# Patient Record
Sex: Male | Born: 1965 | Hispanic: No | Marital: Single | Smoking: Current every day smoker
Health system: Southern US, Community
[De-identification: ages and names within clinical notes are randomized; demographics above are authoritative.]

## PROBLEM LIST (undated history)

## (undated) DIAGNOSIS — J45909 Unspecified asthma, uncomplicated: Secondary | ICD-10-CM

## (undated) DIAGNOSIS — I10 Essential (primary) hypertension: Secondary | ICD-10-CM

## (undated) DIAGNOSIS — S069XAA Unspecified intracranial injury with loss of consciousness status unknown, initial encounter: Secondary | ICD-10-CM

## (undated) DIAGNOSIS — F1010 Alcohol abuse, uncomplicated: Secondary | ICD-10-CM

## (undated) DIAGNOSIS — S069X9A Unspecified intracranial injury with loss of consciousness of unspecified duration, initial encounter: Secondary | ICD-10-CM

## (undated) DIAGNOSIS — E1165 Type 2 diabetes mellitus with hyperglycemia: Secondary | ICD-10-CM

## (undated) HISTORY — DX: Unspecified intracranial injury with loss of consciousness of unspecified duration, initial encounter: S06.9X9A

## (undated) HISTORY — DX: Essential (primary) hypertension: I10

## (undated) HISTORY — DX: Unspecified intracranial injury with loss of consciousness status unknown, initial encounter: S06.9XAA

## (undated) HISTORY — DX: Type 2 diabetes mellitus with hyperglycemia: E11.65

## (undated) HISTORY — DX: Unspecified asthma, uncomplicated: J45.909

---

## 1997-10-02 HISTORY — PX: BRAIN SURGERY: SHX531

## 1998-06-03 ENCOUNTER — Encounter: Payer: Self-pay | Admitting: Emergency Medicine

## 1998-06-03 ENCOUNTER — Inpatient Hospital Stay (HOSPITAL_COMMUNITY): Admission: EM | Admit: 1998-06-03 | Discharge: 1998-07-01 | Payer: Self-pay | Admitting: Emergency Medicine

## 1998-06-04 ENCOUNTER — Encounter: Payer: Self-pay | Admitting: Plastic Surgery

## 1998-06-05 ENCOUNTER — Encounter: Payer: Self-pay | Admitting: Critical Care Medicine

## 1998-06-06 ENCOUNTER — Encounter: Payer: Self-pay | Admitting: Neurological Surgery

## 1998-06-11 ENCOUNTER — Encounter: Payer: Self-pay | Admitting: Pulmonary Disease

## 1998-06-15 ENCOUNTER — Encounter: Payer: Self-pay | Admitting: Pulmonary Disease

## 1998-06-16 ENCOUNTER — Encounter: Payer: Self-pay | Admitting: Pulmonary Disease

## 1998-06-23 ENCOUNTER — Encounter: Payer: Self-pay | Admitting: Pulmonary Disease

## 1998-06-27 ENCOUNTER — Encounter: Payer: Self-pay | Admitting: Pulmonary Disease

## 1998-07-01 ENCOUNTER — Inpatient Hospital Stay (HOSPITAL_COMMUNITY)
Admission: RE | Admit: 1998-07-01 | Discharge: 1998-08-10 | Payer: Self-pay | Admitting: Physical Medicine and Rehabilitation

## 1998-07-21 ENCOUNTER — Encounter: Payer: Self-pay | Admitting: Physical Medicine and Rehabilitation

## 1998-07-22 ENCOUNTER — Encounter: Payer: Self-pay | Admitting: Physical Medicine and Rehabilitation

## 1998-10-18 ENCOUNTER — Encounter: Admission: RE | Admit: 1998-10-18 | Discharge: 1998-12-10 | Payer: Self-pay

## 1998-12-10 ENCOUNTER — Encounter: Admission: RE | Admit: 1998-12-10 | Discharge: 1999-03-10 | Payer: Self-pay

## 1999-12-22 ENCOUNTER — Encounter: Admission: RE | Admit: 1999-12-22 | Discharge: 2000-03-21 | Payer: Self-pay | Admitting: Neurosurgery

## 2000-01-20 ENCOUNTER — Encounter
Admission: RE | Admit: 2000-01-20 | Discharge: 2000-04-19 | Payer: Self-pay | Admitting: Physical Medicine and Rehabilitation

## 2001-06-16 ENCOUNTER — Emergency Department (HOSPITAL_COMMUNITY): Admission: EM | Admit: 2001-06-16 | Discharge: 2001-06-16 | Payer: Self-pay | Admitting: Emergency Medicine

## 2006-01-31 ENCOUNTER — Encounter: Payer: Self-pay | Admitting: Emergency Medicine

## 2008-10-02 DIAGNOSIS — E1165 Type 2 diabetes mellitus with hyperglycemia: Secondary | ICD-10-CM

## 2008-10-02 DIAGNOSIS — IMO0002 Reserved for concepts with insufficient information to code with codable children: Secondary | ICD-10-CM

## 2008-10-02 HISTORY — DX: Reserved for concepts with insufficient information to code with codable children: IMO0002

## 2008-10-02 HISTORY — DX: Type 2 diabetes mellitus with hyperglycemia: E11.65

## 2012-10-02 HISTORY — PX: BUNIONECTOMY: SHX129

## 2015-11-05 ENCOUNTER — Telehealth: Payer: Self-pay | Admitting: Internal Medicine

## 2015-11-05 NOTE — Telephone Encounter (Signed)
APPT REMINDER CALL, NON WORKING NUMBER

## 2015-11-08 ENCOUNTER — Ambulatory Visit (INDEPENDENT_AMBULATORY_CARE_PROVIDER_SITE_OTHER): Payer: Medicaid Other | Admitting: Internal Medicine

## 2015-11-08 ENCOUNTER — Encounter: Payer: Self-pay | Admitting: Internal Medicine

## 2015-11-08 VITALS — BP 167/98 | HR 106 | Temp 97.9°F | Resp 18 | Ht 73.0 in | Wt 231.9 lb

## 2015-11-08 DIAGNOSIS — S069XAA Unspecified intracranial injury with loss of consciousness status unknown, initial encounter: Secondary | ICD-10-CM | POA: Insufficient documentation

## 2015-11-08 DIAGNOSIS — E119 Type 2 diabetes mellitus without complications: Secondary | ICD-10-CM | POA: Insufficient documentation

## 2015-11-08 DIAGNOSIS — E1165 Type 2 diabetes mellitus with hyperglycemia: Secondary | ICD-10-CM | POA: Diagnosis present

## 2015-11-08 DIAGNOSIS — G4700 Insomnia, unspecified: Secondary | ICD-10-CM | POA: Diagnosis not present

## 2015-11-08 DIAGNOSIS — Z7984 Long term (current) use of oral hypoglycemic drugs: Secondary | ICD-10-CM

## 2015-11-08 DIAGNOSIS — S069X9A Unspecified intracranial injury with loss of consciousness of unspecified duration, initial encounter: Secondary | ICD-10-CM | POA: Insufficient documentation

## 2015-11-08 DIAGNOSIS — Z79899 Other long term (current) drug therapy: Secondary | ICD-10-CM

## 2015-11-08 DIAGNOSIS — Z794 Long term (current) use of insulin: Secondary | ICD-10-CM

## 2015-11-08 DIAGNOSIS — E785 Hyperlipidemia, unspecified: Secondary | ICD-10-CM | POA: Diagnosis not present

## 2015-11-08 DIAGNOSIS — I10 Essential (primary) hypertension: Secondary | ICD-10-CM | POA: Diagnosis not present

## 2015-11-08 DIAGNOSIS — S069X9S Unspecified intracranial injury with loss of consciousness of unspecified duration, sequela: Secondary | ICD-10-CM

## 2015-11-08 DIAGNOSIS — E118 Type 2 diabetes mellitus with unspecified complications: Secondary | ICD-10-CM

## 2015-11-08 DIAGNOSIS — E1159 Type 2 diabetes mellitus with other circulatory complications: Secondary | ICD-10-CM | POA: Insufficient documentation

## 2015-11-08 LAB — GLUCOSE, CAPILLARY: Glucose-Capillary: 268 mg/dL — ABNORMAL HIGH (ref 65–99)

## 2015-11-08 LAB — POCT GLYCOSYLATED HEMOGLOBIN (HGB A1C): Hemoglobin A1C: 9.2

## 2015-11-08 MED ORDER — OMEPRAZOLE 20 MG PO CPDR: 20.0000 mg | DELAYED_RELEASE_CAPSULE | Freq: Every day | ORAL | Status: DC

## 2015-11-08 MED ORDER — AMITRIPTYLINE HCL 100 MG PO TABS: 100.0000 mg | ORAL_TABLET | Freq: Every day | ORAL | Status: DC

## 2015-11-08 MED ORDER — METFORMIN HCL 1000 MG PO TABS: 1000.0000 mg | ORAL_TABLET | Freq: Two times a day (BID) | ORAL | Status: DC

## 2015-11-08 MED ORDER — GEMFIBROZIL 600 MG PO TABS: 600.0000 mg | ORAL_TABLET | Freq: Two times a day (BID) | ORAL | Status: DC

## 2015-11-08 MED ORDER — INSULIN PEN NEEDLE 31G X 5 MM MISC: 1.0000 [IU] | Status: DC

## 2015-11-08 MED ORDER — ENALAPRIL MALEATE 10 MG PO TABS: 10.0000 mg | ORAL_TABLET | Freq: Every day | ORAL | Status: DC

## 2015-11-08 MED ORDER — CANAGLIFLOZIN 100 MG PO TABS: 100.0000 mg | ORAL_TABLET | Freq: Every day | ORAL | Status: DC

## 2015-11-08 MED ORDER — INSULIN ASPART PROT & ASPART (70-30 MIX) 100 UNIT/ML PEN: 30.0000 [IU] | PEN_INJECTOR | Freq: Two times a day (BID) | SUBCUTANEOUS | Status: DC

## 2015-11-08 MED ORDER — GLUCOSE BLOOD VI STRP: ORAL_STRIP | Status: DC

## 2015-11-08 MED ORDER — ACCU-CHEK SOFTCLIX LANCET DEV MISC: Status: DC

## 2015-11-08 NOTE — Patient Instructions (Signed)
General Instructions:   Please bring your medicines with you each time you come to clinic.  Medicines may include prescription medications, over-the-counter medications, herbal remedies, eye drops, vitamins, or other pills.   Progress Toward Treatment Goals:  No flowsheet data found.  Self Care Goals & Plans:  No flowsheet data found.  No flowsheet data found.   Care Management & Community Referrals:  No flowsheet data found.    Canagliflozin oral tablets What is this medicine? CANAGLIFLOZIN (KAN a gli FLOE zin) helps to treat type 2 diabetes. It helps to control blood sugar. Treatment is combined with diet and exercise. This medicine may be used for other purposes; ask your health care provider or pharmacist if you have questions. What should I tell my health care provider before I take this medicine? They need to know if you have any of these conditions: -dehydration -diabetic ketoacidosis -diet low in salt -eating less due to illness, surgery, dieting, or any other reason -having surgery -high cholesterol -high levels of potassium in the blood -history of pancreatitis or pancreas problems -history of yeast infection of the penis or vagina -if you often drink alcohol -infections in the bladder, kidneys, or urinary tract -kidney disease -liver disease -low blood pressure -on hemodialysis -problems urinating -type 1 diabetes -uncircumcised male -an unusual or allergic reaction to canagliflozin, other medicines, foods, dyes, or preservatives -pregnant or trying to get pregnant -breast-feeding How should I use this medicine? Take this medicine by mouth with a glass of water. Follow the directions on the prescription label. Take it before the first meal of the day. Take your dose at the same time each day. Do not take more often than directed. Do not stop taking except on your doctor's advice. A special MedGuide will be given to you by the pharmacist with each  prescription and refill. Be sure to read this information carefully each time. Talk to your pediatrician regarding the use of this medicine in children. Special care may be needed. Overdosage: If you think you have taken too much of this medicine contact a poison control center or emergency room at once. NOTE: This medicine is only for you. Do not share this medicine with others. What if I miss a dose? If you miss a dose, take it as soon as you can. If it is almost time for your next dose, take only that dose. Do not take double or extra doses. What may interact with this medicine? Do not take this medicine with any of the following medications: -gatifloxacinThis medicine may also interact with the following medications: -alcohol -certain medicines for blood pressure, heart disease -digoxin -diuretics -insulin -nateglinide -phenobarbital -phenytoin -repaglinide -rifampin -ritonavir -sulfonylureas like glimepiride, glipizide, glyburide This list may not describe all possible interactions. Give your health care provider a list of all the medicines, herbs, non-prescription drugs, or dietary supplements you use. Also tell them if you smoke, drink alcohol, or use illegal drugs. Some items may interact with your medicine. What should I watch for while using this medicine? Visit your doctor or health care professional for regular checks on your progress. This medicine can cause a serious condition in which there is too much acid in the blood. If you develop nausea, vomiting, stomach pain, unusual tiredness, or breathing problems, stop taking this medicine and call your doctor right away. If possible, use a ketone dipstick to check for ketones in your urine. A test called the HbA1C (A1C) will be monitored. This is a simple blood test. It  measures your blood sugar control over the last 2 to 3 months. You will receive this test every 3 to 6 months. Learn how to check your blood sugar. Learn the  symptoms of low and high blood sugar and how to manage them. Always carry a quick-source of sugar with you in case you have symptoms of low blood sugar. Examples include hard sugar candy or glucose tablets. Make sure others know that you can choke if you eat or drink when you develop serious symptoms of low blood sugar, such as seizures or unconsciousness. They must get medical help at once. Tell your doctor or health care professional if you have high blood sugar. You might need to change the dose of your medicine. If you are sick or exercising more than usual, you might need to change the dose of your medicine. Do not skip meals. Ask your doctor or health care professional if you should avoid alcohol. Many nonprescription cough and cold products contain sugar or alcohol. These can affect blood sugar. Wear a medical ID bracelet or chain, and carry a card that describes your disease and details of your medicine and dosage times. What side effects may I notice from receiving this medicine? Side effects that you should report to your doctor or health care professional as soon as possible: -allergic reactions like skin rash, itching or hives, swelling of the face, lips, or tongue -breathing problems -chest pain -dizziness -fast or irregular heartbeat -feeling faint or lightheaded, falls -muscle weakness -nausea, vomiting, unusual stomach upset or pain -new pain or tenderness, change in skin color, sores or ulcers, or infection in legs or feet -signs and symptoms of low blood sugar such as feeling anxious, confusion, dizziness, increased hunger, unusually weak or tired, sweating, shakiness, cold, irritable, headache, blurred vision, fast heartbeat, loss of consciousness -signs and symptoms of a urinary tract infection, such as fever, chills, a burning feeling when urinating, blood in the urine, back pain -trouble passing urine or change in the amount of urine, including an urgent need to urinate more  often, in larger amounts, or at night -penile discharge, itching, or pain in men -unusual tiredness -vaginal discharge, itching, or odor in women Side effects that usually do not require medical attention (Report these to your doctor or health care professional if they continue or are bothersome.): -constipation -mild increase in urination -thirsty This list may not describe all possible side effects. Call your doctor for medical advice about side effects. You may report side effects to FDA at 1-800-FDA-1088. Where should I keep my medicine? Keep out of the reach of children. Store at room temperature between 20 and 25 degrees C (68 and 77 degrees F). Throw away any unused medicine after the expiration date. NOTE: This sheet is a summary. It may not cover all possible information. If you have questions about this medicine, talk to your doctor, pharmacist, or health care provider.    2016, Elsevier/Gold Standard. (2015-02-17 14:33:36)

## 2015-11-08 NOTE — Progress Notes (Signed)
[facility] Subjective:   Patient ID: Ruben Pierce male   DOB: May 04, 1966 50 y.o.   MRN: [id number]  HPI: Ruben Pierce is a 50 y.o. male with a PMH detailed below who presents to establish care with a new PCP.  He has recently moved to [place] from [place] to live with his sister.  Please see problem based charting below for the status of his chronic medical problems.    Past Medical History  Diagnosis Date  . TBI (traumatic brain injury) (HCC)   . Type 2 diabetes mellitus, uncontrolled (HCC) 2010  . Asthma   . Hypertension    Current Outpatient Prescriptions  Medication Sig Dispense Refill  . amitriptyline (ELAVIL) 100 MG tablet Take 1 tablet (100 mg total) by mouth at bedtime. 90 tablet 0  . enalapril (VASOTEC) 10 MG tablet Take 1 tablet (10 mg total) by mouth daily. 90 tablet 1  . gemfibrozil (LOPID) 600 MG tablet Take 1 tablet (600 mg total) by mouth 2 (two) times daily before a meal. 180 tablet 11  . insulin NPH-regular Human (NOVOLIN 70/30) (70-30) 100 UNIT/ML injection Inject 25-30 Units into the skin.    . meloxicam (MOBIC) 15 MG tablet Take 15 mg by mouth daily.    . metFORMIN (GLUCOPHAGE) 1000 MG tablet Take 1 tablet (1,000 mg total) by mouth 2 (two) times daily with a meal. 180 tablet 1  . omeprazole (PRILOSEC) 20 MG capsule Take 1 capsule (20 mg total) by mouth daily. 90 capsule 1  . canagliflozin (INVOKANA) 100 MG TABS tablet Take 1 tablet (100 mg total) by mouth daily before breakfast. 30 tablet 0  . glucose blood (ACCU-CHEK AVIVA) test strip Use as instructed 100 each 12  . insulin aspart protamine - aspart (NOVOLOG MIX 70/30 FLEXPEN) (70-30) 100 UNIT/ML FlexPen Inject 0.3 mLs (30 Units total) into the skin 2 (two) times daily with a meal. 15 mL 11  . Insulin Pen Needle 31G X 5 MM MISC 1 Units by Does not apply route as directed. 100 each 11  . Lancet Devices (ACCU-CHEK SOFTCLIX) lancets Use as instructed 1 each 0   No current  facility-administered medications for this visit.   Family History  Problem Relation Age of Onset  . Diabetes Mellitus II Father   . Hypertension Father   . Hyperlipidemia Father   . CAD Mother    Social History   Social History  . Marital Status: Single    Spouse Name: N/A  . Number of Children: N/A  . Years of Education: 9th grade   Occupational History  . diabled    Social History Main Topics  . Smoking status: Former Games developer  . Smokeless tobacco: Never Used  . Alcohol Use: No  . Drug Use: None  . Sexual Activity: Not Asked   Other Topics Concern  . None   Social History Narrative   Previously worked as Designer, industrial/product, in Sept 6th 1999 fell 25 feet and landed on his head had resultant TBI.  Now is cared for by his Sister.   Review of Systems: Review of Systems  Constitutional: Negative for fever and malaise/fatigue.  Eyes: Negative for blurred vision.  Respiratory: Negative for shortness of breath.   Cardiovascular: Negative for chest pain.  Gastrointestinal: Negative for abdominal pain.  Genitourinary: Negative for frequency.  Endo/Heme/Allergies: Negative for polydipsia.  All other systems reviewed and are negative.    Objective:  Physical Exam: Filed Vitals:   11/08/15 1521  BP:  167/98  Pulse: 106  Temp: 97.9 F (36.6 C)  TempSrc: Oral  Resp: 18  Height:  (1.854 m)  Weight: 231 lb 14.4 oz (105.189 kg)  SpO2: 100%  Physical Exam  Constitutional: He is well-developed, well-nourished, and in no distress.  HENT:  Mouth/Throat: Oropharynx is clear and moist.  Eyes: Conjunctivae are normal.  Cardiovascular: Normal rate and regular rhythm.   Pulmonary/Chest: Effort normal and breath sounds normal.  Abdominal: Soft. Bowel sounds are normal.  Musculoskeletal: He exhibits no edema.  Skin: Skin is warm and dry.  Psychiatric: Affect normal.  Nursing note and vitals reviewed.   Assessment & Plan:  Case discussed with Dr. Heide Spark  Diabetes  [facility]) HPI:  He was diagnosed with diabeties a number of years ago, he is not sure how long.  He does not have any known complications of diabetes.  He currently takes Novolin 70/30 30 units in AM and 25 units in PM as well as metformin 1 G BID.  He checks his sugars 3 times a day.  His sister is trying to help with managing his diabeites but she is confused as his regimen is different than their fathers who also had diabetes. He brings his meter which shows a great deal of fluctuation from a low of 87 to a high of 479 with an average of 211.  A: Uncontrolled Type 2 DM without complication  P: - Continue Novolog Flexpen 70/30, recommend 30 units BID, I feel a pen will be much easier for them to manage and eventually titrate. - Continue Metformin 1g BID - Add invokana  daily (increase to 300 at next visit) (he is on max dose of metformin and still not at goal) - Referral for diabetic education - Retinal scan - Microalbumin/Cr and BMP -Recommend initial A1c goal of 7 pending further evaluation and diabetic teaching, we may revise this to a 6.5 depending diabetic education  Essential hypertension HPI: He has a history of HTN, he takes enalapril  daily but has been out recently  A: Essential HTn not at goal  P: Refill enalapril  daily - May need second agent if not at goal at follow up (invokana may also provide some benefit as a diuretic)  Insomnia HPI: He has been taking  of amitriptyline QHS for insomnia  A: Insomnia  P: I feel uncomfortable with the  dose, I would prefer the lower dose of  QHS.  Hyperlipidemia HPI: he has been taking gemfibrozil for HLD  A: HLD  P: Refill gemfibrozil, will get a fasting lipid panel in the future to assess his triglycerides.    Medications Ordered Meds ordered this encounter  Medications  . DISCONTD: gemfibrozil (LOPID) 600 MG tablet    Sig: Take 600 mg by mouth 2 (two) times daily before a meal.  . DISCONTD:  omeprazole (PRILOSEC) 20 MG capsule    Sig: Take 20 mg by mouth daily.  Marland Kitchen DISCONTD: metFORMIN (GLUCOPHAGE) 1000 MG tablet    Sig: Take 1,000 mg by mouth 2 (two) times daily with a meal.  . meloxicam (MOBIC) 15 MG tablet    Sig: Take 15 mg by mouth daily.  Marland Kitchen DISCONTD: enalapril (VASOTEC) 10 MG tablet    Sig: Take 10 mg by mouth daily.  Marland Kitchen DISCONTD: amitriptyline (ELAVIL) 100 MG tablet    Sig: Take 200 mg by mouth at bedtime.  . insulin NPH-regular Human (NOVOLIN 70/30) (70-30) 100 UNIT/ML injection    Sig: Inject 25-30 Units into the skin.  Marland Kitchen  Lancet Devices (ACCU-CHEK SOFTCLIX) lancets    Sig: Use as instructed    Dispense:  1 each    Refill:  0  . glucose blood (ACCU-CHEK AVIVA) test strip    Sig: Use as instructed    Dispense:  100 each    Refill:  12    The patient is insulin requiring, ICD 10 code E11.65. The patient tests 3 times per day.  . Insulin Pen Needle 31G X 5 MM MISC    Sig: 1 Units by Does not apply route as directed.    Dispense:  100 each    Refill:  11    The patient is insulin requiring, ICD 10 code E11.65.  Marland Kitchen insulin aspart protamine - aspart (NOVOLOG MIX 70/30 FLEXPEN) (70-30) 100 UNIT/ML FlexPen    Sig: Inject 0.3 mLs (30 Units total) into the skin 2 (two) times daily with a meal.    Dispense:  15 mL    Refill:  11  . enalapril (VASOTEC) 10 MG tablet    Sig: Take 1 tablet (10 mg total) by mouth daily.    Dispense:  90 tablet    Refill:  1  . metFORMIN (GLUCOPHAGE) 1000 MG tablet    Sig: Take 1 tablet (1,000 mg total) by mouth 2 (two) times daily with a meal.    Dispense:  180 tablet    Refill:  1  . gemfibrozil (LOPID) 600 MG tablet    Sig: Take 1 tablet (600 mg total) by mouth 2 (two) times daily before a meal.    Dispense:  180 tablet    Refill:  11  . omeprazole (PRILOSEC) 20 MG capsule    Sig: Take 1 capsule (20 mg total) by mouth daily.    Dispense:  90 capsule    Refill:  1  . canagliflozin (INVOKANA) 100 MG TABS tablet    Sig: Take 1 tablet  (100 mg total) by mouth daily before breakfast.    Dispense:  30 tablet    Refill:  0  . amitriptyline (ELAVIL) 100 MG tablet    Sig: Take 1 tablet (100 mg total) by mouth at bedtime.    Dispense:  90 tablet    Refill:  0   Other Orders Orders Placed This Encounter  Procedures  . Glucose, capillary  . Microalbumin / Creatinine Urine Ratio  . BMP8+Anion Gap  . CBC no Diff  . Ambulatory referral to diabetic education    Referral Priority:  Routine    Referral Type:  Consultation    Referral Reason:  Specialty Services Required    Referred to Provider:  Cecil Cranker Plyler, RD    Number of Visits Requested:  1  . POC Hbg A1C  . Retinal/fundus photography    Standing Status: Future     Number of Occurrences:      Standing Expiration Date: 11/07/2016   Follow Up: Return in about 1 month (around 12/06/2015).

## 2015-11-09 LAB — BMP8+ANION GAP
Anion Gap: 22 mmol/L — ABNORMAL HIGH (ref 10.0–18.0)
BUN / CREAT RATIO: 14 (ref 9–20)
BUN: 13 mg/dL (ref 6–24)
CO2: 19 mmol/L (ref 18–29)
Calcium: 9.7 mg/dL (ref 8.7–10.2)
Chloride: 97 mmol/L (ref 96–106)
Creatinine, Ser: 0.91 mg/dL (ref 0.76–1.27)
GFR calc non Af Amer: 99 mL/min/{1.73_m2} (ref >59)
GFR, EST AFRICAN AMERICAN: 115 mL/min/{1.73_m2} (ref >59)
GLUCOSE: 220 mg/dL — AB (ref 65–99)
Potassium: 4.8 mmol/L (ref 3.5–5.2)
SODIUM: 138 mmol/L (ref 134–144)

## 2015-11-09 LAB — MICROALBUMIN / CREATININE URINE RATIO
CREATININE, UR: 26.4 mg/dL
MICROALB/CREAT RATIO: 19.7 mg/g creat (ref 0.0–30.0)
MICROALBUM., U, RANDOM: 5.2 ug/mL

## 2015-11-09 LAB — CBC
HEMATOCRIT: 36.2 % — AB (ref 37.5–51.0)
Hemoglobin: 11.9 g/dL — ABNORMAL LOW (ref 12.6–17.7)
MCH: 23.6 pg — ABNORMAL LOW (ref 26.6–33.0)
MCHC: 32.9 g/dL (ref 31.5–35.7)
MCV: 72 fL — AB (ref 79–97)
PLATELETS: 311 10*3/uL (ref 150–379)
RBC: 5.04 x10E6/uL (ref 4.14–5.80)
RDW: 17.4 % — AB (ref 12.3–15.4)
WBC: 7.6 10*3/uL (ref 3.4–10.8)

## 2015-11-11 ENCOUNTER — Telehealth: Payer: Self-pay | Admitting: Internal Medicine

## 2015-11-11 DIAGNOSIS — G4700 Insomnia, unspecified: Secondary | ICD-10-CM | POA: Insufficient documentation

## 2015-11-11 DIAGNOSIS — E785 Hyperlipidemia, unspecified: Secondary | ICD-10-CM

## 2015-11-11 DIAGNOSIS — E1169 Type 2 diabetes mellitus with other specified complication: Secondary | ICD-10-CM | POA: Insufficient documentation

## 2015-11-11 NOTE — Telephone Encounter (Signed)
There is no rush for him to start, I would just recommend he wait till they get it restocked or transfer his Rx to a different pharmacy.

## 2015-11-11 NOTE — Assessment & Plan Note (Signed)
HPI: he has been taking gemfibrozil for HLD  A: HLD  P: Refill gemfibrozil, will get a fasting lipid panel in the future to assess his triglycerides.

## 2015-11-11 NOTE — Telephone Encounter (Signed)
CANAGLISLOZIN CALLED TO [facility] [phone] BUT THEY DO NOT HAVE IN STOCK.

## 2015-11-11 NOTE — Assessment & Plan Note (Addendum)
HPI: He has a history of HTN, he takes enalapril  daily but has been out recently  A: Essential HTn not at goal  P: Refill enalapril  daily - May need second agent if not at goal at follow up (invokana may also provide some benefit as a diuretic)

## 2015-11-11 NOTE — Assessment & Plan Note (Signed)
HPI: He has been taking  of amitriptyline QHS for insomnia  A: Insomnia  P: I feel uncomfortable with the  dose, I would prefer the lower dose of  QHS.

## 2015-11-11 NOTE — Assessment & Plan Note (Deleted)
HPI:  He was diagnosed with diabeties a number of years ago, he is not sure how long.  He does not have any known complications of diabetes.  He currently takes Novolog 70/30 30 units in AM and 25 units in PM as well as metformin 1 G BID.  He checks his sugars 3 times a day.  His sister is trying to help with managing his diabeites but she is confused as his regimen is different than their fathers who also had diabetes. He brings his meter which shows a great deal of fluctuation from a low of 87 to a high of 479 with an average of 211.  A: Uncontrolled Type 2 DM without complication  P: - Continue Novlog 70/30, recommend 30 units BID - Continue Metformin 1g BID - Add invokana  daily (increase to 300 at next visit) - Referral for diabetic education - Retinal scan - Microalbumin/Cr and BMP

## 2015-11-11 NOTE — Assessment & Plan Note (Addendum)
HPI:  He was diagnosed with diabeties a number of years ago, he is not sure how long.  He does not have any known complications of diabetes.  He currently takes Novolin 70/30 30 units in AM and 25 units in PM as well as metformin 1 G BID.  He checks his sugars 3 times a day.  His sister is trying to help with managing his diabeites but she is confused as his regimen is different than their fathers who also had diabetes. He brings his meter which shows a great deal of fluctuation from a low of 87 to a high of 479 with an average of 211.  A: Uncontrolled Type 2 DM without complication  P: - Continue Novolog Flexpen 70/30, recommend 30 units BID, I feel a pen will be much easier for them to manage and eventually titrate. - Continue Metformin 1g BID - Add invokana  daily (increase to 300 at next visit) (he is on max dose of metformin and still not at goal) - Referral for diabetic education - Retinal scan - Microalbumin/Cr and BMP -Recommend initial A1c goal of 7 pending further evaluation and diabetic teaching, we may revise this to a 6.5 depending diabetic education

## 2015-11-15 NOTE — Progress Notes (Signed)
Internal Medicine Clinic Attending  Case discussed with Dr. Hoffman soon after the resident saw the patient.  We reviewed the resident's history and exam and pertinent patient test results.  I agree with the assessment, diagnosis, and plan of care documented in the resident's note. 

## 2015-12-09 ENCOUNTER — Encounter: Payer: Medicaid Other | Admitting: Dietician

## 2015-12-14 NOTE — Addendum Note (Signed)
Addended by: Neomia DearPOWERS, Argil Mahl E on: 12/14/2015 07:28 PM   Modules accepted: Orders

## 2016-01-08 ENCOUNTER — Other Ambulatory Visit: Payer: Self-pay | Admitting: Internal Medicine

## 2016-01-21 ENCOUNTER — Telehealth: Payer: Self-pay | Admitting: Internal Medicine

## 2016-01-21 ENCOUNTER — Other Ambulatory Visit: Payer: Self-pay | Admitting: Internal Medicine

## 2016-01-21 NOTE — Telephone Encounter (Signed)
APPT. REMINDER CALL, NO ANSWER, NO VOICE MAIL °

## 2016-01-24 ENCOUNTER — Ambulatory Visit (INDEPENDENT_AMBULATORY_CARE_PROVIDER_SITE_OTHER): Payer: Medicaid Other | Admitting: Internal Medicine

## 2016-01-24 ENCOUNTER — Encounter: Payer: Self-pay | Admitting: Internal Medicine

## 2016-01-24 ENCOUNTER — Other Ambulatory Visit: Payer: Self-pay | Admitting: Internal Medicine

## 2016-01-24 VITALS — BP 130/70 | HR 97 | Temp 98.2°F | Ht 73.0 in | Wt 228.9 lb

## 2016-01-24 DIAGNOSIS — F17210 Nicotine dependence, cigarettes, uncomplicated: Secondary | ICD-10-CM

## 2016-01-24 DIAGNOSIS — R5383 Other fatigue: Secondary | ICD-10-CM | POA: Insufficient documentation

## 2016-01-24 DIAGNOSIS — G4700 Insomnia, unspecified: Secondary | ICD-10-CM

## 2016-01-24 DIAGNOSIS — D509 Iron deficiency anemia, unspecified: Secondary | ICD-10-CM | POA: Insufficient documentation

## 2016-01-24 DIAGNOSIS — Z794 Long term (current) use of insulin: Secondary | ICD-10-CM

## 2016-01-24 DIAGNOSIS — E1165 Type 2 diabetes mellitus with hyperglycemia: Secondary | ICD-10-CM

## 2016-01-24 DIAGNOSIS — I10 Essential (primary) hypertension: Secondary | ICD-10-CM

## 2016-01-24 DIAGNOSIS — R0602 Shortness of breath: Secondary | ICD-10-CM

## 2016-01-24 DIAGNOSIS — Z720 Tobacco use: Secondary | ICD-10-CM | POA: Insufficient documentation

## 2016-01-24 DIAGNOSIS — F17200 Nicotine dependence, unspecified, uncomplicated: Secondary | ICD-10-CM | POA: Insufficient documentation

## 2016-01-24 DIAGNOSIS — I208 Other forms of angina pectoris: Secondary | ICD-10-CM | POA: Insufficient documentation

## 2016-01-24 DIAGNOSIS — I209 Angina pectoris, unspecified: Secondary | ICD-10-CM

## 2016-01-24 DIAGNOSIS — J4540 Moderate persistent asthma, uncomplicated: Secondary | ICD-10-CM | POA: Insufficient documentation

## 2016-01-24 DIAGNOSIS — F1010 Alcohol abuse, uncomplicated: Secondary | ICD-10-CM

## 2016-01-24 DIAGNOSIS — J449 Chronic obstructive pulmonary disease, unspecified: Secondary | ICD-10-CM | POA: Insufficient documentation

## 2016-01-24 DIAGNOSIS — E0800 Diabetes mellitus due to underlying condition with hyperosmolarity without nonketotic hyperglycemic-hyperosmolar coma (NKHHC): Secondary | ICD-10-CM

## 2016-01-24 DIAGNOSIS — Z114 Encounter for screening for human immunodeficiency virus [HIV]: Secondary | ICD-10-CM | POA: Insufficient documentation

## 2016-01-24 LAB — GLUCOSE, CAPILLARY: Glucose-Capillary: 230 mg/dL — ABNORMAL HIGH (ref 65–99)

## 2016-01-24 MED ORDER — NICOTINE POLACRILEX 4 MG MT GUM
4.0000 mg | CHEWING_GUM | OROMUCOSAL | Status: DC | PRN
Start: 2016-01-24 — End: 2017-03-07

## 2016-01-24 MED ORDER — ALBUTEROL SULFATE HFA 108 (90 BASE) MCG/ACT IN AERS: 2.0000 | INHALATION_SPRAY | Freq: Four times a day (QID) | RESPIRATORY_TRACT | Status: DC | PRN

## 2016-01-24 MED ORDER — MELATONIN 1 MG PO TABS: 1.0000 | ORAL_TABLET | Freq: Every evening | ORAL | Status: DC

## 2016-01-24 MED ORDER — INSULIN ASPART PROT & ASPART (70-30 MIX) 100 UNIT/ML PEN: PEN_INJECTOR | SUBCUTANEOUS | Status: DC

## 2016-01-24 MED ORDER — ACCU-CHEK SOFTCLIX LANCETS MISC: Status: DC

## 2016-01-24 MED ORDER — PANTOPRAZOLE SODIUM 40 MG PO TBEC: 40.0000 mg | DELAYED_RELEASE_TABLET | Freq: Every day | ORAL | Status: DC

## 2016-01-24 MED ORDER — CANAGLIFLOZIN 100 MG PO TABS: 300.0000 mg | ORAL_TABLET | Freq: Every day | ORAL | Status: DC

## 2016-01-24 MED ORDER — GLUCOSE BLOOD VI STRP: ORAL_STRIP | Status: DC

## 2016-01-24 MED ORDER — NICOTINE 7 MG/24HR TD PT24: 7.0000 mg | MEDICATED_PATCH | Freq: Every day | TRANSDERMAL | Status: DC

## 2016-01-24 NOTE — Assessment & Plan Note (Signed)
His last a1c was 9.2 in March 2017. His afternoon sugars have averaged 180 and his morning sugars have averaged 160. I've asked him to increase his 70/30 to 33 units in the morning and 26 units in the evening. I've also re-filled his canagliflozin and increased this to 300mg  daily. He hasn't had any dysuria or problems taking this medication. I'll see him back in 2 weeks to re-check his meter and up-titrate his insulin if necessary.

## 2016-01-24 NOTE — Assessment & Plan Note (Signed)
His blood pressure is well-controlled on enalapril 10mg  daily so we'll continue this for now.

## 2016-01-24 NOTE — Assessment & Plan Note (Signed)
He is requesting HIV and hepatitis C screening. I offered this today but he left the clinic without getting his blood drawn. We'll get these labs next visit.

## 2016-01-24 NOTE — Assessment & Plan Note (Signed)
At the next visit, I will send off a ferritin. If he truly has iron-deficiency anemia, I will refer him for a colonoscopy to rule out colorectal carcinoma.

## 2016-01-24 NOTE — Assessment & Plan Note (Signed)
He's currently smoking 4 cigarettes daily and is ready to quit. I've prescribed him a nicotine patch and gum. We'll follow this up at the next visit.  I spent 5 minutes educating him on the importance of smoking cessation and pharmacologic options.

## 2016-01-24 NOTE — Patient Instructions (Signed)
[name],  It was great meeting you today.  We talked about a few things today:  1. Your sugars are still high, so I recommend increasing your insulin to 33 units in the morning and 26 units in the evening. I've sent you more lancets and strips so you can continue checking them. I've also refilled your canagliflozin.  2. I think your chest pain is probably from acid reflux, so I've written you a prescription for "pantoprazole" that will hopefully help decrease the acid production in your stomach. If you continue to have chest pain after 2 weeks, this may be your heart, so we can consider getting a stress test when I see you again.  3. For your difficulty sleeping, I've prescribed you melatonin to help. I've also given you some tips below:  4. Quitting smoking is the most important thing you can do for your health. I've prescribed you a patch to wear daily and gum, to chew when you feel the urge to smoke.  We'll see you back in 3 weeks, Dr. Earnest ConroyFlores  Tips to help you sleep Maintain a regular sleep routine . Go to bed at the same time. Wake up at the same time, even on weekends or holidays.  Have a comfortable pre-bedtime routine . A warm bath, shower . Meditation, or quiet time  Avoid naps  . When we take naps, it decreases the amount of sleep that we need the next night - which may cause difficulty sleeping.  Don't go to bed unless you are sleepy. Don't stay in bed awake for more than 10 - 20 minutes. . If you find your mind racing or worrying about not being able to sleep, get out of bed and sit in a chair in the dark. Do your mind racing in the chair until you are sleepy, then return to bed.  Marland Kitchen. No TV or internet during these periods! That will just keep you awake.  Don't watch TV or read in bed. . When you watch TV or read in bed, you associate the bed with being awake. . Use your bed only for sleep and sex.   Don't eat a large meal before bedtime.  . If you are hungry at  night, eat a light, healthy snack.   Do not drink caffeine inappropriately . The effects of caffeine may last for several hours. Caffeine can cause difficulty sleeping. If you drink caffeine, use it only before noon. . Remember that soda and tea contain caffeine as well.  Avoid inappropriate substances that interfere with sleep . Cigarettes, alcohol, and over-the-counter medications may cause difficulty sleeping.  Exercise regularly . Exercise before 2 pm every day. Exercise promotes good sleep. Marland Kitchen. Avoid rigorous exercise before bedtime as it can cause trouble sleeping.  Have a quiet, comfortable bedroom . Set your bedroom thermostat at a comfortable temperature. Generally, a little cooler is better than a little warmer. . Turn off the TV and other noise that may disrupt sleep. Background 'white noise' like a fan is OK. . If your pets awaken you, keep them outside the bedroom. . Your bedroom should be dark. Turn off bright lights. . If you are a 'clock watcher' at night, hide the clock.

## 2016-01-24 NOTE — Assessment & Plan Note (Signed)
He wakes up easily at night and is hoping for a sleep-aid. He's already getting amytryptiline so I've offered melatonin in addition to giving him numerous sleeping tips.

## 2016-01-24 NOTE — Progress Notes (Signed)
Patient ID: Ruben Pierce, male   DOB: 31-Dec-1965, 50 y.o.   MRN: [id number] [facility] Subjective:   Patient ID: Ruben Pierce male   DOB: 31-Dec-1965 50 y.o.   MRN: [id number]  HPI: Ruben Pierce is a 50 y.o. male with poorly-controlled insulin-dependent type 2 diabetes, hypertension, and gastroesophageal reflux disease here for follow-up of type 2 diabetes, hypertension, and complaint of atypical angina and insomnia.  Type 2 diabetes: He has been taking Novolog 70/30 30 units in the morning and 26 units in the evening. His afternoon sugars have averaged 169 without any lows, and his morning sugars have averaged 174. He's been taking his canagliflozin but recently ran out. He also needs new test strips and lancets.  Hypertension: He has not been checking his blood pressure but has been taking enalapril 40mg  daily as prescribed.  Atypical angina: For the last few years, he has had intermittent sharp left-sided chest pain associated with some shortness of breath that comes when he is sitting down watching television. These episodes typically occur 2-3 hours after eating meals. He denies any worsened symptoms when he lays flat. He further denies any exertional component, associated diaphoresis or nausea.  Insomnia: He feels he wakes up in the middle of the night and is requesting a medication to help him sleep.  I have reviewed his medications with him today and he is smoking 4 cigarettes daily and is interested in quitting.  Review of Systems  Constitutional: Negative for fever and chills.  Respiratory: Positive for shortness of breath. Negative for cough.   Cardiovascular: Positive for chest pain. Negative for palpitations, orthopnea, leg swelling and PND.  Gastrointestinal: Positive for heartburn. Negative for abdominal pain and melena.  Skin: Negative for rash.  Neurological: Negative for headaches.    Objective:  Physical Exam: Filed Vitals:   01/24/16 1444   BP: 153/78  Pulse: 97  Temp: 98.2 F (36.8 C)  TempSrc: Oral  Height: 6\' 1"  (1.854 m)  Weight: 228 lb 14.4 oz (103.828 kg)  SpO2: 100%   General: resting in bed comfortably, appropriately conversational HEENT: no scleral icterus, extra-ocular muscles intact, oropharynx without lesions Cardiac: regular rate and rhythm, no rubs, murmurs or gallops Pulm: breathing well, end-expiratory wheezes throughout all lung fields Abd: bowel sounds normal, soft, nondistended, non-tender Ext: warm and well perfused, without pedal edema Lymph: no cervical or supraclavicular lymphadenopathy Skin: no rash, hair, or nail changes Neuro: alert and oriented X3, cranial nerves II-XII grossly intact, moving all extremities well  Assessment & Plan:  Case discussed with Dr. Criselda PeachesMullen  Diabetes [facility](HCC) His last a1c was 9.2 in March 2017. His afternoon sugars have averaged 180 and his morning sugars have averaged 160. I've asked him to increase his 70/30 to 33 units in the morning and 26 units in the evening. I've also re-filled his canagliflozin and increased this to 300mg  daily. He hasn't had any dysuria or problems taking this medication. I'll see him back in 2 weeks to re-check his meter and up-titrate his insulin if necessary.  Tobacco abuse He's currently smoking 4 cigarettes daily and is ready to quit. I've prescribed him a nicotine patch and gum. We'll follow this up at the next visit.  I spent 5 minutes educating him on the importance of smoking cessation and pharmacologic options.  Microcytic anemia At the next visit, I will send off a ferritin. If he truly has iron-deficiency anemia, I will refer him for a colonoscopy to rule out colorectal carcinoma.  Screening for HIV (human immunodeficiency virus) He is requesting HIV and hepatitis C screening. I offered this today but he left the clinic without getting his blood drawn. We'll get these labs next visit.  Atypical angina (HCC) His atypical angina  sounds most consistent with gastroesophageal reflux disease, so I've prescribed him pantoprazole  daily. Given his numerous atherosclerotic risk factors, if he continues to have angina at the next visit, I will refer him for a stress test.  Essential hypertension His blood pressure is well-controlled on enalapril  daily so we'll continue this for now.  Insomnia He wakes up easily at night and is hoping for a sleep-aid. He's already getting amytryptiline so I've offered melatonin in addition to giving him numerous sleeping tips.   Medications Ordered Meds ordered this encounter  Medications  . ACCU-CHEK SOFTCLIX LANCETS lancets    Sig: USE AS DIRECTED    Dispense:  400 each    Refill:  2    diag code E11.9 use to test 3 to 4 times daily. Insulin requiring  . glucose blood (ACCU-CHEK AVIVA) test strip    Sig: Use as instructed    Dispense:  100 each    Refill:  12    The patient is insulin requiring, ICD 10 code E11.65. The patient tests 3 times per day.  . insulin aspart protamine - aspart (NOVOLOG MIX 70/30 FLEXPEN) (70-30) 100 UNIT/ML FlexPen    Sig: Inject 33 units in the morning and 26 units in the evening    Dispense:  15 mL    Refill:  11  . pantoprazole (PROTONIX) 40 MG tablet    Sig: Take 1 tablet (40 mg total) by mouth daily.    Dispense:  30 tablet    Refill:  1  . canagliflozin (INVOKANA) 100 MG TABS tablet    Sig: Take 3 tablets (300 mg total) by mouth daily before breakfast.    Dispense:  90 tablet    Refill:  3  . Melatonin 1 MG TABS    Sig: Take 1 tablet (1 mg total) by mouth Nightly.    Dispense:  90 tablet    Refill:  3  . albuterol (PROVENTIL HFA;VENTOLIN HFA) 108 (90 Base) MCG/ACT inhaler    Sig: Inhale 2 puffs into the lungs every 6 (six) hours as needed for wheezing or shortness of breath.    Dispense:  1 Inhaler    Refill:  2  . nicotine (NICODERM CQ - DOSED IN MG/24 HR) 7 mg/24hr patch    Sig: Place 1 patch (7 mg total) onto the skin daily.     Dispense:  30 patch    Refill:  0  . nicotine polacrilex (NICORETTE) 4 MG gum    Sig: Take 1 each (4 mg total) by mouth as needed for smoking cessation.    Dispense:  100 tablet    Refill:  0   Other Orders Orders Placed This Encounter  Procedures  . Glucose, capillary   Follow Up: Return in about 2 weeks (around 02/07/2016).

## 2016-01-24 NOTE — Assessment & Plan Note (Signed)
His atypical angina sounds most consistent with gastroesophageal reflux disease, so I've prescribed him pantoprazole 40mg  daily. Given his numerous atherosclerotic risk factors, if he continues to have angina at the next visit, I will refer him for a stress test.

## 2016-01-27 NOTE — Progress Notes (Signed)
Internal Medicine Clinic Attending  Case discussed with Dr. Flores at the time of the visit.  We reviewed the resident's history and exam and pertinent patient test results.  I agree with the assessment, diagnosis, and plan of care documented in the resident's note. 

## 2016-02-07 ENCOUNTER — Telehealth: Payer: Self-pay | Admitting: Internal Medicine

## 2016-02-07 NOTE — Telephone Encounter (Signed)
APT. REMINDER CALL, NO ANSWER °

## 2016-02-08 ENCOUNTER — Ambulatory Visit: Payer: Medicaid Other | Admitting: Internal Medicine

## 2016-02-08 ENCOUNTER — Encounter: Payer: Self-pay | Admitting: Internal Medicine

## 2016-03-08 ENCOUNTER — Encounter: Payer: Self-pay | Admitting: Internal Medicine

## 2016-03-08 ENCOUNTER — Ambulatory Visit (INDEPENDENT_AMBULATORY_CARE_PROVIDER_SITE_OTHER): Payer: Medicaid Other | Admitting: Internal Medicine

## 2016-03-08 VITALS — BP 134/81 | HR 88 | Temp 97.4°F | Ht 73.0 in | Wt 207.7 lb

## 2016-03-08 DIAGNOSIS — G4700 Insomnia, unspecified: Secondary | ICD-10-CM

## 2016-03-08 DIAGNOSIS — Z79899 Other long term (current) drug therapy: Secondary | ICD-10-CM | POA: Diagnosis not present

## 2016-03-08 DIAGNOSIS — Z794 Long term (current) use of insulin: Secondary | ICD-10-CM | POA: Diagnosis not present

## 2016-03-08 DIAGNOSIS — E1100 Type 2 diabetes mellitus with hyperosmolarity without nonketotic hyperglycemic-hyperosmolar coma (NKHHC): Secondary | ICD-10-CM

## 2016-03-08 DIAGNOSIS — E11649 Type 2 diabetes mellitus with hypoglycemia without coma: Secondary | ICD-10-CM

## 2016-03-08 DIAGNOSIS — I10 Essential (primary) hypertension: Secondary | ICD-10-CM

## 2016-03-08 LAB — POCT GLYCOSYLATED HEMOGLOBIN (HGB A1C): Hemoglobin A1C: 7.2

## 2016-03-08 LAB — GLUCOSE, CAPILLARY: Glucose-Capillary: 74 mg/dL (ref 65–99)

## 2016-03-08 MED ORDER — MELATONIN 1 MG PO TABS: 1.0000 | ORAL_TABLET | Freq: Every evening | ORAL | Status: DC

## 2016-03-08 MED ORDER — INSULIN ASPART PROT & ASPART (70-30 MIX) 100 UNIT/ML PEN: PEN_INJECTOR | SUBCUTANEOUS | Status: DC

## 2016-03-08 NOTE — Assessment & Plan Note (Signed)
Melatonin is helping. Asking for refill - will refill.

## 2016-03-08 NOTE — Assessment & Plan Note (Signed)
Filed Vitals:   03/08/16 1351  BP: 134/81  Pulse: 88  Temp: 97.4 F (36.3 C)   BP at goal. Continue enalapril 10mg  daily.

## 2016-03-08 NOTE — Assessment & Plan Note (Signed)
DM II - last a1c 9.2 today 7.2. Currently on metformin 1000mg  bid + was supposed to be on 33 units 70/30 in AM and 26 units in PM, also on invokana. CBG 74 today. States b/c of sugar being low at home he cut down on his insulin 30 in morning 8 am and 24 at night 8 pm. Home blood sugar running in mid 110's some in 200's some lows in 80's. Does not eat anything after 8 pm usually.   With his hypoglycemic episodes especially being 70 today in clinic, we will cut down his insulin slightly.  Will do 28 units in AM and continue 24 units at PM but asked him to eat after taking evening insulin. Follow up in 1 month. Check sugar 3 times a day at home.

## 2016-03-08 NOTE — Patient Instructions (Signed)
Please take insulin 28 units in AM and 24 units in PM.  Make sure you eat after taking your insulin.  Check your sugar 3 times a day.  Follow up in 1 month with your home sugar readings.

## 2016-03-08 NOTE — Progress Notes (Signed)
   Subjective:    Patient ID: Ruben Pierce, male    DOB: [birth year], 50 y.o.   MRN: [id number]  HPI  50 yo male with hx of uncontrolled DM II, HTN, insomnia, atypical angina, GERD, presents for follow up of DM II and HTN.  DM II - last a1c 9.2 in March 2017. Currently on metformin 1000mg  bid + was supposed to be on 33 units 70/30 in AM and 26 units in PM, also on invokana. CBG 74 today. States b/c of sugar being low at home he cut down on his insulin 30 in morning 8 am and 24 at night 8 pm. Home blood sugar running in mid 110's some in 200's some lows in 80's. Does not eat anything after 8 pm usually.   HTN - well controlled currently on enalapril 10mg  daily.   Insomnia - feels that melatonin is helping, wants refill.  No cp or sob.  Review of Systems  Constitutional: Negative for fever and chills.  HENT: Negative for congestion and sore throat.   Respiratory: Negative for chest tightness and shortness of breath.   Cardiovascular: Negative for chest pain, palpitations and leg swelling.  Gastrointestinal: Negative for abdominal pain and abdominal distention.  Neurological: Positive for numbness. Negative for dizziness.       Objective:   Physical Exam  Constitutional: He is oriented to person, place, and time. He appears well-developed and well-nourished. No distress.  HENT:  Head: Normocephalic and atraumatic.  Eyes: Conjunctivae are normal. Right eye exhibits no discharge. Left eye exhibits no discharge.  Cardiovascular: Normal rate and regular rhythm.  Exam reveals no gallop and no friction rub.   No murmur heard. Pulmonary/Chest: Effort normal and breath sounds normal. No respiratory distress. He has no wheezes.  Abdominal: Soft. Bowel sounds are normal. He exhibits no distension. There is no tenderness.  Musculoskeletal:  Right great toe has some numbness and decreased sensation since bunionectomy surgery. No other neuro deficits.   Has very small area of ulcer on right  great toe which does not look infected. Healing well.   Neurological: He is alert and oriented to person, place, and time. No cranial nerve deficit.  Skin: He is not diaphoretic.     Filed Vitals:   03/08/16 1351  BP: 134/81  Pulse: 88  Temp: 97.4 F (36.3 C)        Assessment & Plan:  See problem based a&p

## 2016-03-09 NOTE — Progress Notes (Signed)
Internal Medicine Clinic Attending  Case discussed with Dr. Ahmed at the time of the visit.  We reviewed the resident's history and exam and pertinent patient test results.  I agree with the assessment, diagnosis, and plan of care documented in the resident's note. 

## 2016-04-12 ENCOUNTER — Encounter: Payer: Medicaid Other | Admitting: Internal Medicine

## 2016-04-12 ENCOUNTER — Encounter: Payer: Self-pay | Admitting: Internal Medicine

## 2016-05-24 ENCOUNTER — Ambulatory Visit (INDEPENDENT_AMBULATORY_CARE_PROVIDER_SITE_OTHER): Payer: Medicaid Other | Admitting: Internal Medicine

## 2016-05-24 ENCOUNTER — Encounter: Payer: Self-pay | Admitting: Internal Medicine

## 2016-05-24 VITALS — BP 130/84 | HR 93 | Temp 98.1°F | Ht 73.0 in | Wt 199.9 lb

## 2016-05-24 DIAGNOSIS — Z1211 Encounter for screening for malignant neoplasm of colon: Secondary | ICD-10-CM | POA: Insufficient documentation

## 2016-05-24 DIAGNOSIS — Z23 Encounter for immunization: Secondary | ICD-10-CM

## 2016-05-24 DIAGNOSIS — G4700 Insomnia, unspecified: Secondary | ICD-10-CM | POA: Diagnosis not present

## 2016-05-24 DIAGNOSIS — F17210 Nicotine dependence, cigarettes, uncomplicated: Secondary | ICD-10-CM | POA: Diagnosis not present

## 2016-05-24 DIAGNOSIS — E119 Type 2 diabetes mellitus without complications: Secondary | ICD-10-CM

## 2016-05-24 DIAGNOSIS — D509 Iron deficiency anemia, unspecified: Secondary | ICD-10-CM | POA: Diagnosis not present

## 2016-05-24 DIAGNOSIS — J45909 Unspecified asthma, uncomplicated: Secondary | ICD-10-CM

## 2016-05-24 DIAGNOSIS — Z794 Long term (current) use of insulin: Secondary | ICD-10-CM | POA: Diagnosis not present

## 2016-05-24 DIAGNOSIS — Z0000 Encounter for general adult medical examination without abnormal findings: Secondary | ICD-10-CM

## 2016-05-24 DIAGNOSIS — K219 Gastro-esophageal reflux disease without esophagitis: Secondary | ICD-10-CM | POA: Diagnosis not present

## 2016-05-24 DIAGNOSIS — Z79899 Other long term (current) drug therapy: Secondary | ICD-10-CM | POA: Diagnosis not present

## 2016-05-24 DIAGNOSIS — E1100 Type 2 diabetes mellitus with hyperosmolarity without nonketotic hyperglycemic-hyperosmolar coma (NKHHC): Secondary | ICD-10-CM

## 2016-05-24 DIAGNOSIS — Z720 Tobacco use: Secondary | ICD-10-CM

## 2016-05-24 DIAGNOSIS — Z114 Encounter for screening for human immunodeficiency virus [HIV]: Secondary | ICD-10-CM

## 2016-05-24 DIAGNOSIS — I10 Essential (primary) hypertension: Secondary | ICD-10-CM | POA: Diagnosis not present

## 2016-05-24 LAB — GLUCOSE, CAPILLARY: GLUCOSE-CAPILLARY: 84 mg/dL (ref 65–99)

## 2016-05-24 LAB — POCT GLYCOSYLATED HEMOGLOBIN (HGB A1C): Hemoglobin A1C: 5.4

## 2016-05-24 MED ORDER — ALBUTEROL SULFATE HFA 108 (90 BASE) MCG/ACT IN AERS: 2.0000 | INHALATION_SPRAY | Freq: Four times a day (QID) | RESPIRATORY_TRACT | 2 refills | Status: DC | PRN

## 2016-05-24 MED ORDER — AMITRIPTYLINE HCL 100 MG PO TABS: 100.0000 mg | ORAL_TABLET | Freq: Every day | ORAL | 1 refills | Status: DC

## 2016-05-24 MED ORDER — ENALAPRIL MALEATE 10 MG PO TABS: 10.0000 mg | ORAL_TABLET | Freq: Every day | ORAL | 3 refills | Status: DC

## 2016-05-24 MED ORDER — PANTOPRAZOLE SODIUM 40 MG PO TBEC: 40.0000 mg | DELAYED_RELEASE_TABLET | Freq: Every day | ORAL | 1 refills | Status: DC

## 2016-05-24 NOTE — Assessment & Plan Note (Signed)
Has taken Amitriptyline 100 mg qhs before with benefit, however he reports he has been out of this for 3 months now. He was prescribed melatonin as well, but he is unsure if he has taken this and it is not with the medications he brought today. He states he has trouble initiating sleep and returning to sleep when he wakes to use the bathroom. He is requesting a refill of the amitriptyline. -Refill Amitriptyline 100 mg daily

## 2016-05-24 NOTE — Assessment & Plan Note (Signed)
Patient reports a history of asthma for which he uses an albuterol inhaler. He is requesting a refill for his inhaler. He says he uses his inhaler daily usually after walking. He denies any shortness of breath. No PFTs on file.  He has expiratory wheezing on exam. As he uses his albuterol inhaler daily, he likely has a moderate-persistent asthma. Would need to consider further clarification with consideration of referring for FTs on follow up as well as adding an inhaled glucocorticoid. -Refill albuterol -Consider addition of of inhaled glucocorticoid on follow up -Consider referral for PFTs on follow up

## 2016-05-24 NOTE — Assessment & Plan Note (Signed)
Reports adherence to Enalapril 10 mg daily. BP today is 130/84.  Well controlled -Continue Enalapril 10 mg daily

## 2016-05-24 NOTE — Assessment & Plan Note (Addendum)
CBC from 11/2015 shows a slight microcytic anemia. He denies any hematochezia, melena, or hematuria. He reports eating chicken and steak often as well as greens. -Check CBC, Ferritin -Consider referral for colonoscopy if iron deficient (would be due for screening colonoscopy in 1 year)  ADDENDUM Ferritin low at 7. I will refer him for colonoscopy to rule out colorectal carcinoma.

## 2016-05-24 NOTE — Assessment & Plan Note (Signed)
Has been taking Pantoprazole 40 mg daily with benefit, however he ran out of this some time ago and has return of heartburn symptoms after meals. -Refill and continue Pantoprazole 40 mg daily at least 30 minutes prior to meal

## 2016-05-24 NOTE — Assessment & Plan Note (Signed)
Patient reports adherence to Metformin 1000 mg BID, Invokana 300 mg daily, and Novolog 70/30 28 units am and 24 units pm. Last A1c was 7.2 on 03/08/2016. He checks his blood sugar 3 times daily. He brought his meter today which shows an average CBG of 110 with high of 189 and low of 65. He denies any hypo/hyperglycemic symptoms which used to present with lightheadedness. He feels his diabetic control has been much better recently. He denies any dysuria.  Hgb A1c today is 5.4, much improved. -Continue current medications with anticipation of down titrating insulin on follow up

## 2016-05-24 NOTE — Assessment & Plan Note (Signed)
Agreeable to flu shot today. He has also requested HIV screening. -Administer flu shot -Check HIV

## 2016-05-24 NOTE — Assessment & Plan Note (Signed)
Patient smokes about 10 hand rolled tobacco cigarettes daily. He feels that he can quit completely on his own "no problem" and says he will once he finishes the rest of his tobacco. He reports quitting completely before but restarted as he is around family who smokes. He reports nicotine patch and gum were not helpful.  I encouraged patient to work towards smoking cessation now. He agrees to quit smoking, but it is unclear if he will do so now or until he finishes his remaining tobacco.

## 2016-05-24 NOTE — Progress Notes (Signed)
CC: Diabetes  HPI:  [name] is a 50 y.o. male with PMH of Insulin-Dependent T2DM, HTN, GERD, TBI, and tobacco use who presents for follow up management of his T2DM and HTN as well as medication refill.  T2DM: Patient reports adherence to Metformin 1000 mg BID, Invokana 300 mg daily, and Novolog 70/30 28 units am and 24 units pm. Last A1c was 7.2 on 03/08/2016. He checks his blood sugar 3 times daily. He brought his meter today which shows an average CBG of 110 with high of 189 and low of 65. He denies any hypo/hyperglycemic symptoms which used to present with lightheadedness. He feels his diabetic control has been much better recently.  HTN Reports adherence to Enalapril 10 mg daily. BP today is 130/84.  Insomnia Has taken Amitriptyline 100 mg qhs before with benefit, however he reports he has been out of this for 3 months now. He was prescribed melatonin as well, but he is unsure if he has taken this and it is not with the medications he brought today. He states he has trouble initiating sleep and returning to sleep when he wakes to use the bathroom.  GERD Has been taking Pantoprazole 40 mg daily with benefit, however he ran out of this and has return of heartburn symptoms after meals.  Tobacco use Patient smokes about 10 hand rolled tobacco cigarettes daily. He feels that he can quit completely on his own "no problem" and says he will once he finishes the rest of his tobacco. He reports quitting completely before but restarted as he is around family who smokes. He reports nicotine patch and gum were not helpful.  Microcytic anemia CBC from 11/2015 shows a slight microcytic anemia. He denies any hematochezia, melena, or hematuria. He reports eating chicken and steak often as well as greens.  Health maintenance Patient agreeable to flu shot today and requesting HIV screening.  Asthma Patient reports a history of asthma for which he uses an albuterol inhaler. He is requesting a  refill for his inhaler. He says he uses his inhaler daily usually after walking. He denies any shortness of breath. No PFTs on file.   Past Medical History:  Diagnosis Date  . Asthma   . Hypertension   . TBI (traumatic brain injury) (HCC)   . Type 2 diabetes mellitus, uncontrolled (HCC) 2010    Review of Systems:   Review of Systems  Constitutional: Negative for chills, diaphoresis and fever.  Respiratory: Negative for cough and shortness of breath.   Cardiovascular: Negative for chest pain and leg swelling.  Gastrointestinal: Positive for heartburn. Negative for abdominal pain, blood in stool, constipation, diarrhea, melena, nausea and vomiting.  Genitourinary: Negative for dysuria and hematuria.  Musculoskeletal: Negative for falls and myalgias.  Neurological: Negative for dizziness, sensory change, focal weakness, loss of consciousness and weakness.     Physical Exam:  Vitals:   05/24/16 1438  BP: 130/84  Pulse: 93  Temp: 98.1 F (36.7 C)  TempSrc: Oral  SpO2: 99%  Weight: 199 lb 14.4 oz (90.7 kg)  Height: 6\' 1"  (1.854 m)   Physical Exam  Constitutional: He appears well-nourished. No distress.  Cardiovascular: Normal rate and regular rhythm.  Exam reveals no gallop and no friction rub.   No murmur heard. +2 DP pulses bilaterally  Pulmonary/Chest: Effort normal. No respiratory distress. He has wheezes. He has no rales.  End-expiratory wheezing bilaterally  Musculoskeletal: He exhibits no edema or tenderness.  Right great toe with well healed surgical  scar s/p bunionectomy  Skin: Skin is warm. He is not diaphoretic.    Assessment & Plan:   See Encounters Tab for problem based charting.  Patient discussed with Dr. Criselda PeachesMullen

## 2016-05-24 NOTE — Patient Instructions (Addendum)
It was a pleasure to meet you [name].  Please continue your current medications as prescribed. You blood pressure and diabetes control is good.  We will do some blood work today and I will refill your medications.  Please do your best to quit smoking.  We are giving you the flu shot today.

## 2016-05-25 ENCOUNTER — Telehealth: Payer: Self-pay | Admitting: *Deleted

## 2016-05-25 LAB — CBC
HEMATOCRIT: 40.6 % (ref 37.5–51.0)
Hemoglobin: 13.1 g/dL (ref 12.6–17.7)
MCH: 23.4 pg — ABNORMAL LOW (ref 26.6–33.0)
MCHC: 32.3 g/dL (ref 31.5–35.7)
MCV: 73 fL — ABNORMAL LOW (ref 79–97)
Platelets: 288 10*3/uL (ref 150–379)
RBC: 5.59 x10E6/uL (ref 4.14–5.80)
RDW: 17.1 % — AB (ref 12.3–15.4)
WBC: 8 10*3/uL (ref 3.4–10.8)

## 2016-05-25 LAB — FERRITIN: FERRITIN: 7 ng/mL — AB (ref 30–400)

## 2016-05-25 LAB — HIV ANTIBODY (ROUTINE TESTING W REFLEX): HIV Screen 4th Generation wRfx: NONREACTIVE

## 2016-05-25 NOTE — Telephone Encounter (Signed)
Call to [employer] for Prior Authorization for Amitriptyline.  None needed for medication-pharmacy has received a paid claim. Contact # [phone]  Angelina OkGladys Keyana Guevara, RN 05/25/2016 10:42 AM.

## 2016-05-26 NOTE — Progress Notes (Signed)
Internal Medicine Clinic Attending  Case discussed with Dr. Patel,Vishal at the time of the visit.  We reviewed the resident's history and exam and pertinent patient test results.  I agree with the assessment, diagnosis, and plan of care documented in the resident's note.  

## 2016-05-29 NOTE — Addendum Note (Signed)
Addended by: Charlsie QuestPATEL, Ambriella Kitt R on: 05/29/2016 02:58 PM   Modules accepted: Orders

## 2016-06-06 ENCOUNTER — Encounter: Payer: Self-pay | Admitting: *Deleted

## 2016-06-30 ENCOUNTER — Other Ambulatory Visit: Payer: Self-pay | Admitting: *Deleted

## 2016-06-30 MED ORDER — METFORMIN HCL 1000 MG PO TABS: 1000.0000 mg | ORAL_TABLET | Freq: Two times a day (BID) | ORAL | 3 refills | Status: DC

## 2016-07-06 NOTE — Addendum Note (Signed)
Addended by: Neomia DearPOWERS, Henderson Frampton E on: 07/06/2016 08:09 PM   Modules accepted: Orders

## 2016-08-23 ENCOUNTER — Encounter: Payer: Medicaid Other | Admitting: Internal Medicine

## 2016-08-31 ENCOUNTER — Encounter: Payer: Medicaid Other | Admitting: Internal Medicine

## 2016-10-03 ENCOUNTER — Telehealth: Payer: Self-pay | Admitting: Internal Medicine

## 2016-10-03 NOTE — Telephone Encounter (Signed)
APT. REMINDER CALL, NO ANSWER, NO VOICEMAIL °

## 2016-10-04 ENCOUNTER — Encounter: Payer: Self-pay | Admitting: Internal Medicine

## 2016-10-04 ENCOUNTER — Encounter: Payer: Medicaid Other | Admitting: Internal Medicine

## 2016-10-05 ENCOUNTER — Other Ambulatory Visit: Payer: Self-pay | Admitting: Internal Medicine

## 2016-10-18 ENCOUNTER — Encounter: Payer: Medicaid Other | Admitting: Internal Medicine

## 2016-10-31 ENCOUNTER — Telehealth: Payer: Self-pay | Admitting: Internal Medicine

## 2016-10-31 NOTE — Telephone Encounter (Signed)
APT. REMINDER CALL, PHONE NOT IN SERVICE °

## 2016-11-01 ENCOUNTER — Encounter: Payer: Self-pay | Admitting: Internal Medicine

## 2016-11-01 ENCOUNTER — Ambulatory Visit (INDEPENDENT_AMBULATORY_CARE_PROVIDER_SITE_OTHER): Payer: Medicaid Other | Admitting: Internal Medicine

## 2016-11-01 VITALS — BP 135/86 | HR 98 | Temp 97.7°F | Ht 73.0 in | Wt 210.1 lb

## 2016-11-01 DIAGNOSIS — G4700 Insomnia, unspecified: Secondary | ICD-10-CM | POA: Diagnosis not present

## 2016-11-01 DIAGNOSIS — F17210 Nicotine dependence, cigarettes, uncomplicated: Secondary | ICD-10-CM | POA: Diagnosis not present

## 2016-11-01 DIAGNOSIS — J4540 Moderate persistent asthma, uncomplicated: Secondary | ICD-10-CM

## 2016-11-01 DIAGNOSIS — E1159 Type 2 diabetes mellitus with other circulatory complications: Secondary | ICD-10-CM | POA: Diagnosis present

## 2016-11-01 DIAGNOSIS — I152 Hypertension secondary to endocrine disorders: Secondary | ICD-10-CM

## 2016-11-01 DIAGNOSIS — I10 Essential (primary) hypertension: Secondary | ICD-10-CM

## 2016-11-01 DIAGNOSIS — E118 Type 2 diabetes mellitus with unspecified complications: Secondary | ICD-10-CM

## 2016-11-01 DIAGNOSIS — Z79899 Other long term (current) drug therapy: Secondary | ICD-10-CM

## 2016-11-01 DIAGNOSIS — Z720 Tobacco use: Secondary | ICD-10-CM

## 2016-11-01 DIAGNOSIS — Z7984 Long term (current) use of oral hypoglycemic drugs: Secondary | ICD-10-CM

## 2016-11-01 DIAGNOSIS — D509 Iron deficiency anemia, unspecified: Secondary | ICD-10-CM | POA: Diagnosis not present

## 2016-11-01 DIAGNOSIS — Z794 Long term (current) use of insulin: Secondary | ICD-10-CM

## 2016-11-01 LAB — GLUCOSE, CAPILLARY: GLUCOSE-CAPILLARY: 182 mg/dL — AB (ref 65–99)

## 2016-11-01 LAB — POCT GLYCOSYLATED HEMOGLOBIN (HGB A1C): Hemoglobin A1C: 6.2

## 2016-11-01 MED ORDER — INSULIN ASPART PROT & ASPART (70-30 MIX) 100 UNIT/ML PEN: PEN_INJECTOR | SUBCUTANEOUS | 11 refills | Status: DC

## 2016-11-01 MED ORDER — ENALAPRIL MALEATE 10 MG PO TABS: 10.0000 mg | ORAL_TABLET | Freq: Every day | ORAL | 3 refills | Status: DC

## 2016-11-01 MED ORDER — ALBUTEROL SULFATE HFA 108 (90 BASE) MCG/ACT IN AERS: INHALATION_SPRAY | RESPIRATORY_TRACT | 0 refills | Status: DC

## 2016-11-01 MED ORDER — CANAGLIFLOZIN 100 MG PO TABS: 300.0000 mg | ORAL_TABLET | Freq: Every day | ORAL | 3 refills | Status: DC

## 2016-11-01 MED ORDER — FERROUS SULFATE 325 (65 FE) MG PO TBEC: 325.0000 mg | DELAYED_RELEASE_TABLET | Freq: Every day | ORAL | 0 refills | Status: DC

## 2016-11-01 MED ORDER — AMITRIPTYLINE HCL 100 MG PO TABS: 100.0000 mg | ORAL_TABLET | Freq: Every day | ORAL | 1 refills | Status: DC

## 2016-11-01 MED ORDER — BUDESONIDE 90 MCG/ACT IN AEPB: 1.0000 | INHALATION_SPRAY | Freq: Two times a day (BID) | RESPIRATORY_TRACT | 3 refills | Status: DC

## 2016-11-01 MED ORDER — ACCU-CHEK SOFTCLIX LANCETS MISC: 2 refills | Status: DC

## 2016-11-01 MED ORDER — GLUCOSE BLOOD VI STRP: ORAL_STRIP | 12 refills | Status: DC

## 2016-11-01 MED ORDER — CANAGLIFLOZIN 300 MG PO TABS: 300.0000 mg | ORAL_TABLET | Freq: Every day | ORAL | 3 refills | Status: DC

## 2016-11-01 MED ORDER — INSULIN PEN NEEDLE 31G X 5 MM MISC: 1.0000 [IU] | 11 refills | Status: DC

## 2016-11-01 MED ORDER — METFORMIN HCL 1000 MG PO TABS
1000.0000 mg | ORAL_TABLET | Freq: Two times a day (BID) | ORAL | 3 refills | Status: DC
Start: 2016-11-01 — End: 2017-09-05

## 2016-11-01 NOTE — Patient Instructions (Addendum)
It was a pleasure to see you again Ruben Pierce.  I have refilled your medications to [facility] on [address].  Your diabetes is under good control. Your Hemoglobin A1c is 6.2, keep up the good work!  Your blood pressure looks good today.  I will prescribe an inhaler called Budesonide for your breathing. Use this once a day plus your albuterol as needed.  We will try to schedule you for a lung test, eye exam, and colonoscopy.  Please try your best to cut back further on smoking.  I will add iron supplement for your low blood counts.  Please follow up with me in 3 months.  Budesonide inhalation powder What is this medicine? BUDESONIDE (bue DES oh nide) is a corticosteroid. It helps to decrease inflammation in your lungs. This medicine is used to treat the symptoms of asthma. Never use this medicine for an acute asthma attack. This medicine may be used for other purposes; ask your health care provider or pharmacist if you have questions. COMMON BRAND NAME(S): Pulmicort What should I tell my health care provider before I take this medicine? They need to know if you have any of these conditions: -bone problems -glaucoma -immune system problems -infection, like chickenpox, tuberculosis, herpes, or fungal infection -recent surgery or injury of mouth or throat -taking corticosteroids by mouth -an unusual or allergic reaction to budesonide, steroids, other medicines, foods, dyes, or preservatives -pregnant or trying to get pregnant -breast-feeding How should I use this medicine? This medicine is inhaled through the mouth. Rinse your mouth with water after use. Make sure not to swallow the water. Follow the directions on your prescription label. Do not use more often than directed. Make sure that you are using your inhaler correctly. Ask you doctor or health care provider if you have any questions. Talk to your pediatrician regarding the use of this medicine in  children. Special care may be needed. While this drug may be prescribed for children as young as 103 years of age for selected conditions, precautions do apply. Overdosage: If you think you have taken too much of this medicine contact a poison control center or emergency room at once. NOTE: This medicine is only for you. Do not share this medicine with others. What if I miss a dose? If you miss a dose, use it as soon as you remember. If it is almost time for your next dose, use only that dose and continue with your regular schedule. Do not use double or extra doses. What may interact with this medicine? Do not take this medicine with any of the following medications: -mifepristone This medicine may also interact with the following medications: -cimetidine -clarithromycin -erythromycin -ketoconazole -grapefruit juice -itraconazole -some vaccinations This list may not describe all possible interactions. Give your health care provider a list of all the medicines, herbs, non-prescription drugs, or dietary supplements you use. Also tell them if you smoke, drink alcohol, or use illegal drugs. Some items may interact with your medicine. What should I watch for while using this medicine? Visit your doctor or health care professional for regular checks on your progress. Check with your doctor if your symptoms do not improve. If your symptoms get worse or if you need your short-acting inhalers more often, call your doctor right away. Do not stop taking your medicine unless your doctor tells you to. This medicine may increase your risk of getting an infection. Stay away from people who are sick. Tell your doctor or health  care professional if you are around anyone with measles or chickenpox. What side effects may I notice from receiving this medicine? Side effects that you should report to your doctor or health care professional as soon as possible: -allergic reactions like skin rash, itching or hives,  swelling of the face, lips, or tongue -breathing problems -changes in vision -white patches or sores in the mouth or throat -unusual swelling Side effects that usually do not require medical attention (report to your doctor or health care professional if they continue or are bothersome): -coughing, hoarseness -dry mouth -loss of taste, or unpleasant taste -stomach upset This list may not describe all possible side effects. Call your doctor for medical advice about side effects. You may report side effects to FDA at 1-800-FDA-1088. Where should I keep my medicine? Keep out of the reach of children. Store in a dry place at room temperature between 20 and 25 degrees C (68 and 77 degrees F). Do not get the inhaler wet. Check if your inhaler has a way to show you if the doses are all gone. Throw away your inhaler when it is empty. Do not reuse this inhaler. Throw away any unused medicine after the expiration date. NOTE: This sheet is a summary. It may not cover all possible information. If you have questions about this medicine, talk to your doctor, pharmacist, or health care provider.  2017 Elsevier/Gold Standard (2013-03-06 11:09:24)

## 2016-11-01 NOTE — Progress Notes (Signed)
   CC: Diabetes  HPI:  [name] is a 51 y.o. male with PMH of IDDM, HTN, GERD, TBI, and tobacco use who presents for follow up management of his diabetes. Please see problem based charting for status of patient's chronic medical issues.  T2DM: Patient reports adherence to Metformin 1000 mg BID and Novolog 70/30 28 units am and 24 units pm. He is prescribed Invokana 300 mg daily, but has only been taking 100 mg daily. Last A1c was 5.4 in August 2017. He checks his blood sugar 3 times daily. He denies any hypo/hyperglycemic symptoms.  HTN Reports adherence to Enalapril 10 mg daily. BP today is 135/86.  Insomnia He is taking Amitriptyline 100 mg qhs with benefit.  Tobacco use Patient smokes about 4-6 cigarettes daily, down from 10 daily. He feels that he can continue cutting back to complete cessation on his own.  Microcytic anemia Ferritin on last visit was low at 7. I referred him for colonoscopy however this was not able to be scheduled. He denies any obvious sign of bleeding, hematochezia, melena, or hematuria.  Moderate Persistent Asthma Patient reports a history of asthma for which he uses an albuterol inhaler. He says his breathing is good, but he is using his inhaler 3-4 times per day. He denies any current shortness of breath. No PFTs on file.  Past Medical History:  Diagnosis Date  . Asthma   . Hypertension   . TBI (traumatic brain injury) (HCC)   . Type 2 diabetes mellitus, uncontrolled (HCC) 2010    Review of Systems:   Review of Systems  Constitutional: Negative for chills and fever.  Respiratory: Negative for shortness of breath.   Cardiovascular: Negative for chest pain.  Gastrointestinal: Negative for blood in stool and melena.  Genitourinary: Negative for hematuria.  Neurological: Negative for dizziness.     Physical Exam:  Vitals:   11/01/16 1336  BP: 135/86  Pulse: 98  Temp: 97.7 F (36.5 C)  TempSrc: Oral  SpO2: 100%  Weight: 210 lb 1.6  oz (95.3 kg)  Height: 6\' 1"  (1.854 m)   Physical Exam  Constitutional: He is oriented to person, place, and time. He appears well-developed and well-nourished. No distress.  Cardiovascular: Normal rate and regular rhythm.   Pulmonary/Chest: Effort normal. No respiratory distress.  Faint expiratory wheezing  Neurological: He is alert and oriented to person, place, and time.  Skin: Skin is warm.    Assessment & Plan:   See Encounters Tab for problem based charting.  Patient discussed with Dr. Criselda PeachesMullen

## 2016-11-03 ENCOUNTER — Telehealth: Payer: Self-pay | Admitting: *Deleted

## 2016-11-03 NOTE — Telephone Encounter (Signed)
[facility] for Pulmicort.  Denied pt will need to try and fail preferred Pulmicort Resules, Flovent or Qvar.  Message to be sent to Dr. Allena KatzPatel .  Angelina OkGladys Yamira Papa, RN 11/03/2016 11:45 AM.

## 2016-11-04 ENCOUNTER — Encounter: Payer: Self-pay | Admitting: Internal Medicine

## 2016-11-04 NOTE — Assessment & Plan Note (Signed)
Patient reports a history of asthma for which he uses an albuterol inhaler. He says his breathing is good, but he is using his inhaler 3-4 times per day. He denies any current shortness of breath. No PFTs on file.  Patient with increased use of his albuterol inhaler. I will add daily budesonide and refer for PFTs to better clarify his underlying lung disease.

## 2016-11-04 NOTE — Assessment & Plan Note (Signed)
Patient reports adherence to Metformin 1000 mg BID and Novolog 70/30 28 units am and 24 units pm. He is prescribed Invokana 300 mg daily, but has only been taking 100 mg daily. Last A1c was 5.4 in August 2017. He checks his blood sugar 3 times daily. He denies any hypo/hyperglycemic symptoms.  Repeat A1c this visit is 6.2. His diabetes is well-controlled. I have advised him to take the Invokana as prescribed 300 mg daily and continue his other medications with hopes of reducing his insulin on follow up. -Continue Metformin 1000 mg BID -Continue Novolog 70/30 28 units am and 24 units pm -Invokana 300 mg daily -Refer to Ophthalmology for eye exam

## 2016-11-04 NOTE — Assessment & Plan Note (Signed)
Ferritin on last visit was low at 7. I referred him for colonoscopy however this was not able to be scheduled. He denies any obvious sign of bleeding, hematochezia, melena, or hematuria.  We will try to arrange an appointment with GI for possible colonoscopy. I have updated his contact information in the demographics tab. I will also start him on iron supplementation with ferrous sulfate.

## 2016-11-04 NOTE — Assessment & Plan Note (Signed)
Reports adherence to Enalapril 10 mg daily. BP today is 135/86.  Well controlled, continue Enalapril 10 mg daily.

## 2016-11-04 NOTE — Assessment & Plan Note (Signed)
He is taking Amitriptyline 100 mg qhs with benefit. -Refill Amitriptyline 100 mg qhs

## 2016-11-04 NOTE — Assessment & Plan Note (Signed)
Patient smokes about 4-6 cigarettes daily, down from 10 daily. He feels that he can continue cutting back to complete cessation on his own.  Patient is motivated to continue cutting back to complete cessation. He feels he does not require assistance with this. I have encouraged him in his efforts to quit smoking.

## 2016-11-07 NOTE — Progress Notes (Signed)
Internal Medicine Clinic Attending  Case discussed with Dr. Patel,Vishal soon after the resident saw the patient.  We reviewed the resident's history and exam and pertinent patient test results.  I agree with the assessment, diagnosis, and plan of care documented in the resident's note. 

## 2016-11-17 MED ORDER — BECLOMETHASONE DIPROPIONATE 40 MCG/ACT IN AERS: 2.0000 | INHALATION_SPRAY | Freq: Two times a day (BID) | RESPIRATORY_TRACT | 12 refills | Status: DC

## 2016-11-17 NOTE — Telephone Encounter (Signed)
Thanks, have sent prescription for QVAR (beclomethasone 40 mcg/act inhaler 2 puffs twice a day).

## 2016-11-29 ENCOUNTER — Other Ambulatory Visit: Payer: Self-pay | Admitting: Internal Medicine

## 2016-12-01 ENCOUNTER — Other Ambulatory Visit: Payer: Self-pay | Admitting: Internal Medicine

## 2017-01-06 ENCOUNTER — Other Ambulatory Visit: Payer: Self-pay | Admitting: Internal Medicine

## 2017-01-31 ENCOUNTER — Encounter: Payer: Medicaid Other | Admitting: Internal Medicine

## 2017-02-21 ENCOUNTER — Emergency Department (HOSPITAL_COMMUNITY)
Admission: EM | Admit: 2017-02-21 | Discharge: 2017-02-21 | Disposition: A | Discharge status: Home / Self Care | Payer: Medicaid Other | Attending: Emergency Medicine | Admitting: Emergency Medicine

## 2017-02-21 ENCOUNTER — Emergency Department (HOSPITAL_COMMUNITY): Payer: Medicaid Other

## 2017-02-21 ENCOUNTER — Encounter (HOSPITAL_COMMUNITY): Payer: Self-pay

## 2017-02-21 DIAGNOSIS — F17210 Nicotine dependence, cigarettes, uncomplicated: Secondary | ICD-10-CM | POA: Diagnosis not present

## 2017-02-21 DIAGNOSIS — M25562 Pain in left knee: Secondary | ICD-10-CM | POA: Insufficient documentation

## 2017-02-21 DIAGNOSIS — Z794 Long term (current) use of insulin: Secondary | ICD-10-CM | POA: Diagnosis not present

## 2017-02-21 DIAGNOSIS — X58XXXA Exposure to other specified factors, initial encounter: Secondary | ICD-10-CM | POA: Diagnosis not present

## 2017-02-21 DIAGNOSIS — I10 Essential (primary) hypertension: Secondary | ICD-10-CM | POA: Diagnosis not present

## 2017-02-21 DIAGNOSIS — E119 Type 2 diabetes mellitus without complications: Secondary | ICD-10-CM | POA: Diagnosis not present

## 2017-02-21 DIAGNOSIS — J45909 Unspecified asthma, uncomplicated: Secondary | ICD-10-CM | POA: Diagnosis not present

## 2017-02-21 DIAGNOSIS — S8992XA Unspecified injury of left lower leg, initial encounter: Secondary | ICD-10-CM | POA: Diagnosis present

## 2017-02-21 DIAGNOSIS — Y939 Activity, unspecified: Secondary | ICD-10-CM | POA: Diagnosis not present

## 2017-02-21 DIAGNOSIS — Y929 Unspecified place or not applicable: Secondary | ICD-10-CM | POA: Diagnosis not present

## 2017-02-21 DIAGNOSIS — Y999 Unspecified external cause status: Secondary | ICD-10-CM | POA: Insufficient documentation

## 2017-02-21 MED ORDER — NAPROXEN 250 MG PO TABS
500.0000 mg | ORAL_TABLET | Freq: Once | ORAL | Status: AC
  Administered 2017-02-21: 500 mg via ORAL
  Filled 2017-02-21: qty 2

## 2017-02-21 MED ORDER — MELOXICAM 15 MG PO TABS: 15.0000 mg | ORAL_TABLET | Freq: Every day | ORAL | 0 refills | Status: AC
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
## 2017-02-21 NOTE — Progress Notes (Signed)
Orthopedic Tech Progress Note Patient Details:  Ruben Pierce 16-Jan-1966 [id number]  Ortho Devices Type of Ortho Device: Knee Immobilizer, Crutches Ortho Device/Splint Location: lue Ortho Device/Splint Interventions: Application   Ruben Pierce 02/21/2017, 2:39 PM

## 2017-02-21 NOTE — ED Notes (Signed)
Pt is in stable condition upon d/c and is escorted from ED via wheelchair. 

## 2017-02-21 NOTE — Discharge Instructions (Signed)
Take Mobic daily for inflammation. You may also take Tylenol additionally every 6 hours for pain. Wear the knee immobilizer for comfort. Apply ice or heat to the knee as needed. Follow-up with Dr. Eulah PontMurphy the orthopedist for reevaluation. Return to the ED if any concerning symptoms develop.  Use Moist heat therapy by taking a dish rag and soaking in water then wringing out excess water then microwave for 10-15 seconds until hot but not so hot that it would burn and heat to areas of soreness for 15 minutes, multiple times a day.

## 2017-02-21 NOTE — ED Triage Notes (Signed)
Per Pt, Pt reports pain in his left leg that started three weeks ago and has gotten worse. Pt has Hx of injury to the left leg. Pt is walking with a cane.

## 2017-03-07 ENCOUNTER — Ambulatory Visit (INDEPENDENT_AMBULATORY_CARE_PROVIDER_SITE_OTHER): Payer: Medicaid Other | Admitting: Internal Medicine

## 2017-03-07 ENCOUNTER — Encounter: Payer: Self-pay | Admitting: Internal Medicine

## 2017-03-07 VITALS — BP 130/77 | HR 102 | Temp 97.6°F | Ht 73.0 in | Wt 208.0 lb

## 2017-03-07 DIAGNOSIS — Z79899 Other long term (current) drug therapy: Secondary | ICD-10-CM | POA: Diagnosis not present

## 2017-03-07 DIAGNOSIS — Z87820 Personal history of traumatic brain injury: Secondary | ICD-10-CM | POA: Diagnosis not present

## 2017-03-07 DIAGNOSIS — I10 Essential (primary) hypertension: Principal | ICD-10-CM

## 2017-03-07 DIAGNOSIS — F17210 Nicotine dependence, cigarettes, uncomplicated: Secondary | ICD-10-CM

## 2017-03-07 DIAGNOSIS — Z794 Long term (current) use of insulin: Secondary | ICD-10-CM

## 2017-03-07 DIAGNOSIS — J4540 Moderate persistent asthma, uncomplicated: Secondary | ICD-10-CM

## 2017-03-07 DIAGNOSIS — D509 Iron deficiency anemia, unspecified: Secondary | ICD-10-CM | POA: Diagnosis not present

## 2017-03-07 DIAGNOSIS — E119 Type 2 diabetes mellitus without complications: Secondary | ICD-10-CM | POA: Diagnosis present

## 2017-03-07 DIAGNOSIS — E1159 Type 2 diabetes mellitus with other circulatory complications: Secondary | ICD-10-CM | POA: Diagnosis present

## 2017-03-07 DIAGNOSIS — Z720 Tobacco use: Secondary | ICD-10-CM

## 2017-03-07 DIAGNOSIS — K219 Gastro-esophageal reflux disease without esophagitis: Secondary | ICD-10-CM

## 2017-03-07 DIAGNOSIS — Z0000 Encounter for general adult medical examination without abnormal findings: Secondary | ICD-10-CM

## 2017-03-07 DIAGNOSIS — E118 Type 2 diabetes mellitus with unspecified complications: Secondary | ICD-10-CM

## 2017-03-07 DIAGNOSIS — I152 Hypertension secondary to endocrine disorders: Secondary | ICD-10-CM

## 2017-03-07 DIAGNOSIS — E785 Hyperlipidemia, unspecified: Secondary | ICD-10-CM

## 2017-03-07 DIAGNOSIS — G4700 Insomnia, unspecified: Secondary | ICD-10-CM | POA: Diagnosis not present

## 2017-03-07 LAB — POCT GLYCOSYLATED HEMOGLOBIN (HGB A1C): Hemoglobin A1C: 6.1

## 2017-03-07 LAB — HM DIABETES EYE EXAM

## 2017-03-07 LAB — GLUCOSE, CAPILLARY: Glucose-Capillary: 96 mg/dL (ref 65–99)

## 2017-03-07 MED ORDER — GEMFIBROZIL 600 MG PO TABS: 600.0000 mg | ORAL_TABLET | Freq: Two times a day (BID) | ORAL | 11 refills | Status: DC

## 2017-03-07 MED ORDER — INSULIN ASPART PROT & ASPART (70-30 MIX) 100 UNIT/ML PEN: PEN_INJECTOR | SUBCUTANEOUS | 11 refills | Status: DC

## 2017-03-07 MED ORDER — NICOTINE POLACRILEX 4 MG MT GUM: 4.0000 mg | CHEWING_GUM | OROMUCOSAL | 0 refills | Status: DC | PRN

## 2017-03-07 MED ORDER — FERROUS SULFATE 325 (65 FE) MG PO TBEC: 325.0000 mg | DELAYED_RELEASE_TABLET | Freq: Every day | ORAL | 0 refills | Status: DC

## 2017-03-07 MED ORDER — NICOTINE 7 MG/24HR TD PT24: 7.0000 mg | MEDICATED_PATCH | Freq: Every day | TRANSDERMAL | 0 refills | Status: DC

## 2017-03-07 NOTE — Progress Notes (Signed)
CC: DM  HPI:  [name] is a 51 y.o. male with PMH of IDDM, HTN, GERD, TBI, Asthma, HLD, and tobacco use who presents for follow up management of his diabetes. Please see problem based charting for status of patient's chronic medical issues.  T2DM: Patient reports adherence to Metformin 1000 mg BID, Invokana 300 mg daily, and Novolog 70/30 28 units am and 24 units pm. Last A1c was 6.2 in January 2018. He checks his blood sugar 3 times daily at 8 am, 3 pm, and 8 pm. He tries to check his CBG before meals but does forget at times and checks after eating. He admits to sometimes not eating lunch. His CBGs ranged from 71 - 330s, variation not associated with a particular meal. He denies any hypo/hyperglycemic symptoms.  HTN: He is prescribed Enalapril 10 mg daily, however has not taken this for over a month. His BP was 130/77 this visit.  Insomnia: He is taking Amitriptyline 100 mg qhs with benefit.  Tobacco use: Patient smokes about 4- cigarettes daily, sometimes up to 10 daily. He would like assistance with cutting back further now.  Microcytic anemia: Last Ferritin was low at 7. I referred him for colonoscopy however this was not able to be scheduled. Hgb has normalized, however he microcytosis persists. He denies any obvious sign of bleeding, hematochezia, melena, or hematuria.  Moderate Persistent Asthma Patient reports a history of asthma for which he uses an albuterol inhaler. He says his breathing is good, but he is using his inhaler 3-4 times per week. Budesonide was added on last visit, however he has not been able to use this. He denies any current shortness of breath. No PFTs on file.  HLD: Currently prescribed Gemfibrozil 600 mg BID. Unclear if he has ever been on a statin. No lipid panel on file.  Healthcare Maintenance: Patient referred to GI for colonoscopy to evaluate iron deficiency anemia; he will be 51 later this month and eligible for screening colonoscopy. He  is due for an eye exam for diabetic retinopathy screening.  Past Medical History:  Diagnosis Date  . Asthma   . Hypertension   . TBI (traumatic brain injury) (HCC)   . Type 2 diabetes mellitus, uncontrolled (HCC) 2010    Review of Systems:   Review of Systems  Constitutional: Negative for chills and fever.  HENT: Negative for nosebleeds.   Respiratory: Negative for cough, sputum production and shortness of breath.   Cardiovascular: Negative for chest pain and leg swelling.  Gastrointestinal: Negative for blood in stool, melena, nausea and vomiting.  Genitourinary: Negative for hematuria.  Neurological: Negative for dizziness, loss of consciousness and headaches.    Physical Exam:  Vitals:   03/07/17 1450  BP: 130/77  Pulse: (!) 102  Temp: 97.6 F (36.4 C)  TempSrc: Oral  Weight: 208 lb (94.3 kg)   Physical Exam  Constitutional: He appears well-nourished. No distress.  HENT:  Head: Normocephalic and atraumatic.  Cardiovascular: Normal rate and regular rhythm.  Exam reveals no friction rub.   No murmur heard. Pulses:      Dorsalis pedis pulses are 2+ on the right side, and 2+ on the left side.  Pulmonary/Chest: Effort normal. No respiratory distress.  Coarse wheezing bilaterally. Speaking in full sentences, in no distress.  Musculoskeletal: Normal range of motion. He exhibits no edema or tenderness.  Skin: He is not diaphoretic.     Assessment & Plan:   See Encounters Tab for problem based charting.  Patient  discussed with Dr. Rogelia Boga  Type 2 diabetes mellitus (HCC) Repeat A1c this visit is 6.1. I advised patient to try his best to eat consistent meals and check his blood sugars before meals. We will decrease his am Novolog 70/30 to 26 units and continue 24 units pm. - Novolog 70/30 26 units am 24 units pm - Metformin 1000 mg BID - Invokana 300 mg daily - Retinal eye exam this visit - Repeat A1c in 3 months  Hypertension associated with diabetes (HCC) BP  Readings from Last 3 Encounters:  03/07/17 130/77  02/21/17 (!) 127/93  11/01/16 135/86   He is not taking the prescribed Enalapril however his BP is controlled. We will continue off antihypertensives for now and continue to monitor. Will check a urine microalbumin on follow up to assess for proteinuria in the setting of diabetes and see if an ACE-I/ARB may be of benefit.  Insomnia Well-controlled on Amitriptyline 100 mg qhs. Will continue current management.  Tobacco abuse Patient is motivated to cut back further on his smoking. He wants to try nicotine patches now. - Nicotine patches and gum prescribed - Counseled on continued smoking cessation  Microcytic anemia Previous lab work indicated an iron deficiency anemia with a low ferritin and microcytic anemia. Ferrous sulfate was added on last visit and repeat CBC shows a microcytosis without anemia. We will continue iron supplementation and work on getting him plugged in with GI for a colonoscopy. He will be eligible for a screening colonoscopy on 03/20/17 as well.  Asthma Patient does not have PFTs on file. He uses his albuterol inhaler 3-4 times per week. He was prescribed budesonide on last visit, however has not been able to use this. We are switching the budesonide to fluticasone and our pharmacy team has educated patient on proper use of inhalers. - Appreciate assistance from our pharmacy team - Albuterol prn - Fluticasone 1 puff BID - Obtain PFTs  Hyperlipidemia He has been taking Gemfibrozil, which I will refill. Unclear if he has ever been on a statin. We will try to obtain a fasting lipid panel on his next visit to assess his triglycerides.  Healthcare maintenance Patient will be eligible for screening colonoscopy later this month. He has already been referred to GI for evaluation of iron deficiency anemia. Retinal/fundus photography obtained this visit.

## 2017-03-07 NOTE — Progress Notes (Signed)
Medication Samples have been provided to the patient.  Drug: albuterol  Strength: 90 mcg/puff  Qty: 1 LOT: [id number] Exp.Date: 11/19 Dosing instructions: 2 puffs every 6 hours as needed for shortness of breath  The patient has been instructed regarding the correct time, dose, and frequency of taking this medication, including desired effects and most common side effects.   Marzetta BoardJennifer Kim 3:50 PM 03/07/2017

## 2017-03-07 NOTE — Patient Instructions (Addendum)
It was a pleasure to see you again [name].  Your Hemoglobin A1c is 6.1 today.  Please continue to check your blood sugars three times a day before meals. Please try to eat consistent meals.  Please decrease your morning insulin to 26 units daily.  Please continue your nighttime insulin at 24 units daily.  Please try to use your QVAR inhaler as instructed for your asthma. Use your albuterol as needed.  We are taking a picture of your eyes today.  Please try the nicotine patches and gum to help cut back on your smoking.  We will try to get you appointments to see the stomach doctors and for a lung test.  Please call us if you have any issues with your medications.

## 2017-03-08 LAB — BMP8+ANION GAP
Anion Gap: 15 mmol/L (ref 10.0–18.0)
BUN/Creatinine Ratio: 12 (ref 9–20)
BUN: 14 mg/dL (ref 6–24)
CHLORIDE: 97 mmol/L (ref 96–106)
CO2: 23 mmol/L (ref 18–29)
Calcium: 10.1 mg/dL (ref 8.7–10.2)
Creatinine, Ser: 1.19 mg/dL (ref 0.76–1.27)
GFR calc Af Amer: 82 mL/min/{1.73_m2} (ref >59)
GFR calc non Af Amer: 71 mL/min/{1.73_m2} (ref >59)
Glucose: 71 mg/dL (ref 65–99)
Potassium: 5.8 mmol/L — ABNORMAL HIGH (ref 3.5–5.2)
Sodium: 135 mmol/L (ref 134–144)

## 2017-03-08 LAB — CBC
HEMATOCRIT: 44.3 % (ref 37.5–51.0)
Hemoglobin: 14.8 g/dL (ref 13.0–17.7)
MCH: 25 pg — ABNORMAL LOW (ref 26.6–33.0)
MCHC: 33.4 g/dL (ref 31.5–35.7)
MCV: 75 fL — ABNORMAL LOW (ref 79–97)
Platelets: 325 10*3/uL (ref 150–379)
RBC: 5.93 x10E6/uL — ABNORMAL HIGH (ref 4.14–5.80)
RDW: 18.8 % — AB (ref 12.3–15.4)
WBC: 8.6 10*3/uL (ref 3.4–10.8)

## 2017-03-10 MED ORDER — FLUTICASONE PROPIONATE HFA 110 MCG/ACT IN AERO: 1.0000 | INHALATION_SPRAY | Freq: Two times a day (BID) | RESPIRATORY_TRACT | 11 refills | Status: DC

## 2017-03-10 NOTE — Assessment & Plan Note (Signed)
Previous lab work indicated an iron deficiency anemia with a low ferritin and microcytic anemia. Ferrous sulfate was added on last visit and repeat CBC shows a microcytosis without anemia. We will continue iron supplementation and work on getting him plugged in with GI for a colonoscopy. He will be eligible for a screening colonoscopy on 03/20/17 as well.

## 2017-03-10 NOTE — Assessment & Plan Note (Signed)
Well-controlled on Amitriptyline 100 mg qhs. Will continue current management.

## 2017-03-10 NOTE — Assessment & Plan Note (Signed)
Patient does not have PFTs on file. He uses his albuterol inhaler 3-4 times per week. He was prescribed budesonide on last visit, however has not been able to use this. We are switching the budesonide to fluticasone and our pharmacy team has educated patient on proper use of inhalers. - Appreciate assistance from our pharmacy team - Albuterol prn - Fluticasone 1 puff BID - Obtain PFTs

## 2017-03-10 NOTE — Assessment & Plan Note (Signed)
Repeat A1c this visit is 6.1. I advised patient to try his best to eat consistent meals and check his blood sugars before meals. We will decrease his am Novolog 70/30 to 26 units and continue 24 units pm. - Novolog 70/30 26 units am 24 units pm - Metformin 1000 mg BID - Invokana 300 mg daily - Retinal eye exam this visit - Repeat A1c in 3 months

## 2017-03-10 NOTE — Assessment & Plan Note (Signed)
BP Readings from Last 3 Encounters:  03/07/17 130/77  02/21/17 (!) 127/93  11/01/16 135/86   He is not taking the prescribed Enalapril however his BP is controlled. We will continue off antihypertensives for now and continue to monitor. Will check a urine microalbumin on follow up to assess for proteinuria in the setting of diabetes and see if an ACE-I/ARB may be of benefit.

## 2017-03-10 NOTE — Assessment & Plan Note (Signed)
He has been taking Gemfibrozil, which I will refill. Unclear if he has ever been on a statin. We will try to obtain a fasting lipid panel on his next visit to assess his triglycerides.

## 2017-03-10 NOTE — Assessment & Plan Note (Signed)
Patient will be eligible for screening colonoscopy later this month. He has already been referred to GI for evaluation of iron deficiency anemia. Retinal/fundus photography obtained this visit.

## 2017-03-10 NOTE — Assessment & Plan Note (Signed)
Patient is motivated to cut back further on his smoking. He wants to try nicotine patches now. - Nicotine patches and gum prescribed - Counseled on continued smoking cessation

## 2017-03-12 NOTE — Progress Notes (Addendum)
[facility] Attending  Case discussed with Dr. Patel,Vishal at the time of the visit.  We reviewed the resident's history and exam and pertinent patient test results.  I agree with the assessment, diagnosis, and plan of care documented in the resident's note.  

## 2017-03-23 ENCOUNTER — Encounter: Payer: Self-pay | Admitting: Dietician

## 2017-03-25 ENCOUNTER — Emergency Department (HOSPITAL_COMMUNITY): Payer: Medicaid Other

## 2017-03-25 ENCOUNTER — Emergency Department (HOSPITAL_COMMUNITY)
Admission: EM | Admit: 2017-03-25 | Discharge: 2017-03-25 | Disposition: A | Discharge status: Home / Self Care | Payer: Medicaid Other | Attending: Emergency Medicine | Admitting: Emergency Medicine

## 2017-03-25 ENCOUNTER — Encounter (HOSPITAL_COMMUNITY): Payer: Self-pay

## 2017-03-25 DIAGNOSIS — T07XXXA Unspecified multiple injuries, initial encounter: Secondary | ICD-10-CM | POA: Diagnosis not present

## 2017-03-25 DIAGNOSIS — I10 Essential (primary) hypertension: Secondary | ICD-10-CM | POA: Insufficient documentation

## 2017-03-25 DIAGNOSIS — F10920 Alcohol use, unspecified with intoxication, uncomplicated: Secondary | ICD-10-CM | POA: Diagnosis not present

## 2017-03-25 DIAGNOSIS — S0990XA Unspecified injury of head, initial encounter: Secondary | ICD-10-CM

## 2017-03-25 DIAGNOSIS — Z87820 Personal history of traumatic brain injury: Secondary | ICD-10-CM | POA: Insufficient documentation

## 2017-03-25 DIAGNOSIS — Y929 Unspecified place or not applicable: Secondary | ICD-10-CM | POA: Diagnosis not present

## 2017-03-25 DIAGNOSIS — Y9355 Activity, bike riding: Secondary | ICD-10-CM | POA: Insufficient documentation

## 2017-03-25 DIAGNOSIS — S0083XA Contusion of other part of head, initial encounter: Secondary | ICD-10-CM | POA: Diagnosis not present

## 2017-03-25 DIAGNOSIS — E119 Type 2 diabetes mellitus without complications: Secondary | ICD-10-CM | POA: Insufficient documentation

## 2017-03-25 DIAGNOSIS — S0292XA Unspecified fracture of facial bones, initial encounter for closed fracture: Secondary | ICD-10-CM | POA: Diagnosis not present

## 2017-03-25 DIAGNOSIS — Y999 Unspecified external cause status: Secondary | ICD-10-CM | POA: Insufficient documentation

## 2017-03-25 DIAGNOSIS — Z794 Long term (current) use of insulin: Secondary | ICD-10-CM | POA: Diagnosis not present

## 2017-03-25 DIAGNOSIS — Z7984 Long term (current) use of oral hypoglycemic drugs: Secondary | ICD-10-CM | POA: Diagnosis not present

## 2017-03-25 LAB — COMPREHENSIVE METABOLIC PANEL
ALBUMIN: 4.3 g/dL (ref 3.5–5.0)
ALT: 25 U/L (ref 17–63)
ANION GAP: 14 (ref 5–15)
AST: 46 U/L — ABNORMAL HIGH (ref 15–41)
Alkaline Phosphatase: 122 U/L (ref 38–126)
BUN: 17 mg/dL (ref 6–20)
CHLORIDE: 104 mmol/L (ref 101–111)
CO2: 17 mmol/L — AB (ref 22–32)
Calcium: 9 mg/dL (ref 8.9–10.3)
Creatinine, Ser: 1.02 mg/dL (ref 0.61–1.24)
GFR calc non Af Amer: >60 mL/min (ref >60)
Glucose, Bld: 82 mg/dL (ref 65–99)
Potassium: 5 mmol/L (ref 3.5–5.1)
SODIUM: 135 mmol/L (ref 135–145)
Total Bilirubin: 0.7 mg/dL (ref 0.3–1.2)
Total Protein: 7.3 g/dL (ref 6.5–8.1)

## 2017-03-25 LAB — CBC
HCT: 44.9 % (ref 39.0–52.0)
HEMOGLOBIN: 14.8 g/dL (ref 13.0–17.0)
MCH: 25.6 pg — AB (ref 26.0–34.0)
MCHC: 33 g/dL (ref 30.0–36.0)
MCV: 77.8 fL — AB (ref 78.0–100.0)
Platelets: 271 10*3/uL (ref 150–400)
RBC: 5.77 MIL/uL (ref 4.22–5.81)
RDW: 18.7 % — ABNORMAL HIGH (ref 11.5–15.5)
WBC: 11.5 10*3/uL — ABNORMAL HIGH (ref 4.0–10.5)

## 2017-03-25 LAB — ETHANOL: Alcohol, Ethyl (B): 120 mg/dL — ABNORMAL HIGH (ref <5)

## 2017-03-25 LAB — GLUCOSE, POCT (MANUAL RESULT ENTRY): POC GLUCOSE: 98 mg/dL (ref 70–99)

## 2017-03-25 MED ORDER — LORAZEPAM 1 MG PO TABS
1.0000 mg | ORAL_TABLET | Freq: Once | ORAL | Status: AC
Start: 2017-03-25 — End: 2017-03-25
  Administered 2017-03-25: 1 mg via ORAL
  Filled 2017-03-25: qty 1

## 2017-03-25 MED ORDER — TETANUS-DIPHTH-ACELL PERTUSSIS 5-2.5-18.5 LF-MCG/0.5 IM SUSP
0.5000 mL | Freq: Once | INTRAMUSCULAR | Status: AC
  Administered 2017-03-25: 0.5 mL via INTRAMUSCULAR
  Filled 2017-03-25: qty 0.5

## 2017-03-25 MED ORDER — OXYCODONE-ACETAMINOPHEN 5-325 MG PO TABS
2.0000 | ORAL_TABLET | Freq: Once | ORAL | Status: AC
  Administered 2017-03-25: 2 via ORAL
  Filled 2017-03-25: qty 2

## 2017-03-25 MED ORDER — PROMETHAZINE HCL 25 MG PO TABS: 25.0000 mg | ORAL_TABLET | Freq: Four times a day (QID) | ORAL | 0 refills | Status: DC | PRN

## 2017-03-25 MED ORDER — BACITRACIN ZINC 500 UNIT/GM EX OINT
1.0000 "application " | TOPICAL_OINTMENT | Freq: Two times a day (BID) | CUTANEOUS | Status: DC
  Administered 2017-03-25: 1 via TOPICAL
  Filled 2017-03-25 (×2): qty 0.9

## 2017-03-25 MED ORDER — SODIUM CHLORIDE 0.9 % IV BOLUS (SEPSIS)
1000.0000 mL | Freq: Once | INTRAVENOUS | Status: AC
  Administered 2017-03-25: 1000 mL via INTRAVENOUS

## 2017-03-25 MED ORDER — OXYCODONE-ACETAMINOPHEN 5-325 MG PO TABS: 1.0000 | ORAL_TABLET | Freq: Four times a day (QID) | ORAL | 0 refills | Status: DC | PRN

## 2017-03-25 MED ORDER — FENTANYL CITRATE (PF) 100 MCG/2ML IJ SOLN
50.0000 ug | Freq: Once | INTRAMUSCULAR | Status: AC
  Administered 2017-03-25: 50 ug via INTRAVENOUS
  Filled 2017-03-25: qty 2
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
## 2017-03-25 NOTE — ED Notes (Signed)
Bacitracin applied to all wounds.  Gauze applied to wounds as well.  Patient given instructions about wound care.
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
## 2017-04-09 ENCOUNTER — Encounter: Payer: Self-pay | Admitting: Gastroenterology

## 2017-04-20 ENCOUNTER — Emergency Department (HOSPITAL_COMMUNITY)
Admission: EM | Admit: 2017-04-20 | Discharge: 2017-04-20 | Disposition: A | Discharge status: Home / Self Care | Payer: Medicaid Other | Attending: Emergency Medicine | Admitting: Emergency Medicine

## 2017-04-20 ENCOUNTER — Encounter (HOSPITAL_COMMUNITY): Payer: Self-pay | Admitting: Emergency Medicine

## 2017-04-20 DIAGNOSIS — Z794 Long term (current) use of insulin: Secondary | ICD-10-CM | POA: Insufficient documentation

## 2017-04-20 DIAGNOSIS — F17210 Nicotine dependence, cigarettes, uncomplicated: Secondary | ICD-10-CM | POA: Insufficient documentation

## 2017-04-20 DIAGNOSIS — R51 Headache: Secondary | ICD-10-CM | POA: Insufficient documentation

## 2017-04-20 DIAGNOSIS — J45909 Unspecified asthma, uncomplicated: Secondary | ICD-10-CM | POA: Insufficient documentation

## 2017-04-20 DIAGNOSIS — I10 Essential (primary) hypertension: Secondary | ICD-10-CM | POA: Diagnosis not present

## 2017-04-20 DIAGNOSIS — E119 Type 2 diabetes mellitus without complications: Secondary | ICD-10-CM | POA: Insufficient documentation

## 2017-04-20 DIAGNOSIS — R519 Headache, unspecified: Secondary | ICD-10-CM

## 2017-04-20 DIAGNOSIS — Z79899 Other long term (current) drug therapy: Secondary | ICD-10-CM | POA: Insufficient documentation

## 2017-04-20 DIAGNOSIS — S0281XD Fracture of other specified skull and facial bones, right side, subsequent encounter for fracture with routine healing: Secondary | ICD-10-CM

## 2017-04-20 MED ORDER — IBUPROFEN 400 MG PO TABS
600.0000 mg | ORAL_TABLET | Freq: Once | ORAL | Status: AC
  Administered 2017-04-20: 12:00:00 600 mg via ORAL
  Filled 2017-04-20: qty 1

## 2017-04-20 MED ORDER — OXYCODONE-ACETAMINOPHEN 5-325 MG PO TABS
1.0000 | ORAL_TABLET | Freq: Once | ORAL | Status: AC
  Administered 2017-04-20: 1 via ORAL
  Filled 2017-04-20: qty 1

## 2017-04-20 MED ORDER — ACETAMINOPHEN 325 MG PO TABS: 650.0000 mg | ORAL_TABLET | Freq: Four times a day (QID) | ORAL | 0 refills | Status: DC | PRN

## 2017-04-20 MED ORDER — IBUPROFEN 600 MG PO TABS: 600.0000 mg | ORAL_TABLET | Freq: Four times a day (QID) | ORAL | 0 refills | Status: DC | PRN

## 2017-04-20 MED ORDER — TRAMADOL HCL 50 MG PO TABS: 50.0000 mg | ORAL_TABLET | Freq: Four times a day (QID) | ORAL | 0 refills | Status: DC | PRN
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
## 2017-05-07 ENCOUNTER — Other Ambulatory Visit: Payer: Self-pay | Admitting: Internal Medicine

## 2017-05-07 DIAGNOSIS — G4700 Insomnia, unspecified: Secondary | ICD-10-CM

## 2017-05-22 ENCOUNTER — Ambulatory Visit: Payer: Medicaid Other | Admitting: Gastroenterology

## 2017-05-22 NOTE — Addendum Note (Signed)
Addended by: Dorie Rank E on: 05/22/2017 07:00 PM   Modules accepted: Orders

## 2017-06-06 ENCOUNTER — Other Ambulatory Visit: Payer: Self-pay | Admitting: Internal Medicine

## 2017-06-06 ENCOUNTER — Encounter: Payer: Self-pay | Admitting: Internal Medicine

## 2017-06-06 ENCOUNTER — Ambulatory Visit (INDEPENDENT_AMBULATORY_CARE_PROVIDER_SITE_OTHER): Payer: Medicaid Other | Admitting: Internal Medicine

## 2017-06-06 VITALS — BP 122/76 | HR 98 | Temp 98.2°F | Ht 73.0 in | Wt 201.1 lb

## 2017-06-06 DIAGNOSIS — Z0000 Encounter for general adult medical examination without abnormal findings: Secondary | ICD-10-CM | POA: Diagnosis not present

## 2017-06-06 DIAGNOSIS — Z23 Encounter for immunization: Secondary | ICD-10-CM

## 2017-06-06 DIAGNOSIS — Z720 Tobacco use: Secondary | ICD-10-CM

## 2017-06-06 DIAGNOSIS — L600 Ingrowing nail: Secondary | ICD-10-CM | POA: Diagnosis not present

## 2017-06-06 DIAGNOSIS — E784 Other hyperlipidemia: Secondary | ICD-10-CM

## 2017-06-06 DIAGNOSIS — Z794 Long term (current) use of insulin: Secondary | ICD-10-CM

## 2017-06-06 DIAGNOSIS — E785 Hyperlipidemia, unspecified: Secondary | ICD-10-CM | POA: Diagnosis not present

## 2017-06-06 DIAGNOSIS — J4540 Moderate persistent asthma, uncomplicated: Secondary | ICD-10-CM | POA: Diagnosis not present

## 2017-06-06 DIAGNOSIS — E118 Type 2 diabetes mellitus with unspecified complications: Secondary | ICD-10-CM

## 2017-06-06 DIAGNOSIS — G4700 Insomnia, unspecified: Secondary | ICD-10-CM

## 2017-06-06 DIAGNOSIS — E1159 Type 2 diabetes mellitus with other circulatory complications: Secondary | ICD-10-CM

## 2017-06-06 DIAGNOSIS — E1169 Type 2 diabetes mellitus with other specified complication: Secondary | ICD-10-CM | POA: Diagnosis not present

## 2017-06-06 DIAGNOSIS — I10 Essential (primary) hypertension: Secondary | ICD-10-CM

## 2017-06-06 DIAGNOSIS — F17210 Nicotine dependence, cigarettes, uncomplicated: Secondary | ICD-10-CM

## 2017-06-06 DIAGNOSIS — K219 Gastro-esophageal reflux disease without esophagitis: Secondary | ICD-10-CM | POA: Diagnosis not present

## 2017-06-06 LAB — POCT GLYCOSYLATED HEMOGLOBIN (HGB A1C): Hemoglobin A1C: 6

## 2017-06-06 LAB — GLUCOSE, CAPILLARY: GLUCOSE-CAPILLARY: 90 mg/dL (ref 65–99)

## 2017-06-06 MED ORDER — NAPROXEN 500 MG PO TBEC: 500.0000 mg | DELAYED_RELEASE_TABLET | Freq: Two times a day (BID) | ORAL | 0 refills | Status: DC | PRN

## 2017-06-06 MED ORDER — AMITRIPTYLINE HCL 100 MG PO TABS: 100.0000 mg | ORAL_TABLET | Freq: Every day | ORAL | 0 refills | Status: DC

## 2017-06-06 MED ORDER — INSULIN ASPART PROT & ASPART (70-30 MIX) 100 UNIT/ML PEN: PEN_INJECTOR | SUBCUTANEOUS | 11 refills | Status: DC

## 2017-06-06 MED ORDER — ENALAPRIL MALEATE 10 MG PO TABS: 10.0000 mg | ORAL_TABLET | Freq: Every day | ORAL | Status: DC

## 2017-06-06 MED ORDER — PANTOPRAZOLE SODIUM 40 MG PO TBEC: 40.0000 mg | DELAYED_RELEASE_TABLET | Freq: Every day | ORAL | 1 refills | Status: DC

## 2017-06-06 NOTE — Assessment & Plan Note (Signed)
Patient agreeable to flu shot which is given this visit.
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
## 2017-06-06 NOTE — Assessment & Plan Note (Signed)
His blood pressure is well-controlled, 122/76 today. He apparently is taking his Enalapril, which we will continue.  - Continue Enalapril 10 mg daily

## 2017-06-06 NOTE — Assessment & Plan Note (Addendum)
Patient with a reported history of asthma, moderate persistent. PFTs were ordered, however these have not been completed yet. He reports shortness of breath after biking, but otherwise no dyspnea. - Continue Albuterol prn and 15 minutes before exercise - Continue Fluticasone 1 puff BID - f/u on PFTs

## 2017-06-06 NOTE — Patient Instructions (Addendum)
It was a pleasure to see you again [name].  We will try Naproxen for your pain. You can take this medicine twice a day if needed with meals for your pain.  I am referring you to a foot doctor for your ingrown toenail and continued foot care.  Your diabetes is well-controlled. Keep up the good work.  We will decrease your Novolog to 24 units in the morning and 24 units at night.  Please do your best to cut back on smoking.  We are giving you the flu shot today.   Ingrown Toenail An ingrown toenail occurs when the corner or sides of your toenail grow into the surrounding skin. The big toe is most commonly affected, but it can happen to any of your toes. If your ingrown toenail is not treated, you will be at risk for infection. What are the causes? This condition may be caused by:  Wearing shoes that are too small or tight.  Injury or trauma, such as stubbing your toe or having your toe stepped on.  Improper cutting or care of your toenails.  Being born with (congenital) nail or foot abnormalities, such as having a nail that is too big for your toe.  What increases the risk? Risk factors for an ingrown toenail include:  Age. Your nails tend to thicken as you get older, so ingrown nails are more common in older people.  Diabetes.  Cutting your toenails incorrectly.  Blood circulation problems.  What are the signs or symptoms? Symptoms may include:  Pain, soreness, or tenderness.  Redness.  Swelling.  Hardening of the skin surrounding the toe.  Your ingrown toenail may be infected if there is fluid, pus, or drainage. How is this diagnosed? An ingrown toenail may be diagnosed by medical history and physical exam. If your toenail is infected, your health care provider may test a sample of the drainage. How is this treated? Treatment depends on the severity of your ingrown toenail. Some ingrown toenails may be treated at home. More severe or infected ingrown toenails  may require surgery to remove all or part of the nail. Infected ingrown toenails may also be treated with antibiotic medicines. Follow these instructions at home:  If you were prescribed an antibiotic medicine, finish all of it even if you start to feel better.  Soak your foot in warm soapy water for 20 minutes, 3 times per day or as directed by your health care provider.  Carefully lift the edge of the nail away from the sore skin by wedging a small piece of cotton under the corner of the nail. This may help with the pain. Be careful not to cause more injury to the area.  Wear shoes that fit well. If your ingrown toenail is causing you pain, try wearing sandals, if possible.  Trim your toenails regularly and carefully. Do not cut them in a curved shape. Cut your toenails straight across. This prevents injury to the skin at the corners of the toenail.  Keep your feet clean and dry.  If you are having trouble walking and are given crutches by your health care provider, use them as directed.  Do not pick at your toenail or try to remove it yourself.  Take medicines only as directed by your health care provider.  Keep all follow-up visits as directed by your health care provider. This is important. Contact a health care provider if:  Your symptoms do not improve with treatment. Get help right away if:  You have red streaks that start at your foot and go up your leg.  You have a fever.  You have increased redness, swelling, or pain.  You have fluid, blood, or pus coming from your toenail. This information is not intended to replace advice given to you by your health care provider. Make sure you discuss any questions you have with your health care provider. Document Released: 09/15/2000 Document Revised: 02/18/2016 Document Reviewed: 08/12/2014 Elsevier Interactive Patient Education  [employer].

## 2017-06-06 NOTE — Assessment & Plan Note (Signed)
Repeat A1c this visit is 6.0. His diabetes is well-controlled and he denies any hypoglycemic symptoms. We will reduce his morning Novolog to 24 units and continue 24 units in the evening. - Novolog 70/30 24 units am 24 units pm - Metformin 1000 mg BID - Invokana 300 mg daily - Repeat A1c in 3 months

## 2017-06-06 NOTE — Assessment & Plan Note (Signed)
Patient continues to smoke 0.5 PPD. I have encouraged him to try to cut back further. He feels like the nicotine patches do help some. - Continue smoking cessation counseling, nicotine patches

## 2017-06-06 NOTE — Assessment & Plan Note (Signed)
Well-controlled on Amitriptyline 100 mg qhs without side effect. Will continue current management.

## 2017-06-06 NOTE — Assessment & Plan Note (Signed)
No lipid profile on file. Has been on Gemfibrozil, but does not appear to have been on a statin. Will obtain lipid panel to assess triglycerides. - Lipid panel

## 2017-06-06 NOTE — Assessment & Plan Note (Signed)
He has an ingrown toenail at the lateral nailbed of the left 1st toe with associated tenderness, inflammation, and swelling. No purulent discharge or overlying cellulitic change to suggest infection. He denies fevers or chills. - Advised patient to soak toe/foot in warm soap water for 20 minutes TID. Avoid manipulating toenail. - Refer to Podiatry as patient is diabetic

## 2017-06-07 LAB — LIPID PANEL
Chol/HDL Ratio: 4.9 ratio (ref 0.0–5.0)
Cholesterol, Total: 249 mg/dL — ABNORMAL HIGH (ref 100–199)
HDL: 51 mg/dL
LDL Calculated: 120 mg/dL — ABNORMAL HIGH (ref 0–99)
Triglycerides: 390 mg/dL — ABNORMAL HIGH (ref 0–149)
VLDL Cholesterol Cal: 78 mg/dL — ABNORMAL HIGH (ref 5–40)

## 2017-06-07 LAB — MICROALBUMIN / CREATININE URINE RATIO
Creatinine, Urine: 18.8 mg/dL
Microalb/Creat Ratio: 37.8 mg/g creat — ABNORMAL HIGH (ref 0.0–30.0)
Microalbumin, Urine: 7.1 ug/mL

## 2017-06-08 NOTE — Progress Notes (Signed)
Internal Medicine Clinic Attending  Case discussed with Dr. Patel at the time of the visit.  We reviewed the resident's history and exam and pertinent patient test results.  I agree with the assessment, diagnosis, and plan of care documented in the resident's note.  

## 2017-07-05 ENCOUNTER — Encounter (HOSPITAL_COMMUNITY): Payer: Self-pay

## 2017-07-05 ENCOUNTER — Emergency Department (HOSPITAL_COMMUNITY)
Admission: EM | Admit: 2017-07-05 | Discharge: 2017-07-06 | Disposition: A | Discharge status: Home / Self Care | Payer: Medicaid Other | Attending: Emergency Medicine | Admitting: Emergency Medicine

## 2017-07-05 ENCOUNTER — Emergency Department (HOSPITAL_COMMUNITY): Payer: Medicaid Other

## 2017-07-05 DIAGNOSIS — F10920 Alcohol use, unspecified with intoxication, uncomplicated: Secondary | ICD-10-CM | POA: Diagnosis not present

## 2017-07-05 DIAGNOSIS — Z79899 Other long term (current) drug therapy: Secondary | ICD-10-CM | POA: Insufficient documentation

## 2017-07-05 DIAGNOSIS — M542 Cervicalgia: Secondary | ICD-10-CM | POA: Diagnosis present

## 2017-07-05 DIAGNOSIS — F17210 Nicotine dependence, cigarettes, uncomplicated: Secondary | ICD-10-CM | POA: Diagnosis not present

## 2017-07-05 DIAGNOSIS — Z794 Long term (current) use of insulin: Secondary | ICD-10-CM | POA: Insufficient documentation

## 2017-07-05 DIAGNOSIS — E119 Type 2 diabetes mellitus without complications: Secondary | ICD-10-CM | POA: Diagnosis not present

## 2017-07-05 DIAGNOSIS — S0990XA Unspecified injury of head, initial encounter: Secondary | ICD-10-CM

## 2017-07-05 DIAGNOSIS — J45909 Unspecified asthma, uncomplicated: Secondary | ICD-10-CM | POA: Diagnosis not present

## 2017-07-05 DIAGNOSIS — R51 Headache: Secondary | ICD-10-CM | POA: Diagnosis not present

## 2017-07-05 DIAGNOSIS — I10 Essential (primary) hypertension: Secondary | ICD-10-CM | POA: Diagnosis not present

## 2017-07-05 HISTORY — DX: Alcohol abuse, uncomplicated: F10.10

## 2017-07-05 NOTE — ED Notes (Signed)
Pt continues to rest comfortably.  
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
## 2017-07-06 NOTE — ED Notes (Signed)
Pt ambulated in hallway without assistance. Pt was steady on his feet.

## 2017-07-06 NOTE — ED Provider Notes (Signed)
Patient signed out at end of shift by Burna Forts, PA-C, for observation until clinically sober. The patient has slept through the night without complaint or instability.  6:50 - On re-exam the patient is awake and alert. He has no pain, is oriented, pleasant. Review of CT's are found to be negative for acute finding after fall. He can be discharged home and is comfortable with this disposition.    Elpidio Anis, PA-C 07/06/17 [phone]

## 2017-07-12 ENCOUNTER — Ambulatory Visit: Payer: Self-pay | Admitting: Podiatry

## 2017-08-07 ENCOUNTER — Encounter (INDEPENDENT_AMBULATORY_CARE_PROVIDER_SITE_OTHER): Payer: Self-pay | Admitting: Podiatry

## 2017-08-07 NOTE — Addendum Note (Signed)
Addended by: Neomia DearPOWERS, Hisao Doo E on: 08/07/2017 06:11 PM   Modules accepted: Orders

## 2017-08-07 NOTE — Progress Notes (Signed)
This encounter was created in error - please disregard.

## 2017-09-03 ENCOUNTER — Other Ambulatory Visit: Payer: Self-pay | Admitting: Internal Medicine

## 2017-09-03 DIAGNOSIS — E118 Type 2 diabetes mellitus with unspecified complications: Secondary | ICD-10-CM

## 2017-09-03 DIAGNOSIS — Z794 Long term (current) use of insulin: Principal | ICD-10-CM

## 2017-09-03 DIAGNOSIS — G4700 Insomnia, unspecified: Secondary | ICD-10-CM

## 2017-09-05 ENCOUNTER — Telehealth: Payer: Self-pay | Admitting: *Deleted

## 2017-09-05 ENCOUNTER — Ambulatory Visit: Payer: Medicaid Other | Admitting: Internal Medicine

## 2017-09-05 ENCOUNTER — Other Ambulatory Visit: Payer: Self-pay

## 2017-09-05 ENCOUNTER — Encounter: Payer: Self-pay | Admitting: Internal Medicine

## 2017-09-05 VITALS — BP 154/94 | HR 102 | Temp 97.8°F | Wt 201.8 lb

## 2017-09-05 DIAGNOSIS — G4700 Insomnia, unspecified: Secondary | ICD-10-CM

## 2017-09-05 DIAGNOSIS — I152 Hypertension secondary to endocrine disorders: Secondary | ICD-10-CM

## 2017-09-05 DIAGNOSIS — R000 Tachycardia, unspecified: Secondary | ICD-10-CM | POA: Diagnosis not present

## 2017-09-05 DIAGNOSIS — Z7951 Long term (current) use of inhaled steroids: Secondary | ICD-10-CM | POA: Diagnosis not present

## 2017-09-05 DIAGNOSIS — F1020 Alcohol dependence, uncomplicated: Secondary | ICD-10-CM | POA: Insufficient documentation

## 2017-09-05 DIAGNOSIS — R252 Cramp and spasm: Secondary | ICD-10-CM | POA: Diagnosis not present

## 2017-09-05 DIAGNOSIS — K219 Gastro-esophageal reflux disease without esophagitis: Secondary | ICD-10-CM

## 2017-09-05 DIAGNOSIS — F17210 Nicotine dependence, cigarettes, uncomplicated: Secondary | ICD-10-CM

## 2017-09-05 DIAGNOSIS — E1159 Type 2 diabetes mellitus with other circulatory complications: Secondary | ICD-10-CM

## 2017-09-05 DIAGNOSIS — J4540 Moderate persistent asthma, uncomplicated: Secondary | ICD-10-CM | POA: Diagnosis not present

## 2017-09-05 DIAGNOSIS — E785 Hyperlipidemia, unspecified: Secondary | ICD-10-CM

## 2017-09-05 DIAGNOSIS — Z79899 Other long term (current) drug therapy: Secondary | ICD-10-CM

## 2017-09-05 DIAGNOSIS — Z7289 Other problems related to lifestyle: Secondary | ICD-10-CM

## 2017-09-05 DIAGNOSIS — Z23 Encounter for immunization: Secondary | ICD-10-CM | POA: Diagnosis not present

## 2017-09-05 DIAGNOSIS — Z794 Long term (current) use of insulin: Secondary | ICD-10-CM

## 2017-09-05 DIAGNOSIS — E118 Type 2 diabetes mellitus with unspecified complications: Secondary | ICD-10-CM

## 2017-09-05 DIAGNOSIS — E1169 Type 2 diabetes mellitus with other specified complication: Secondary | ICD-10-CM | POA: Diagnosis not present

## 2017-09-05 DIAGNOSIS — Z789 Other specified health status: Secondary | ICD-10-CM

## 2017-09-05 DIAGNOSIS — I10 Essential (primary) hypertension: Secondary | ICD-10-CM

## 2017-09-05 DIAGNOSIS — Z0000 Encounter for general adult medical examination without abnormal findings: Secondary | ICD-10-CM

## 2017-09-05 DIAGNOSIS — Z720 Tobacco use: Secondary | ICD-10-CM

## 2017-09-05 LAB — GLUCOSE, CAPILLARY: Glucose-Capillary: 105 mg/dL — ABNORMAL HIGH (ref 65–99)

## 2017-09-05 LAB — POCT GLYCOSYLATED HEMOGLOBIN (HGB A1C): HEMOGLOBIN A1C: 5.7

## 2017-09-05 MED ORDER — CANAGLIFLOZIN 300 MG PO TABS: 300.0000 mg | ORAL_TABLET | Freq: Every day | ORAL | 3 refills | Status: DC

## 2017-09-05 MED ORDER — AMITRIPTYLINE HCL 100 MG PO TABS: 100.0000 mg | ORAL_TABLET | Freq: Every day | ORAL | 1 refills | Status: DC

## 2017-09-05 MED ORDER — INSULIN ASPART PROT & ASPART (70-30 MIX) 100 UNIT/ML PEN: PEN_INJECTOR | SUBCUTANEOUS | 11 refills | Status: DC

## 2017-09-05 MED ORDER — ENALAPRIL MALEATE 10 MG PO TABS: 10.0000 mg | ORAL_TABLET | Freq: Every day | ORAL | 1 refills | Status: DC

## 2017-09-05 MED ORDER — PANTOPRAZOLE SODIUM 40 MG PO TBEC: 40.0000 mg | DELAYED_RELEASE_TABLET | Freq: Every day | ORAL | 1 refills | Status: DC

## 2017-09-05 MED ORDER — ATORVASTATIN CALCIUM 40 MG PO TABS: 40.0000 mg | ORAL_TABLET | Freq: Every day | ORAL | 3 refills | Status: DC

## 2017-09-05 MED ORDER — METFORMIN HCL 1000 MG PO TABS: 1000.0000 mg | ORAL_TABLET | Freq: Two times a day (BID) | ORAL | 3 refills | Status: DC

## 2017-09-05 NOTE — Progress Notes (Signed)
CC: Diabetes  HPI:  [name] is a 51 y.o. male with PMH of IDDM, HTN, GERD, TBI, Asthma, HLD, and tobacco use who presents for follow up management of his diabetes.  T2DM: He is currently taking Novolog 70/30 24 units am and 24 units pm. He is also taking Metformin 1000 mg BID and Canagliflozin 300 mg daily.  HTN: He is currently prescribed Enalapril 10 mg daily. BP on arrival was elevated to 176/100. He is unsure if he has been taking his Enalapril.  Moderate Persistent Asthma: He currently uses Albuterol as needed and Fluticasone 110 mcg/act 1 puff twice daily. He denies active shortness of breath.  HLD: He has been taking Gemfibrozil 600 mg BID. Triglycerides last visit were 390. It does not appear he has been on a statin before.  Tobacco Use: He continues to smoke 0.5 PPD. He rolls his own cigarettes. He acknowledges that he needs to quit and says his brother has also encouraged him to quit.  GERD: Patient reports a flare up of his heartburn since he ran out of his Pantoprazole. He says the Pantoprazole does provide relief.  Alcohol Use: He reports drinking up to twelve 12 oz beers daily.   Insomnia: He has been taking Amitriptyline 100 mg qhs which allows him to sleep well.  Healthcare Maintenance: He is eligible for Pneumovax (PSV23) given his diabetes and tobacco use.   Past Medical History:  Diagnosis Date  . Asthma   . ETOH abuse   . Hypertension   . TBI (traumatic brain injury) (HCC)   . Type 2 diabetes mellitus, uncontrolled (HCC) 2010   Review of Systems:   Review of Systems  Constitutional: Negative for chills and fever.  Respiratory: Negative for shortness of breath.   Cardiovascular: Negative for chest pain and leg swelling.  Gastrointestinal: Negative for abdominal pain.  Musculoskeletal:       Occasional leg cramping  Psychiatric/Behavioral:       Alcohol and tobacco use     Physical Exam:  Vitals:   09/05/17 1333 09/05/17 1413    BP: (!) 176/100 (!) 154/94  Pulse: (!) 105 (!) 102  Temp: 97.8 F (36.6 C)   TempSrc: Oral   SpO2: 100%   Weight: 201 lb 12.8 oz (91.5 kg)    General: no acute distress Cardiac: slight tachycardia, no rubs, murmurs or gallops Pulm: faint end-expiratory wheezing lower lung fields Abd: soft, nontender, nondistended, BS present Ext: warm and well perfused, no pedal edema, DP pulses +2 b/l Neuro: alert and oriented X3   Assessment & Plan:   See Encounters Tab for problem based charting.  Patient discussed with Dr. Josem KaufmannKlima  Type 2 diabetes mellitus (HCC) A1c this visit is 5.7. His diabetes is well-controlled. Will reduce his Novolog Mix 70/30 to 20 units twice daily. - Novolog Mix 70/30 20 units am 20 units pm - Metformin 1000 mg BID - Invokana 300 mg daily  Hypertension associated with diabetes (HCC) His BP is elevated this visit to 154/94 on recheck. It seems that he has not been taking his enalapril. He also reports significant alcohol use which can contribute to his hypertension. - Restart Enalapril 10 mg daily - Encouraged cutting back on alcohol use  Moderate persistent asthma He is having occasional shortness of breath. He does ride a bike through town at which times he notices more trouble breathing. We will continue his current medications for now and consider step down on his fluticasone dose if able. - Continue Albuterol  prn and 15 minutes before exercise/biking - Continue Fluticasone 110 mcg/act 1 puff BID  Hyperlipidemia associated with type 2 diabetes mellitus (HCC) Lab Results  Component Value Date   CHOL 249 (H) 06/06/2017   HDL 51 06/06/2017   LDLCALC 120 (H) 06/06/2017   TRIG 390 (H) 06/06/2017   CHOLHDL 4.9 06/06/2017   He has a 10 year ASCVD risk of 19.9% and would benefit from a high-intensity statin. Will d/c Gemfibrozil and start Atorvastatin 40 mg daily. He is also encouraged to adjust lifestyle (diet, exercise, tobacco/alcohol use).  Tobacco  abuse He continues to smoke 0.5 PPD. He acknowledges wanting to cut back further as his brother has encouraged to him to quit smoking. He is counseled on smoking cessation and can contact us for further assistance if needed.  GERD (gastroesophageal reflux disease) He has had recurrent reflux symptoms after running out of his Pantoprazole. Will refill Pantoprazole 40 mg daily 30 minutes prior to meal. He is also counseled on tobacco and alcohol use which are likely contributing to his reflux.  Alcohol use He reports drinking up to a twelve pack of beer on most days but denies daily alcohol use. I spoke to him about the effects of excess alcohol use and he is willing to cut back on his alcohol use.  Insomnia Well-controlled on Amitriptyline 100 mg qhs without adverse effect. Will continue current management.  Healthcare maintenance Pneumovax PPSV23 given this visit.

## 2017-09-05 NOTE — Patient Instructions (Signed)
It was a pleasure to see you [name].  For your Diabetes: Decrease your insulin to 20 units with breakfast and 20 units with supper. Continue your Invokana and Metformin.  For you blood pressure please take Enalapril 10 mg daily.  For your cholesterol, stop the Gemfibrozil and start Atorvastatin (Lipitor) 40 mg once daily.  Please try your best to cut back on smoking and alcohol use.  I have refilled your heartburn medication.  Please follow up with me in 3 months or sooner if needed.   Alcohol Use Disorder Alcohol use disorder is when your drinking disrupts your daily life. When you have this condition, you drink too much alcohol and you cannot control your drinking. Alcohol use disorder can cause serious problems with your physical health. It can affect your brain, heart, liver, pancreas, immune system, stomach, and intestines. Alcohol use disorder can increase your risk for certain cancers and cause problems with your mental health, such as depression, anxiety, psychosis, delirium, and dementia. People with this disorder risk hurting themselves and others. What are the causes? This condition is caused by drinking too much alcohol over time. It is not caused by drinking too much alcohol only one or two times. Some people with this condition drink alcohol to cope with or escape from negative life events. Others drink to relieve pain or symptoms of mental illness. What increases the risk? You are more likely to develop this condition if:  You have a family history of alcohol use disorder.  Your culture encourages drinking to the point of intoxication, or makes alcohol easy to get.  You had a mood or conduct disorder in childhood.  You have been a victim of abuse.  You are an adolescent and: ? You have poor grades or difficulties in school. ? Your caregivers do not talk to you about saying no to alcohol, or supervise your activities. ? You are impulsive or you have trouble with  self-control.  What are the signs or symptoms? Symptoms of this condition include:  Drinkingmore than you want to.  Drinking for longer than you want to.  Trying several times to drink less or to control your drinking.  Spending a lot of time getting alcohol, drinking, or recovering from drinking.  Craving alcohol.  Having problems at work, at school, or at home due to drinking.  Having problems in relationships due to drinking.  Drinking when it is dangerous to drink, such as before driving a car.  Continuing to drink even though you know you might have a physical or mental problem related to drinking.  Needing more and more alcohol to get the same effect you want from the alcohol (building up tolerance).  Having symptoms of withdrawal when you stop drinking. Symptoms of withdrawal include: ? Fatigue. ? Nightmares. ? Trouble sleeping. ? Depression. ? Anxiety. ? Fever. ? Seizures. ? Severe confusion. ? Feeling or seeing things that are not there (hallucinations). ? Tremors. ? Rapid heart rate. ? Rapid breathing. ? High blood pressure.  Drinking to avoid symptoms of withdrawal.  How is this diagnosed? This condition is diagnosed with an assessment. Your health care provider may start the assessment by asking three or four questions about your drinking. Your health care provider may perform a physical exam or do lab tests to see if you have physical problems resulting from alcohol use. She or he may refer you to a mental health professional for evaluation. How is this treated? Some people with alcohol use disorder are  able to reduce their alcohol use to low-risk levels. Others need to completely quit drinking alcohol. When necessary, mental health professionals with specialized training in substance use treatment can help. Your health care provider can help you decide how severe your alcohol use disorder is and what type of treatment you need. The following forms of  treatment are available:  Detoxification. Detoxification involves quitting drinking and using prescription medicines within the first week to help lessen withdrawal symptoms. This treatment is important for people who have had withdrawal symptoms before and for heavy drinkers who are likely to have withdrawal symptoms. Alcohol withdrawal can be dangerous, and in severe cases, it can cause death. Detoxification may be provided in a home, community, or primary care setting, or in a hospital or substance use treatment facility.  Counseling. This treatment is also called talk therapy. It is provided by substance use treatment counselors. A counselor can address the reasons you use alcohol and suggest ways to keep you from drinking again or to prevent problem drinking. The goals of talk therapy are to: ? Find healthy activities and ways for you to cope with stress. ? Identify and avoid the things that trigger your alcohol use. ? Help you learn how to handle cravings.  Medicines.Medicines can help treat alcohol use disorder by: ? Decreasing alcohol cravings. ? Decreasing the positive feeling you have when you drink alcohol. ? Causing an uncomfortable physical reaction when you drink alcohol (aversion therapy).  Support groups. Support groups are led by people who have quit drinking. They provide emotional support, advice, and guidance.  These forms of treatment are often combined. Some people with this condition benefit from a combination of treatments provided by specialized substance use treatment centers. Follow these instructions at home:  Take over-the-counter and prescription medicines only as told by your health care provider.  Check with your health care provider before starting any new medicines.  Ask friends and family members not to offer you alcohol.  Avoid situations where alcohol is served, including gatherings where others are drinking alcohol.  Create a plan for what to do when  you are tempted to use alcohol.  Find hobbies or activities that you enjoy that do not include alcohol.  Keep all follow-up visits as told by your health care provider. This is important. How is this prevented?  If you drink, limit alcohol intake to no more than 1 drink a day for nonpregnant women and 2 drinks a day for men. One drink equals 12 oz of beer, 5 oz of wine, or 1 oz of hard liquor.  If you have a mental health condition, get treatment and support.  Do not give alcohol to adolescents.  If you are an adolescent: ? Do not drink alcohol. ? Do not be afraid to say no if someone offers you alcohol. Speak up about why you do not want to drink. You can be a positive role model for your friends and set a good example for those around you by not drinking alcohol. ? If your friends drink, spend time with others who do not drink alcohol. Make new friends who do not use alcohol. ? Find healthy ways to manage stress and emotions, such as meditation or deep breathing, exercise, spending time in nature, listening to music, or talking with a trusted friend or family member. Contact a health care provider if:  You are not able to take your medicines as told.  Your symptoms get worse.  You return to drinking alcohol (  relapse) and your symptoms get worse. Get help right away if:  You have thoughts about hurting yourself or others. If you ever feel like you may hurt yourself or others, or have thoughts about taking your own life, get help right away. You can go to your nearest emergency department or call:  Your local emergency services (911 in the [place]  A suicide crisis helpline, such as the National Suicide Prevention Lifeline at 56205312791-519-797-6830. This is open 24 hours a day.  Summary  Alcohol use disorder is when your drinking disrupts your daily life. When you have this condition, you drink too much alcohol and you cannot control your drinking.  Treatment may include detoxification,  counseling, medicine, and support groups.  Ask friends and family members not to offer you alcohol. Avoid situations where alcohol is served.  Get help right away if you have thoughts about hurting yourself or others. This information is not intended to replace advice given to you by your health care provider. Make sure you discuss any questions you have with your health care provider. Document Released: 10/26/2004 Document Revised: 06/15/2016 Document Reviewed: 06/15/2016 Elsevier Interactive Patient Education  [employer]2018 [employer]

## 2017-09-05 NOTE — Telephone Encounter (Signed)
CALLED PATIENT LVM FOR  PATIENT TO CALL [facility] REGARDING HIS EYE REFERRAL. HAD APPOINTMENT 2-20-018 , R/S 3/018 WITH [facility] AND NO SHOWED FOR THE APPOINTMENT.

## 2017-09-10 NOTE — Assessment & Plan Note (Signed)
He continues to smoke 0.5 PPD. He acknowledges wanting to cut back further as his brother has encouraged to him to quit smoking. He is counseled on smoking cessation and can contact us for further assistance if needed.

## 2017-09-10 NOTE — Assessment & Plan Note (Signed)
He is having occasional shortness of breath. He does ride a bike through town at which times he notices more trouble breathing. We will continue his current medications for now and consider step down on his fluticasone dose if able. - Continue Albuterol prn and 15 minutes before exercise/biking - Continue Fluticasone 110 mcg/act 1 puff BID

## 2017-09-10 NOTE — Assessment & Plan Note (Signed)
Lab Results  Component Value Date   CHOL 249 (H) 06/06/2017   HDL 51 06/06/2017   LDLCALC 120 (H) 06/06/2017   TRIG 390 (H) 06/06/2017   CHOLHDL 4.9 06/06/2017   He has a 10 year ASCVD risk of 19.9% and would benefit from a high-intensity statin. Will d/c Gemfibrozil and start Atorvastatin 40 mg daily. He is also encouraged to adjust lifestyle (diet, exercise, tobacco/alcohol use).

## 2017-09-10 NOTE — Assessment & Plan Note (Signed)
Pneumovax PPSV23 given this visit.

## 2017-09-10 NOTE — Assessment & Plan Note (Signed)
He has had recurrent reflux symptoms after running out of his Pantoprazole. Will refill Pantoprazole 40 mg daily 30 minutes prior to meal. He is also counseled on tobacco and alcohol use which are likely contributing to his reflux.

## 2017-09-10 NOTE — Assessment & Plan Note (Signed)
Well-controlled on Amitriptyline 100 mg qhs without adverse effect. Will continue current management.

## 2017-09-10 NOTE — Progress Notes (Signed)
Case discussed with Dr. Patel at the time of the visit.  We reviewed the resident's history and exam and pertinent patient test results.  I agree with the assessment, diagnosis, and plan of care documented in the resident's note. 

## 2017-09-10 NOTE — Assessment & Plan Note (Signed)
A1c this visit is 5.7. His diabetes is well-controlled. Will reduce his Novolog Mix 70/30 to 20 units twice daily. - Novolog Mix 70/30 20 units am 20 units pm - Metformin 1000 mg BID - Invokana 300 mg daily

## 2017-09-10 NOTE — Assessment & Plan Note (Signed)
He reports drinking up to a twelve pack of beer on most days but denies daily alcohol use. I spoke to him about the effects of excess alcohol use and he is willing to cut back on his alcohol use.

## 2017-09-10 NOTE — Assessment & Plan Note (Signed)
His BP is elevated this visit to 154/94 on recheck. It seems that he has not been taking his enalapril. He also reports significant alcohol use which can contribute to his hypertension. - Restart Enalapril 10 mg daily - Encouraged cutting back on alcohol use

## 2017-11-02 ENCOUNTER — Encounter: Payer: Self-pay | Admitting: Internal Medicine

## 2017-11-02 NOTE — Addendum Note (Signed)
Addended by: Neomia DearPOWERS, Jasime Westergren E on: 11/02/2017 08:27 PM   Modules accepted: Orders

## 2017-11-19 LAB — HM DIABETES EYE EXAM

## 2017-12-05 ENCOUNTER — Other Ambulatory Visit: Payer: Self-pay

## 2017-12-05 ENCOUNTER — Encounter (INDEPENDENT_AMBULATORY_CARE_PROVIDER_SITE_OTHER): Payer: Self-pay

## 2017-12-05 ENCOUNTER — Encounter: Payer: Self-pay | Admitting: Internal Medicine

## 2017-12-05 ENCOUNTER — Ambulatory Visit: Payer: Medicaid Other | Admitting: Internal Medicine

## 2017-12-05 VITALS — BP 149/91 | HR 110 | Temp 98.1°F | Ht 73.0 in | Wt 195.7 lb

## 2017-12-05 DIAGNOSIS — G4700 Insomnia, unspecified: Secondary | ICD-10-CM | POA: Diagnosis not present

## 2017-12-05 DIAGNOSIS — F1099 Alcohol use, unspecified with unspecified alcohol-induced disorder: Secondary | ICD-10-CM

## 2017-12-05 DIAGNOSIS — Z720 Tobacco use: Secondary | ICD-10-CM | POA: Diagnosis not present

## 2017-12-05 DIAGNOSIS — J45909 Unspecified asthma, uncomplicated: Secondary | ICD-10-CM | POA: Diagnosis not present

## 2017-12-05 DIAGNOSIS — I10 Essential (primary) hypertension: Secondary | ICD-10-CM

## 2017-12-05 DIAGNOSIS — E1159 Type 2 diabetes mellitus with other circulatory complications: Secondary | ICD-10-CM

## 2017-12-05 DIAGNOSIS — Z794 Long term (current) use of insulin: Secondary | ICD-10-CM

## 2017-12-05 DIAGNOSIS — E118 Type 2 diabetes mellitus with unspecified complications: Secondary | ICD-10-CM | POA: Diagnosis not present

## 2017-12-05 DIAGNOSIS — Z79899 Other long term (current) drug therapy: Secondary | ICD-10-CM | POA: Diagnosis not present

## 2017-12-05 DIAGNOSIS — E1169 Type 2 diabetes mellitus with other specified complication: Secondary | ICD-10-CM

## 2017-12-05 DIAGNOSIS — Z87820 Personal history of traumatic brain injury: Secondary | ICD-10-CM

## 2017-12-05 DIAGNOSIS — K219 Gastro-esophageal reflux disease without esophagitis: Secondary | ICD-10-CM

## 2017-12-05 DIAGNOSIS — E785 Hyperlipidemia, unspecified: Secondary | ICD-10-CM | POA: Diagnosis not present

## 2017-12-05 LAB — POCT GLYCOSYLATED HEMOGLOBIN (HGB A1C): HEMOGLOBIN A1C: 5.6

## 2017-12-05 LAB — GLUCOSE, CAPILLARY: Glucose-Capillary: 76 mg/dL (ref 65–99)

## 2017-12-05 MED ORDER — INSULIN ASPART PROT & ASPART (70-30 MIX) 100 UNIT/ML PEN: PEN_INJECTOR | SUBCUTANEOUS | 11 refills | Status: DC

## 2017-12-05 MED ORDER — AMITRIPTYLINE HCL 100 MG PO TABS: 100.0000 mg | ORAL_TABLET | Freq: Every day | ORAL | 1 refills | Status: DC

## 2017-12-05 MED ORDER — ENALAPRIL MALEATE 10 MG PO TABS: 10.0000 mg | ORAL_TABLET | Freq: Every day | ORAL | 1 refills | Status: DC

## 2017-12-05 MED ORDER — PANTOPRAZOLE SODIUM 40 MG PO TBEC: 40.0000 mg | DELAYED_RELEASE_TABLET | Freq: Every day | ORAL | 1 refills | Status: DC

## 2017-12-05 NOTE — Progress Notes (Signed)
CC: Diabetes  HPI:  Ruben Pierce is a 52 y.o. male with T2DM, HTN, TBI, Asthma, HLD, tobacco and alcohol use who presents for follow up management of his diabetes. Please see problem based charting for status of patient's chronic medical issues.  Type 2 diabetes mellitus (HCC) His last A1c was 5.7. His Novolog 70/30 was reduced to 20 units twice daily on last visit to which he reports adherence. He continues to take Metformin 1000 mg BID and Invokana 300 mg daily. He did not bring his meter but says he checks his CBGs three times a day with most readings in the 80s. He does report seeing some readings in the mid to high 60s. He denies any symptoms of hyperglycemia or hypoglycemia.  A/P: Repeat A1c is 5.6. His diabetes is likely over controlled and I suspect he is having occasional hypoglycemic events. Will reduce his insulin further as below: - Decrease Novolog 70/30 to 10 units am 10 units pm - Continue Metformin 1000 mg BID - Continue Invokana 300 mg daily - May be able to come off insulin completely in the future - repeat A1c in 3 months  Hypertension associated with diabetes (HCC) He is prescribed Enalapril 10 mg daily. He brought all of his medications this visit which does not include Enalapril which he does not appear to be taking. BP on arrival was 149/91. BP Readings from Last 3 Encounters:  12/05/17 (!) 149/91  09/05/17 (!) 154/94  07/06/17 (!) 133/114  A/P: His BP is elevated in the setting on not taking his BP medications. - Restart Enalapril 10 mg daily   Hyperlipidemia associated with type 2 diabetes mellitus (HCC) He was started on Atorvastatin 40 mg daily on last visit. He reports adherence and tolerance. A/P: Continue high-intensity Atorvastatin 40 mg daily.  Insomnia He has been taking Amitriptyline 100 mg qhs for insomnia with benefit. He denies any adverse side effects. A/P: Insomnia is well-controlled on Amitriptyline 100 mg qhs. Will continue current  management.  GERD (gastroesophageal reflux disease) He has been taking Pantoprazole 40 mg daily for reflux with benefit. A/P: His GERD is well-controlled with PPI therapy. Will continue current management. He is encouraged to cut back on tobacco and alcohol use which can contribute to reflux.    Past Medical History:  Diagnosis Date  . Asthma   . ETOH abuse   . Hypertension   . TBI (traumatic brain injury) (HCC)   . Type 2 diabetes mellitus, uncontrolled (HCC) 2010   Review of Systems:   Review of Systems  Respiratory: Negative for cough and shortness of breath.   Cardiovascular: Negative for chest pain and leg swelling.  Musculoskeletal: Negative for falls.  Neurological: Negative for dizziness and loss of consciousness.     Physical Exam:  Vitals:   12/05/17 1454  BP: (!) 149/91  Pulse: (!) 110  Temp: 98.1 F (36.7 C)  TempSrc: Oral  SpO2: 99%  Weight: 195 lb 11.2 oz (88.8 kg)  Height: 6\' 1"  (1.854 m)   Physical Exam  Constitutional: He is oriented to person, place, and time. He appears well-nourished. No distress.  Cardiovascular: Regular rhythm.  No murmur heard. Slight tachycardia  Pulmonary/Chest: Effort normal. No stridor. No respiratory distress.  Faint end-expiratory wheezing bilateral lower lung fields  Musculoskeletal: He exhibits no edema or tenderness.  Neurological: He is alert and oriented to person, place, and time.  Skin: Skin is warm. He is not diaphoretic.    Assessment & Plan:   See Encounters  Tab for problem based charting.  Patient discussed with Dr. Rogelia Boga

## 2017-12-05 NOTE — Patient Instructions (Signed)
It was a pleasure to see you again Ruben Pierce.  Please decrease your Insulin Novolog 70/30 as follows: - 10 units in the morning and 10 units at night  Please start Enalapril 10 mg once a day for blood pressure.  Please work on cutting back on tobacco and alcohol use.  Please follow up with us again in 3 months or sooner if needed.

## 2017-12-06 NOTE — Assessment & Plan Note (Signed)
He has been taking Pantoprazole 40 mg daily for reflux with benefit. A/P: His GERD is well-controlled with PPI therapy. Will continue current management. He is encouraged to cut back on tobacco and alcohol use which can contribute to reflux.

## 2017-12-06 NOTE — Assessment & Plan Note (Signed)
His last A1c was 5.7. His Novolog 70/30 was reduced to 20 units twice daily on last visit to which he reports adherence. He continues to take Metformin 1000 mg BID and Invokana 300 mg daily. He did not bring his meter but says he checks his CBGs three times a day with most readings in the 80s. He does report seeing some readings in the mid to high 60s. He denies any symptoms of hyperglycemia or hypoglycemia.  A/P: Repeat A1c is 5.6. His diabetes is likely over controlled and I suspect he is having occasional hypoglycemic events. Will reduce his insulin further as below: - Decrease Novolog 70/30 to 10 units am 10 units pm - Continue Metformin 1000 mg BID - Continue Invokana 300 mg daily - May be able to come off insulin completely in the future - repeat A1c in 3 months

## 2017-12-06 NOTE — Assessment & Plan Note (Signed)
He is prescribed Enalapril 10 mg daily. He brought all of his medications this visit which does not include Enalapril which he does not appear to be taking. BP on arrival was 149/91. BP Readings from Last 3 Encounters:  12/05/17 (!) 149/91  09/05/17 (!) 154/94  07/06/17 (!) 133/114  A/P: His BP is elevated in the setting on not taking his BP medications. - Restart Enalapril 10 mg daily

## 2017-12-06 NOTE — Assessment & Plan Note (Signed)
He was started on Atorvastatin 40 mg daily on last visit. He reports adherence and tolerance. A/P: Continue high-intensity Atorvastatin 40 mg daily.

## 2017-12-06 NOTE — Assessment & Plan Note (Signed)
He has been taking Amitriptyline 100 mg qhs for insomnia with benefit. He denies any adverse side effects. A/P: Insomnia is well-controlled on Amitriptyline 100 mg qhs. Will continue current management.

## 2017-12-07 NOTE — Progress Notes (Signed)
Internal Medicine Clinic Attending  Case discussed with Dr. Patel at the time of the visit.  We reviewed the resident's history and exam and pertinent patient test results.  I agree with the assessment, diagnosis, and plan of care documented in the resident's note.  

## 2018-01-08 ENCOUNTER — Other Ambulatory Visit: Payer: Self-pay | Admitting: *Deleted

## 2018-01-08 DIAGNOSIS — Z794 Long term (current) use of insulin: Principal | ICD-10-CM

## 2018-01-08 DIAGNOSIS — E118 Type 2 diabetes mellitus with unspecified complications: Secondary | ICD-10-CM

## 2018-01-08 MED ORDER — GLUCOSE BLOOD VI STRP: ORAL_STRIP | 12 refills | Status: DC

## 2018-02-26 ENCOUNTER — Emergency Department (HOSPITAL_COMMUNITY)
Admission: EM | Admit: 2018-02-26 | Discharge: 2018-02-27 | Disposition: A | Discharge status: Home / Self Care | Payer: Medicaid Other | Attending: Emergency Medicine | Admitting: Emergency Medicine

## 2018-02-26 ENCOUNTER — Emergency Department (HOSPITAL_COMMUNITY): Payer: Medicaid Other

## 2018-02-26 ENCOUNTER — Encounter (HOSPITAL_COMMUNITY): Payer: Self-pay | Admitting: Emergency Medicine

## 2018-02-26 ENCOUNTER — Other Ambulatory Visit: Payer: Self-pay

## 2018-02-26 DIAGNOSIS — Y999 Unspecified external cause status: Secondary | ICD-10-CM | POA: Insufficient documentation

## 2018-02-26 DIAGNOSIS — S0003XA Contusion of scalp, initial encounter: Secondary | ICD-10-CM | POA: Insufficient documentation

## 2018-02-26 DIAGNOSIS — Y9355 Activity, bike riding: Secondary | ICD-10-CM | POA: Diagnosis not present

## 2018-02-26 DIAGNOSIS — Y929 Unspecified place or not applicable: Secondary | ICD-10-CM | POA: Diagnosis not present

## 2018-02-26 DIAGNOSIS — Z79899 Other long term (current) drug therapy: Secondary | ICD-10-CM | POA: Insufficient documentation

## 2018-02-26 DIAGNOSIS — J45909 Unspecified asthma, uncomplicated: Secondary | ICD-10-CM | POA: Diagnosis not present

## 2018-02-26 DIAGNOSIS — E119 Type 2 diabetes mellitus without complications: Secondary | ICD-10-CM | POA: Insufficient documentation

## 2018-02-26 DIAGNOSIS — F17210 Nicotine dependence, cigarettes, uncomplicated: Secondary | ICD-10-CM | POA: Diagnosis not present

## 2018-02-26 DIAGNOSIS — Z794 Long term (current) use of insulin: Secondary | ICD-10-CM | POA: Insufficient documentation

## 2018-02-26 DIAGNOSIS — F10920 Alcohol use, unspecified with intoxication, uncomplicated: Secondary | ICD-10-CM | POA: Diagnosis not present

## 2018-02-26 DIAGNOSIS — S0990XA Unspecified injury of head, initial encounter: Secondary | ICD-10-CM | POA: Diagnosis present

## 2018-02-26 LAB — CBG MONITORING, ED: GLUCOSE-CAPILLARY: 79 mg/dL (ref 65–99)
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
## 2018-02-26 NOTE — ED Notes (Signed)
Pt removed C-collar 

## 2018-02-26 NOTE — ED Notes (Signed)
Patient transported to x-ray. ?
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
## 2018-02-27 NOTE — ED Notes (Signed)
Pt given blue scrub pants, bus pass, and socks. Discharged to lobby until bus starts running

## 2018-03-13 ENCOUNTER — Encounter: Payer: Self-pay | Admitting: Internal Medicine

## 2018-03-13 ENCOUNTER — Ambulatory Visit: Payer: Self-pay | Admitting: Dietician

## 2018-03-20 ENCOUNTER — Emergency Department (HOSPITAL_COMMUNITY)
Admission: EM | Admit: 2018-03-20 | Discharge: 2018-03-20 | Disposition: A | Discharge status: Home / Self Care | Payer: Medicaid Other | Attending: Emergency Medicine | Admitting: Emergency Medicine

## 2018-03-20 ENCOUNTER — Encounter (HOSPITAL_COMMUNITY): Payer: Self-pay | Admitting: Emergency Medicine

## 2018-03-20 ENCOUNTER — Emergency Department (HOSPITAL_COMMUNITY): Payer: Medicaid Other

## 2018-03-20 DIAGNOSIS — R05 Cough: Secondary | ICD-10-CM | POA: Insufficient documentation

## 2018-03-20 DIAGNOSIS — Z87820 Personal history of traumatic brain injury: Secondary | ICD-10-CM | POA: Insufficient documentation

## 2018-03-20 DIAGNOSIS — F17210 Nicotine dependence, cigarettes, uncomplicated: Secondary | ICD-10-CM | POA: Diagnosis not present

## 2018-03-20 DIAGNOSIS — Z794 Long term (current) use of insulin: Secondary | ICD-10-CM | POA: Insufficient documentation

## 2018-03-20 DIAGNOSIS — I10 Essential (primary) hypertension: Secondary | ICD-10-CM | POA: Insufficient documentation

## 2018-03-20 DIAGNOSIS — E119 Type 2 diabetes mellitus without complications: Secondary | ICD-10-CM | POA: Insufficient documentation

## 2018-03-20 DIAGNOSIS — Z79899 Other long term (current) drug therapy: Secondary | ICD-10-CM | POA: Diagnosis not present

## 2018-03-20 DIAGNOSIS — J45909 Unspecified asthma, uncomplicated: Secondary | ICD-10-CM | POA: Diagnosis not present

## 2018-03-20 DIAGNOSIS — R059 Cough, unspecified: Secondary | ICD-10-CM

## 2018-03-20 MED ORDER — BENZONATATE 100 MG PO CAPS: 100.0000 mg | ORAL_CAPSULE | Freq: Three times a day (TID) | ORAL | 0 refills | Status: DC | PRN

## 2018-03-20 NOTE — ED Notes (Signed)
Patient able to ambulate independently  

## 2018-03-20 NOTE — ED Notes (Signed)
PA at bedside.

## 2018-03-20 NOTE — ED Provider Notes (Signed)
[facility]   CSN: [id number] Arrival date & time: 03/20/18  1559     History   Chief Complaint Chief Complaint  Patient presents with  . Cough    HPI Ruben Pierce is a 52 y.o. male with history of asthma, EtOH abuse, hypertension, type 2 diabetes mellitus, GERD, asthma presents for evaluation of acute onset, progressively worsening productive cough since last night.  He notes cough is productive of clear sputum.  He denies fever, chills, shortness of breath, nausea, vomiting, nasal congestion, ear pain, or sore throat.  He does note dull anterior chest pain which occurs with cough only, denies exertional or pleuritic chest pain.  He attempted to take a liquid cough medicine earlier today which he states "went down the wrong pipe".  He denies vomiting.  He has not tried anything for his symptoms.  He notes that his blood sugars have been well controlled between 80-90 when he checks it daily.  No recent travel or surgeries, no hemoptysis, no prior history of DVT or PE, he is not on testosterone hormonal placement therapy.  He is a current smoker of a pack of cigarettes daily.  The history is provided by the patient.    Past Medical History:  Diagnosis Date  . Asthma   . ETOH abuse   . Hypertension   . TBI (traumatic brain injury) (HCC)   . Type 2 diabetes mellitus, uncontrolled (HCC) 2010    Patient Active Problem List   Diagnosis Date Noted  . Alcohol use 09/05/2017  . Healthcare maintenance 05/24/2016  . GERD (gastroesophageal reflux disease) 05/24/2016  . Atypical angina (HCC) 01/24/2016  . Moderate persistent asthma 01/24/2016  . Tobacco abuse 01/24/2016  . Microcytic anemia 01/24/2016  . Hyperlipidemia associated with type 2 diabetes mellitus (HCC) 11/11/2015  . Insomnia 11/11/2015  . Type 2 diabetes mellitus (HCC) 11/08/2015  . Hypertension associated with diabetes (HCC) 11/08/2015  . TBI (traumatic brain injury)  [facility](HCC)     Past Surgical History:  Procedure Laterality Date  . BRAIN SURGERY  1999   Head injury, fell from tree 25 feet  . BUNIONECTOMY Right 2014        Home Medications    Prior to Admission medications   Medication Sig Start Date End Date Taking? Authorizing Provider  ACCU-CHEK SOFTCLIX LANCETS lancets USE AS DIRECTED 11/01/16   Darreld McleanPatel, Vishal, MD  acetaminophen (TYLENOL) 325 MG tablet Take 2 tablets (650 mg total) by mouth every 6 (six) hours as needed. 04/20/17   Audry PiliMohr, Tyler, PA-C  amitriptyline (ELAVIL) 100 MG tablet Take 1 tablet (100 mg total) by mouth at bedtime. 12/05/17   Darreld McleanPatel, Vishal, MD  atorvastatin (LIPITOR) 40 MG tablet Take 1 tablet (40 mg total) by mouth daily. 09/05/17   Darreld McleanPatel, Vishal, MD  benzonatate (TESSALON) 100 MG capsule Take 1 capsule (100 mg total) by mouth 3 (three) times daily as needed for cough. 03/20/18   Luevenia MaxinFawze, Jasin Brazel A, PA-C  canagliflozin (INVOKANA) 300 MG TABS tablet Take 1 tablet (300 mg total) by mouth daily before breakfast. 09/05/17   Darreld McleanPatel, Vishal, MD  enalapril (VASOTEC) 10 MG tablet Take 1 tablet (10 mg total) by mouth daily. 12/05/17   Darreld McleanPatel, Vishal, MD  ferrous sulfate 325 (65 FE) MG EC tablet Take 1 tablet (325 mg total) by mouth daily with breakfast. 03/07/17 03/07/18  Darreld McleanPatel, Vishal, MD  fluticasone (FLOVENT HFA) 110 MCG/ACT inhaler Inhale 1 puff into the lungs 2 (two) times daily. Rinse  mouth after use. Pharmacist pls offer inhaler teaching 03/10/17   Darreld Mclean, MD  glucose blood (ACCU-CHEK AVIVA) test strip Use as instructed 01/08/18   Darreld Mclean, MD  ibuprofen (ADVIL,MOTRIN) 600 MG tablet Take 1 tablet (600 mg total) by mouth every 6 (six) hours as needed. 04/20/17   Audry Pili, PA-C  insulin aspart protamine - aspart (NOVOLOG MIX 70/30 FLEXPEN) (70-30) 100 UNIT/ML FlexPen Inject 10 units in the morning and 10 units in the evening 12/05/17   Darreld Mclean, MD  Insulin Pen Needle 31G X 5 MM MISC 1 Units by Does not apply route as directed. 11/01/16    Darreld Mclean, MD  Melatonin 1 MG TABS Take 1 tablet (1 mg total) by mouth Nightly. 03/08/16   Hyacinth Meeker, MD  metFORMIN (GLUCOPHAGE) 1000 MG tablet Take 1 tablet (1,000 mg total) by mouth 2 (two) times daily with a meal. 09/05/17   Darreld Mclean, MD  nicotine (NICODERM CQ - DOSED IN MG/24 HR) 7 mg/24hr patch Place 1 patch (7 mg total) onto the skin daily. 03/07/17   Darreld Mclean, MD  nicotine polacrilex (NICORETTE) 4 MG gum Take 1 each (4 mg total) by mouth as needed for smoking cessation. 03/07/17   Darreld Mclean, MD  pantoprazole (PROTONIX) 40 MG tablet Take 1 tablet (40 mg total) by mouth daily. 12/05/17   Darreld Mclean, MD  PROAIR HFA 108 [phone] Base) MCG/ACT inhaler INHALE 2 PUFFS INTO THE LUNGS EVERY 6 HOURS AS NEEDED FOR WHEEZING OR SHORTNESS OF BREATH 12/01/16   Darreld Mclean, MD    Family History Family History  Problem Relation Age of Onset  . CAD Mother   . Diabetes Mellitus II Father   . Hypertension Father   . Hyperlipidemia Father     Social History Social History   Tobacco Use  . Smoking status: Current Every Day Smoker    Packs/day: 0.75    Types: Cigarettes  . Smokeless tobacco: Never Used  Substance Use Topics  . Alcohol use: Yes    Alcohol/week: 0.0 oz    Comment: Beer occasionally.  . Drug use: No     Allergies   Patient has no known allergies.   Review of Systems Review of Systems  Constitutional: Negative for chills and fever.  HENT: Negative for sore throat.   Respiratory: Positive for cough. Negative for shortness of breath.   Cardiovascular: Positive for chest pain (With cough only).  Gastrointestinal: Negative for abdominal pain, nausea and vomiting.  Neurological: Negative for headaches.  All other systems reviewed and are negative.    Physical Exam Updated Vital Signs BP (!) 146/86 (BP Location: Right Arm)   Pulse 98   Temp 98.5 F (36.9 C) (Oral)   Resp 16   Ht 6\' 1"  (1.854 m)   Wt 88.5 kg (195 lb)   SpO2 100%   BMI 25.73 kg/m    Physical Exam  Constitutional: He appears well-developed and well-nourished. No distress.  HENT:  Head: Normocephalic and atraumatic.  TMs without erythema or bulging bilaterally.  Nasal septum midline without mucosal edema.  No frontal or maxillary sinus tenderness.  Posterior oropharynx without erythema, tonsillar hypertrophy, exudates, or uvular deviation.  No trismus or sublingual abnormalities.  He is tolerating secretions without difficulty.  Eyes: Conjunctivae are normal. Right eye exhibits no discharge. Left eye exhibits no discharge.  Neck: Normal range of motion. Neck supple. No JVD present. No tracheal deviation present.  Cardiovascular: Normal rate, regular rhythm, normal heart sounds and intact distal  pulses.  2+ radial and DP/PT pulses bl, negative Homan's bl, no LE edema  Pulmonary/Chest: Effort normal. He has wheezes. He exhibits tenderness.  Equal rise and fall of chest, no increased work of breathing.  Speaking in full sentences without difficulty.  Mild scattered expiratory wheezes on auscultation of the lungs.  Mild tenderness to palpation of the anterior chest wall bilaterally with no deformity, crepitus, ecchymosis, or flail segment noted.  Abdominal: Soft. Bowel sounds are normal. He exhibits no distension. There is no tenderness. There is no guarding.  Musculoskeletal: He exhibits no edema.  Lymphadenopathy:    He has no cervical adenopathy.  Neurological: He is alert.  Skin: Skin is warm and dry. No erythema.  Psychiatric: He has a normal mood and affect. His behavior is normal.  Nursing note and vitals reviewed.    ED Treatments / Results  Labs (all labs ordered are listed, but only abnormal results are displayed) Labs Reviewed - No data to display  EKG None  Radiology Dg Chest 2 View  Result Date: 03/20/2018 CLINICAL DATA:  Cough times 10 days EXAM: CHEST - 2 VIEW COMPARISON:  03/25/2017 CXR, CT cervical spine for the lung apices from 03/25/2017  FINDINGS: The heart size and mediastinal contours are within normal limits. Biapical pleuroparenchymal densities compatible with scarring is redemonstrated. The visualized skeletal structures are unremarkable. IMPRESSION: No active cardiopulmonary disease. Biapical pleuroparenchymal scarring, right greater than left as before. Electronically Signed   By: Tollie Eth M.D.   On: 03/20/2018 18:03    Procedures Procedures (including critical care time)  Medications Ordered in ED Medications - No data to display   Initial Impression / Assessment and Plan / ED Course  I have reviewed the triage vital signs and the nursing notes.  Pertinent labs & imaging results that were available during my care of the patient were reviewed by me and considered in my medical decision making (see chart for details).     Patient with productive cough since last night.  He is afebrile, initially mildly hypertensive but apparently was coughing quite frequently at the time.  His tachycardia has resolved.  Remainder of vitals are at patient's baseline.  He is nontoxic in appearance, no significant cough on my assessment and no increased work of breathing.  No constitutional symptoms.  Radiographs reviewed by me show no evidence of acute cardiopulmonary disease but he does have biapical pleural-parenchymal scarring which was noted on prior imaging. Suspect wheezing on exam is from his long-standing history of smoking.  Offered breathing treatment but he declined and he has no shortness of breath at this time.  Doubt PE in the absence of shortness of breath, hypoxia, or increased work of breathing.  Chest pain is reproducible on palpation, appears to be musculoskeletal in etiology and only present during cough.  I doubt ACS/MI or other acute cardiac etiology of symptoms.  No further emergent work-up required at this time.  Will discharge with Tessalon for cough.  Recommend follow-up with PCP and he states he has an appointment  tomorrow.  Discussed strict ED return precautions. Pt verbalized understanding of and agreement with plan and is safe for discharge home at this time.   Final Clinical Impressions(s) / ED Diagnoses   Final diagnoses:  Cough    ED Discharge Orders        Ordered    benzonatate (TESSALON) 100 MG capsule  3 times daily PRN     03/20/18 1815       Micah Barnier,  Nolberto Hanlon 03/20/18 Natividad Brood, MD 03/20/18 [id number]

## 2018-03-20 NOTE — ED Notes (Signed)
Pa at bedside updating patient 

## 2018-03-20 NOTE — ED Triage Notes (Signed)
Pt presents to ER with [facility] for cough and feeling of choking; pt states he has had a cough all day, took some cough syrup just PTA and thinks it "went down the wrong pipe" and cough got worse; pt states he feels better now, VS stable; lung sounds clear

## 2018-03-20 NOTE — Discharge Instructions (Signed)
Start taking Tessalon as needed for cough.  Drink plenty of fluids and get plenty of rest.  Continue taking all of your home medicines as prescribed.  Follow-up with your PCP tomorrow as scheduled.  Return to the emergency department immediately for any concerning signs or symptoms develop such as worsening chest pain, shortness of breath, fevers, or coughing up blood.

## 2018-03-21 ENCOUNTER — Ambulatory Visit: Payer: Medicaid Other | Admitting: Internal Medicine

## 2018-03-21 ENCOUNTER — Encounter: Payer: Self-pay | Admitting: Internal Medicine

## 2018-03-21 ENCOUNTER — Other Ambulatory Visit: Payer: Self-pay

## 2018-03-21 VITALS — BP 132/72 | HR 97 | Temp 98.2°F | Ht 73.0 in | Wt 180.9 lb

## 2018-03-21 DIAGNOSIS — Z7984 Long term (current) use of oral hypoglycemic drugs: Secondary | ICD-10-CM | POA: Diagnosis not present

## 2018-03-21 DIAGNOSIS — Z720 Tobacco use: Secondary | ICD-10-CM

## 2018-03-21 DIAGNOSIS — J4540 Moderate persistent asthma, uncomplicated: Secondary | ICD-10-CM

## 2018-03-21 DIAGNOSIS — Z7289 Other problems related to lifestyle: Secondary | ICD-10-CM | POA: Diagnosis not present

## 2018-03-21 DIAGNOSIS — Z79899 Other long term (current) drug therapy: Secondary | ICD-10-CM

## 2018-03-21 DIAGNOSIS — Z794 Long term (current) use of insulin: Secondary | ICD-10-CM

## 2018-03-21 DIAGNOSIS — E118 Type 2 diabetes mellitus with unspecified complications: Secondary | ICD-10-CM

## 2018-03-21 DIAGNOSIS — L602 Onychogryphosis: Secondary | ICD-10-CM

## 2018-03-21 DIAGNOSIS — K219 Gastro-esophageal reflux disease without esophagitis: Secondary | ICD-10-CM | POA: Diagnosis not present

## 2018-03-21 DIAGNOSIS — I152 Hypertension secondary to endocrine disorders: Secondary | ICD-10-CM

## 2018-03-21 DIAGNOSIS — E785 Hyperlipidemia, unspecified: Secondary | ICD-10-CM | POA: Diagnosis not present

## 2018-03-21 DIAGNOSIS — Z7951 Long term (current) use of inhaled steroids: Secondary | ICD-10-CM | POA: Diagnosis not present

## 2018-03-21 DIAGNOSIS — I10 Essential (primary) hypertension: Secondary | ICD-10-CM | POA: Diagnosis not present

## 2018-03-21 DIAGNOSIS — Z87820 Personal history of traumatic brain injury: Secondary | ICD-10-CM | POA: Diagnosis not present

## 2018-03-21 DIAGNOSIS — Z789 Other specified health status: Secondary | ICD-10-CM

## 2018-03-21 DIAGNOSIS — E1159 Type 2 diabetes mellitus with other circulatory complications: Secondary | ICD-10-CM

## 2018-03-21 LAB — POCT GLYCOSYLATED HEMOGLOBIN (HGB A1C): Hemoglobin A1C: 5.6 % (ref 4.0–5.6)

## 2018-03-21 LAB — GLUCOSE, CAPILLARY: GLUCOSE-CAPILLARY: 105 mg/dL — AB (ref 65–99)

## 2018-03-21 MED ORDER — ALBUTEROL SULFATE HFA 108 (90 BASE) MCG/ACT IN AERS: INHALATION_SPRAY | RESPIRATORY_TRACT | 6 refills | Status: DC

## 2018-03-21 MED ORDER — FLUTICASONE PROPIONATE HFA 110 MCG/ACT IN AERO: 1.0000 | INHALATION_SPRAY | Freq: Two times a day (BID) | RESPIRATORY_TRACT | 11 refills | Status: DC

## 2018-03-21 MED ORDER — CANAGLIFLOZIN 300 MG PO TABS: 300.0000 mg | ORAL_TABLET | Freq: Every day | ORAL | 3 refills | Status: DC

## 2018-03-21 MED ORDER — PANTOPRAZOLE SODIUM 40 MG PO TBEC: 40.0000 mg | DELAYED_RELEASE_TABLET | Freq: Every day | ORAL | 1 refills | Status: DC

## 2018-03-21 MED ORDER — BENZONATATE 100 MG PO CAPS: 100.0000 mg | ORAL_CAPSULE | Freq: Three times a day (TID) | ORAL | 0 refills | Status: DC | PRN

## 2018-03-21 NOTE — Assessment & Plan Note (Signed)
Patient reports drinking up to two 12 packs of beer on most days but not daily use.  He says he decided to quit 2 days ago and has not had a drink since then.  He denies any tremors, shakes, nausea, vomiting, or hallucinations. A/P: Patient with significant alcohol intake with sudden cessation.  Does not report any withdrawal symptoms but may be at high risk.  He does not exhibit any tremors, asterixis, or limit his coordination on my exam.  He denies any hallucinations.  I advised him to seek medical attention if he develops any of these are generally feels unwell.

## 2018-03-21 NOTE — Assessment & Plan Note (Signed)
Patient reports a deformity of his toenail on the second toe of his right foot.  He says it is occasionally bothersome to him. A/P: Hypertrophic dark toenail second toe of his right foot without invasion into the adjacent skin.  I will refer him to podiatry for further care.

## 2018-03-21 NOTE — Assessment & Plan Note (Signed)
Patient has moderate persistent asthma.  He says he does not have any shortness of breath and has not needed to use his rescue inhaler recently.  He is currently prescribed Flovent 110 mcg 1 puff twice daily and albuterol as needed. A/P: Patient with moderate persistent asthma.  Although he is not having symptoms he does have wheezing on exam.  I suspect he has not had his Flovent as he is now using a different pharmacy from which he was originally prescribed.  I have refilled both his Flovent and his rescue inhaler to his new pharmacy.  He has cut back on his smoking and says he is only smoking 1 cigarette but not every day. -Flovent 110 mcg 1 puff twice daily -Albuterol inhaler as needed -Counseled on complete smoking cessation

## 2018-03-21 NOTE — Assessment & Plan Note (Signed)
He is currently taking enalapril 10 mg daily.  His blood pressure is 132/72 today. BP Readings from Last 3 Encounters:  03/21/18 132/72  03/20/18 (!) 146/86  02/27/18 109/74   A/P: His blood pressure is well controlled. We will continue enalapril 10 mg daily.

## 2018-03-21 NOTE — Assessment & Plan Note (Signed)
He has been taking pantoprazole 40 mg daily for reflux with benefit. A/P: His GERD is well controlled with PPI.  We will continue current management.  If he is able to maintain abstinence from alcohol he may have improvement in his reflux symptoms and potentially could come off PPI therapy in the future.

## 2018-03-21 NOTE — Patient Instructions (Addendum)
It was a pleasure to see you [name].  Please stop your insulin. Continue Invokana and Metformin.   I have refilled your medication and inhalers. Please take as prescribed.  I am glad you quit drinking. Please seek medical attention if you have shakes, sweats, hallucinations, or feeling unwell.  Follow up with us in 4 weeks with your meter to see how your sugars are running.

## 2018-03-21 NOTE — Progress Notes (Signed)
CC: Diabetes  HPI:  Ruben Pierce is a 52 y.o. male with T2DM, HTN, TBI, Asthma, HLD, tobacco, and alcohol use who presents for follow up management of his DM. Please see problem based charting for status of patient's chronic medical issues.  Type 2 diabetes mellitus (HCC) His last A1c was 5.6.  His NovoLog 70/30 was reduced to 10 units twice a day from 20 units twice a day.  He is also taking metformin 1000 mg twice daily and Invokana 300 mg daily.  He reports adherence to these medications.  He did not bring his meter and says his blood sugars have been mostly in the 100s.  Says he did have a low 60s. A/P: Repeat A1c is 5.6.  His diabetes is well controlled.  I will discontinue his insulin given his good glycemic control and high potential for hypoglycemia.  We will continue his metformin and Invokana as prescribed.  I have asked to return in 4 weeks with his meter to ensure that his glycemic control stays well controlled.  He will need a repeat A1c in 3 months.  Hypertension associated with diabetes (HCC) He is currently taking enalapril 10 mg daily.  His blood pressure is 132/72 today. BP Readings from Last 3 Encounters:  03/21/18 132/72  03/20/18 (!) 146/86  02/27/18 109/74   A/P: His blood pressure is well controlled. We will continue enalapril 10 mg daily.  Moderate persistent asthma Patient has moderate persistent asthma.  He says he does not have any shortness of breath and has not needed to use his rescue inhaler recently.  He is currently prescribed Flovent 110 mcg 1 puff twice daily and albuterol as needed. A/P: Patient with moderate persistent asthma.  Although he is not having symptoms he does have wheezing on exam.  I suspect he has not had his Flovent as he is now using a different pharmacy from which he was originally prescribed.  I have refilled both his Flovent and his rescue inhaler to his new pharmacy.  He has cut back on his smoking and says he is only smoking 1  cigarette but not every day. -Flovent 110 mcg 1 puff twice daily -Albuterol inhaler as needed -Counseled on complete smoking cessation  Alcohol use Patient reports drinking up to two 12 packs of beer on most days but not daily use.  He says he decided to quit 2 days ago and has not had a drink since then.  He denies any tremors, shakes, nausea, vomiting, or hallucinations. A/P: Patient with significant alcohol intake with sudden cessation.  Does not report any withdrawal symptoms but may be at high risk.  He does not exhibit any tremors, asterixis, or limit his coordination on my exam.  He denies any hallucinations.  I advised him to seek medical attention if he develops any of these are generally feels unwell.  GERD (gastroesophageal reflux disease) He has been taking pantoprazole 40 mg daily for reflux with benefit. A/P: His GERD is well controlled with PPI.  We will continue current management.  If he is able to maintain abstinence from alcohol he may have improvement in his reflux symptoms and potentially could come off PPI therapy in the future.  Hypertrophic toenail Patient reports a deformity of his toenail on the second toe of his right foot.  He says it is occasionally bothersome to him. A/P: Hypertrophic dark toenail second toe of his right foot without invasion into the adjacent skin.  I will refer him to podiatry for  further care.    Past Medical History:  Diagnosis Date  . Asthma   . ETOH abuse   . Hypertension   . TBI (traumatic brain injury) (HCC)   . Type 2 diabetes mellitus, uncontrolled (HCC) 2010   Review of Systems:   Review of Systems  Constitutional: Negative for diaphoresis and fever.  Respiratory: Negative for shortness of breath.   Cardiovascular: Negative for chest pain.  Gastrointestinal: Positive for heartburn.  Musculoskeletal: Negative for falls.  Skin:       Discolored toenail of the second toe of the right foot  Neurological: Negative for  dizziness, tremors and loss of consciousness.     Physical Exam:  Vitals:   03/21/18 0948  BP: 132/72  Pulse: 97  Temp: 98.2 F (36.8 C)  TempSrc: Oral  SpO2: 99%  Weight: 180 lb 14.4 oz (82.1 kg)  Height: 6\' 1"  (1.854 m)   Physical Exam  Constitutional: He is oriented to person, place, and time. He appears well-developed and well-nourished. No distress.  HENT:  Head: Normocephalic and atraumatic.  Eyes: Pupils are equal, round, and reactive to light. EOM are normal.  Cardiovascular: Normal rate and regular rhythm.  No murmur heard. Pulmonary/Chest: Effort normal. No respiratory distress. He has no rales.  Expiratory wheezing bilateral lung fields  Musculoskeletal: He exhibits no edema.  Neurological: He is alert and oriented to person, place, and time. Coordination and gait normal.  Finger-to-nose intact bilaterally, no asterixis  Skin: He is not diaphoretic.  Dark hypertrophic toenail on the right second toe     Assessment & Plan:   See Encounters Tab for problem based charting.  Patient discussed with Dr. Cyndie Chime

## 2018-03-21 NOTE — Progress Notes (Signed)
Medicine attending: Medical history, presenting problems, physical findings, and medications, reviewed with resident physician Dr Vishal Patel on the day of the patient visit and I concur with his evaluation and management plan. 

## 2018-03-21 NOTE — Assessment & Plan Note (Signed)
His last A1c was 5.6.  His NovoLog 70/30 was reduced to 10 units twice a day from 20 units twice a day.  He is also taking metformin 1000 mg twice daily and Invokana 300 mg daily.  He reports adherence to these medications.  He did not bring his meter and says his blood sugars have been mostly in the 100s.  Says he did have a low 60s. A/P: Repeat A1c is 5.6.  His diabetes is well controlled.  I will discontinue his insulin given his good glycemic control and high potential for hypoglycemia.  We will continue his metformin and Invokana as prescribed.  I have asked to return in 4 weeks with his meter to ensure that his glycemic control stays well controlled.  He will need a repeat A1c in 3 months.

## 2018-03-25 ENCOUNTER — Encounter: Payer: Self-pay | Admitting: Dietician

## 2018-04-01 ENCOUNTER — Encounter: Payer: Self-pay | Admitting: *Deleted

## 2018-04-02 ENCOUNTER — Other Ambulatory Visit: Payer: Self-pay | Admitting: Internal Medicine

## 2018-04-04 ENCOUNTER — Other Ambulatory Visit: Payer: Self-pay | Admitting: Internal Medicine

## 2018-04-05 ENCOUNTER — Other Ambulatory Visit: Payer: Self-pay | Admitting: Internal Medicine

## 2018-04-18 ENCOUNTER — Other Ambulatory Visit: Payer: Self-pay

## 2018-04-18 ENCOUNTER — Ambulatory Visit (INDEPENDENT_AMBULATORY_CARE_PROVIDER_SITE_OTHER): Payer: Medicaid Other | Admitting: Internal Medicine

## 2018-04-18 ENCOUNTER — Encounter: Payer: Self-pay | Admitting: *Deleted

## 2018-04-18 ENCOUNTER — Encounter (INDEPENDENT_AMBULATORY_CARE_PROVIDER_SITE_OTHER): Payer: Self-pay

## 2018-04-18 ENCOUNTER — Telehealth: Payer: Self-pay | Admitting: *Deleted

## 2018-04-18 DIAGNOSIS — Z720 Tobacco use: Secondary | ICD-10-CM | POA: Diagnosis not present

## 2018-04-18 DIAGNOSIS — Z79899 Other long term (current) drug therapy: Secondary | ICD-10-CM

## 2018-04-18 DIAGNOSIS — J4540 Moderate persistent asthma, uncomplicated: Secondary | ICD-10-CM

## 2018-04-18 DIAGNOSIS — E785 Hyperlipidemia, unspecified: Secondary | ICD-10-CM

## 2018-04-18 DIAGNOSIS — E1159 Type 2 diabetes mellitus with other circulatory complications: Secondary | ICD-10-CM

## 2018-04-18 DIAGNOSIS — Z7951 Long term (current) use of inhaled steroids: Secondary | ICD-10-CM

## 2018-04-18 DIAGNOSIS — I10 Essential (primary) hypertension: Secondary | ICD-10-CM | POA: Diagnosis not present

## 2018-04-18 DIAGNOSIS — Z7984 Long term (current) use of oral hypoglycemic drugs: Secondary | ICD-10-CM | POA: Diagnosis not present

## 2018-04-18 DIAGNOSIS — K219 Gastro-esophageal reflux disease without esophagitis: Secondary | ICD-10-CM | POA: Diagnosis not present

## 2018-04-18 DIAGNOSIS — E1169 Type 2 diabetes mellitus with other specified complication: Secondary | ICD-10-CM | POA: Diagnosis not present

## 2018-04-18 DIAGNOSIS — E118 Type 2 diabetes mellitus with unspecified complications: Secondary | ICD-10-CM

## 2018-04-18 DIAGNOSIS — Z794 Long term (current) use of insulin: Secondary | ICD-10-CM

## 2018-04-18 DIAGNOSIS — L608 Other nail disorders: Secondary | ICD-10-CM

## 2018-04-18 MED ORDER — ENALAPRIL MALEATE 10 MG PO TABS: 10.0000 mg | ORAL_TABLET | Freq: Every day | ORAL | 1 refills | Status: DC

## 2018-04-18 MED ORDER — ALBUTEROL SULFATE HFA 108 (90 BASE) MCG/ACT IN AERS: INHALATION_SPRAY | RESPIRATORY_TRACT | 6 refills | Status: DC

## 2018-04-18 MED ORDER — PANTOPRAZOLE SODIUM 40 MG PO TBEC: 40.0000 mg | DELAYED_RELEASE_TABLET | Freq: Every day | ORAL | 1 refills | Status: DC

## 2018-04-18 MED ORDER — METFORMIN HCL 1000 MG PO TABS: 1000.0000 mg | ORAL_TABLET | Freq: Two times a day (BID) | ORAL | 3 refills | Status: DC

## 2018-04-18 MED ORDER — ATORVASTATIN CALCIUM 40 MG PO TABS: 40.0000 mg | ORAL_TABLET | Freq: Every day | ORAL | 3 refills | Status: DC

## 2018-04-18 MED ORDER — BENZONATATE 100 MG PO CAPS: 100.0000 mg | ORAL_CAPSULE | Freq: Three times a day (TID) | ORAL | 0 refills | Status: DC | PRN

## 2018-04-18 NOTE — Assessment & Plan Note (Signed)
-   Currently on pantoprazole 40mg  daily for reflux - Denies any additional reflux/heart burn episodes - Will refill med as requested

## 2018-04-18 NOTE — Patient Instructions (Signed)
Dear Ruben HerterMr.Burek  Thank you for coming into the clinic for your follow up visit. We looked at your glucometer and the readings look great. We suggest you start using your Flovent (the pink inhaler) every day 2 puffs as prescribed and we gave you a sample of your albuterol inhaler so you can start using it when you feel out of breath. Thank you for visiting the clinic  -Judeth CornfieldJoshua Dusty Raczkowski

## 2018-04-18 NOTE — Progress Notes (Signed)
   CC: Asthma and T2DM  HPI: [name] is a 52 y.o. M w/ PMH of T2DM, HTN, Asthma, HLD, and tobacco use who presents for follow up management of his DM and Asthma. Please see problem based charting for status of patient's chronic medical issues  Past Medical History:  Diagnosis Date  . Asthma   . ETOH abuse   . Hypertension   . TBI (traumatic brain injury) (HCC)   . Type 2 diabetes mellitus, uncontrolled (HCC) 2010   Review of Systems: Review of Systems  Constitutional: Negative for chills, fever, malaise/fatigue and weight loss.  Respiratory: Positive for shortness of breath and wheezing. Negative for cough and sputum production.   Cardiovascular: Negative for chest pain, palpitations, orthopnea and leg swelling.  Gastrointestinal: Negative for constipation, diarrhea, nausea and vomiting.     Physical Exam: Vitals:   04/18/18 0926  BP: 129/79  Pulse: (!) 104  Temp: 98.1 F (36.7 C)  TempSrc: Oral  SpO2: 98%  Weight: 184 lb 4.8 oz (83.6 kg)  Height: 6\' 1"  (1.854 m)    Physical Exam  Constitutional: He is oriented to person, place, and time. He appears well-developed and well-nourished. No distress.  Cardiovascular: Normal rate, regular rhythm, normal heart sounds and intact distal pulses.  Respiratory: Effort normal. No respiratory distress. He has wheezes. He has no rales.  Musculoskeletal: Normal range of motion. He exhibits deformity. He exhibits no edema.  Black, thick 3rd toenail of right foot that is about to fall off   Neurological: He is alert and oriented to person, place, and time. Coordination normal.  fine point sensation of foot intact bilaterally      Assessment & Plan:   See Encounters Tab for problem based charting.  Patient seen with Dr. Criselda PeachesMullen   -Ruben Pierce, PGY1

## 2018-04-18 NOTE — Telephone Encounter (Signed)
TRIED TO CONTACT PATIENT BY  PHONE REGARDING HIS FOOT REFERRAL (2ND REFERRAL). WAS UNABLE TO GET IN CONTACT WITH THE PATIENT , ALL OF THE  PHONE NUMBERS IN HIS CHART,EITHER UNABLE TO LEAVE A MESSAGE   OR NOT TAKING ANY CALLS  AT THE MOMENT.  I'VE MAILED A LETTER TO THE PATIENT REGARDING THE REFERRAL. THE [facility] HAVE TRIED TO CONTACT THE PATIENT ALSO.

## 2018-04-18 NOTE — Assessment & Plan Note (Signed)
BP Readings from Last 3 Encounters:  04/18/18 129/79  03/21/18 132/72  03/20/18 (!) 146/86   - Currently on enalapril 10mg   - BP at goal the last 2 clinic readings  - Will refill medication as requested

## 2018-04-18 NOTE — Assessment & Plan Note (Signed)
-   Patient on Atorvastatin 40mg  daily - Denies any symptoms of muscle aches  - Will refill med as requested

## 2018-04-18 NOTE — Assessment & Plan Note (Addendum)
-   Patient with complaints of shorness of breath on exertion - He states when he walks to the bus stop, he gets out of breath and he uses his inhaler - Patient shows me his inhaler, which is actually his Flovent and not his albuterol - Pt states he had trouble refilling his albuterol from the pharmacy - Pt also is continuing to smoke about 2~3 cigarettes per day - Physical Exam: Wheezing on exam bilaterally  - Pt's asthma is most likely under-treated due to inappropriate use of his medication. - Counseled pt on using his Flovent BID everyday instead of only when he feels he need it - Gave an albuterol inhaler from clinic with instructions on using it PRN for shortness of breath - Sent refills on both Flovent and albuterol inhaler and benzonanate to his pharmacy - Patient states he is not ready to quit his smoking habits at this time

## 2018-04-18 NOTE — Assessment & Plan Note (Signed)
-   Patient states he is taking metformin 1000mg  BID and Invokana 300mg  tabs daily as instructed. No insulin use - States he did not bring his glucometer but continues to check his blood sugars - Last A1c 5.6 at 03/21/18 - Denies any episodes of hypoglycemia  - Will refill metformin as requested. C/w Invokana - Repeat A1c in 06/2018

## 2018-04-19 NOTE — Progress Notes (Signed)
Internal Medicine Clinic Attending  I saw and evaluated the patient.  I personally confirmed the key portions of the history and exam documented by Dr. Lee and I reviewed pertinent patient test results.  The assessment, diagnosis, and plan were formulated together and I agree with the documentation in the resident's note.  

## 2018-04-22 ENCOUNTER — Encounter: Payer: Self-pay | Admitting: *Deleted

## 2018-05-02 ENCOUNTER — Telehealth: Payer: Self-pay | Admitting: Internal Medicine

## 2018-05-02 ENCOUNTER — Other Ambulatory Visit: Payer: Self-pay | Admitting: Internal Medicine

## 2018-05-02 DIAGNOSIS — G4700 Insomnia, unspecified: Secondary | ICD-10-CM

## 2018-05-13 ENCOUNTER — Other Ambulatory Visit: Payer: Self-pay

## 2018-05-13 ENCOUNTER — Emergency Department (HOSPITAL_COMMUNITY)
Admission: EM | Admit: 2018-05-13 | Discharge: 2018-05-13 | Disposition: A | Discharge status: Home / Self Care | Payer: Medicaid Other | Attending: Emergency Medicine | Admitting: Emergency Medicine

## 2018-05-13 ENCOUNTER — Encounter (HOSPITAL_COMMUNITY): Payer: Self-pay | Admitting: Emergency Medicine

## 2018-05-13 ENCOUNTER — Emergency Department (HOSPITAL_COMMUNITY): Payer: Medicaid Other

## 2018-05-13 DIAGNOSIS — Z794 Long term (current) use of insulin: Secondary | ICD-10-CM | POA: Insufficient documentation

## 2018-05-13 DIAGNOSIS — E119 Type 2 diabetes mellitus without complications: Secondary | ICD-10-CM | POA: Insufficient documentation

## 2018-05-13 DIAGNOSIS — F17210 Nicotine dependence, cigarettes, uncomplicated: Secondary | ICD-10-CM | POA: Insufficient documentation

## 2018-05-13 DIAGNOSIS — J45909 Unspecified asthma, uncomplicated: Secondary | ICD-10-CM | POA: Diagnosis not present

## 2018-05-13 DIAGNOSIS — Y929 Unspecified place or not applicable: Secondary | ICD-10-CM | POA: Diagnosis not present

## 2018-05-13 DIAGNOSIS — Z79899 Other long term (current) drug therapy: Secondary | ICD-10-CM | POA: Insufficient documentation

## 2018-05-13 DIAGNOSIS — S0232XA Fracture of orbital floor, left side, initial encounter for closed fracture: Secondary | ICD-10-CM | POA: Diagnosis not present

## 2018-05-13 DIAGNOSIS — Y9355 Activity, bike riding: Secondary | ICD-10-CM | POA: Insufficient documentation

## 2018-05-13 DIAGNOSIS — Y999 Unspecified external cause status: Secondary | ICD-10-CM | POA: Diagnosis not present

## 2018-05-13 DIAGNOSIS — I10 Essential (primary) hypertension: Secondary | ICD-10-CM | POA: Diagnosis not present

## 2018-05-13 DIAGNOSIS — S0083XA Contusion of other part of head, initial encounter: Secondary | ICD-10-CM | POA: Diagnosis present

## 2018-05-13 MED ORDER — CYCLOBENZAPRINE HCL 10 MG PO TABS: 10.0000 mg | ORAL_TABLET | Freq: Every day | ORAL | 0 refills | Status: DC

## 2018-05-13 MED ORDER — CYCLOBENZAPRINE HCL 10 MG PO TABS
10.0000 mg | ORAL_TABLET | Freq: Once | ORAL | Status: AC
  Administered 2018-05-13: 10 mg via ORAL
  Filled 2018-05-13: qty 1
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
## 2018-05-13 NOTE — Discharge Instructions (Signed)
Use the ibuprofen 2 pills every 6 hours for the pain.  Use the muscle relaxer at night before you go to bed and use saline spray in the nose to help with the bleeding.

## 2018-05-13 NOTE — ED Notes (Signed)
ED Provider at bedside. 

## 2018-05-17 ENCOUNTER — Ambulatory Visit: Payer: Medicaid Other | Admitting: Podiatry

## 2018-05-17 NOTE — Addendum Note (Signed)
Addended by: Dorie RankPOWERS, Ruey Storer E on: 05/17/2018 12:05 PM   Modules accepted: Orders

## 2018-05-30 ENCOUNTER — Other Ambulatory Visit: Payer: Self-pay | Admitting: Internal Medicine

## 2018-05-30 NOTE — Telephone Encounter (Signed)
Not on current med list; ?discontinued.

## 2018-06-05 ENCOUNTER — Other Ambulatory Visit: Payer: Self-pay | Admitting: Internal Medicine

## 2018-06-13 ENCOUNTER — Ambulatory Visit: Payer: Medicaid Other | Admitting: Podiatry

## 2018-08-20 ENCOUNTER — Other Ambulatory Visit: Payer: Self-pay | Admitting: Internal Medicine

## 2018-08-20 DIAGNOSIS — G4700 Insomnia, unspecified: Secondary | ICD-10-CM

## 2018-08-20 NOTE — Telephone Encounter (Signed)
Taking for insomnia. Last seen in 11/2017 and reports improvement of symptoms while on medication. Will refill this medication at this time. Follow-up appointment is set with me on 10/09/2018.

## 2018-09-30 ENCOUNTER — Other Ambulatory Visit: Payer: Self-pay | Admitting: Internal Medicine

## 2018-09-30 DIAGNOSIS — E118 Type 2 diabetes mellitus with unspecified complications: Secondary | ICD-10-CM

## 2018-09-30 DIAGNOSIS — Z794 Long term (current) use of insulin: Principal | ICD-10-CM

## 2018-10-09 ENCOUNTER — Other Ambulatory Visit: Payer: Self-pay

## 2018-10-09 ENCOUNTER — Encounter (INDEPENDENT_AMBULATORY_CARE_PROVIDER_SITE_OTHER): Payer: Self-pay

## 2018-10-09 ENCOUNTER — Encounter: Payer: Self-pay | Admitting: Internal Medicine

## 2018-10-09 ENCOUNTER — Ambulatory Visit: Payer: Medicaid Other | Admitting: Internal Medicine

## 2018-10-09 VITALS — BP 157/96 | HR 94 | Temp 98.2°F | Ht 73.0 in | Wt 188.0 lb

## 2018-10-09 DIAGNOSIS — I10 Essential (primary) hypertension: Secondary | ICD-10-CM

## 2018-10-09 DIAGNOSIS — Z7984 Long term (current) use of oral hypoglycemic drugs: Secondary | ICD-10-CM | POA: Diagnosis not present

## 2018-10-09 DIAGNOSIS — E118 Type 2 diabetes mellitus with unspecified complications: Secondary | ICD-10-CM

## 2018-10-09 DIAGNOSIS — Z720 Tobacco use: Secondary | ICD-10-CM

## 2018-10-09 DIAGNOSIS — Z7289 Other problems related to lifestyle: Secondary | ICD-10-CM

## 2018-10-09 DIAGNOSIS — E1159 Type 2 diabetes mellitus with other circulatory complications: Secondary | ICD-10-CM | POA: Diagnosis present

## 2018-10-09 DIAGNOSIS — Z79899 Other long term (current) drug therapy: Secondary | ICD-10-CM | POA: Diagnosis not present

## 2018-10-09 DIAGNOSIS — F17210 Nicotine dependence, cigarettes, uncomplicated: Secondary | ICD-10-CM | POA: Diagnosis not present

## 2018-10-09 DIAGNOSIS — Z789 Other specified health status: Secondary | ICD-10-CM

## 2018-10-09 DIAGNOSIS — K219 Gastro-esophageal reflux disease without esophagitis: Secondary | ICD-10-CM

## 2018-10-09 DIAGNOSIS — L602 Onychogryphosis: Secondary | ICD-10-CM

## 2018-10-09 DIAGNOSIS — Z794 Long term (current) use of insulin: Principal | ICD-10-CM

## 2018-10-09 DIAGNOSIS — E119 Type 2 diabetes mellitus without complications: Secondary | ICD-10-CM

## 2018-10-09 DIAGNOSIS — J4540 Moderate persistent asthma, uncomplicated: Secondary | ICD-10-CM

## 2018-10-09 DIAGNOSIS — Z0000 Encounter for general adult medical examination without abnormal findings: Secondary | ICD-10-CM

## 2018-10-09 LAB — POCT GLYCOSYLATED HEMOGLOBIN (HGB A1C): HEMOGLOBIN A1C: 6.2 % — AB (ref 4.0–5.6)

## 2018-10-09 LAB — GLUCOSE, CAPILLARY: Glucose-Capillary: 110 mg/dL — ABNORMAL HIGH (ref 70–99)

## 2018-10-09 MED ORDER — FLUTICASONE PROPIONATE HFA 110 MCG/ACT IN AERO: 1.0000 | INHALATION_SPRAY | Freq: Two times a day (BID) | RESPIRATORY_TRACT | 11 refills | Status: DC

## 2018-10-09 MED ORDER — NALTREXONE HCL 50 MG PO TABS: 50.0000 mg | ORAL_TABLET | Freq: Every day | ORAL | 3 refills | Status: DC

## 2018-10-09 MED ORDER — ALBUTEROL SULFATE HFA 108 (90 BASE) MCG/ACT IN AERS: INHALATION_SPRAY | RESPIRATORY_TRACT | 6 refills | Status: DC

## 2018-10-09 MED ORDER — ENALAPRIL MALEATE 10 MG PO TABS: 10.0000 mg | ORAL_TABLET | Freq: Every day | ORAL | 1 refills | Status: DC

## 2018-10-09 MED ORDER — PANTOPRAZOLE SODIUM 40 MG PO TBEC: 40.0000 mg | DELAYED_RELEASE_TABLET | Freq: Every day | ORAL | 1 refills | Status: DC

## 2018-10-09 NOTE — Progress Notes (Signed)
   CC: follow-up of HTN and diabetes  HPI:  [name] is a 53 y.o. male with asthma, alcohol use disorder, tobacco use disorder, hypertension, type 2 diabetes who presents for follow-up of HTN and diabetes.  Type 2 diabetes: [name] is taking metformin 100 mg twice daily and Invokana 300 mg daily as instructed.  His A1c today is 6.2.  Hypertension: He ran out of his enalapril 10 mg daily 1 month ago.  His blood pressure today is 157/96.  Moderate persistent asthma: He ran out of Flovent and albuterol inhalers. He denies any SOB but has noted mild wheezing.   Alcohol use Disorder: He began drinking heavily 8 years ago when he got out of prison. He drinks 15 beers per day every day. He is interested in quitting.   Tobacco use disorder:  He continue to smoke 1/2 pack per day. He is interested in quitting but states that it is very difficult because his sister-in-law smokes tobacco in the house.   Hypertrophic Toenail He reports pain at the second toe for the last several months. He denies trauma and is able to bear weight on this foot. He is requesting a referral to podiatry.   Past Medical History:  Diagnosis Date  . Asthma   . ETOH abuse   . Hypertension   . TBI (traumatic brain injury) (HCC)   . Type 2 diabetes mellitus, uncontrolled (HCC) 2010   Review of Systems:  Review of Systems  Constitutional: Negative for chills and fever.  HENT: Negative for congestion.   Respiratory: Positive for wheezing. Negative for cough, sputum production and shortness of breath.   Cardiovascular: Negative for chest pain and palpitations.    Physical Exam:  Vitals:   10/09/18 1441  BP: (!) 157/96  Pulse: 94  Temp: 98.2 F (36.8 C)  TempSrc: Oral  SpO2: 100%  Weight: 188 lb (85.3 kg)  Height: 6\' 1"  (1.854 m)   Physical Exam Vitals signs and nursing note reviewed.  Constitutional:      Appearance: Normal appearance.  Cardiovascular:     Rate and Rhythm: Normal rate and  regular rhythm.  Pulmonary:     Effort: Pulmonary effort is normal.     Comments: Diffuse wheezing auscultated throughout all lung fields. No crackles or other abnormal lung sounds appreciated. Abdominal:     General: Abdomen is flat. Bowel sounds are normal. There is no distension.     Palpations: Abdomen is soft.     Tenderness: There is no abdominal tenderness.     Comments: No hepatomegaly.  Musculoskeletal:     Comments: Hyperkeratotic toenails of all toes on right and left feet. The second right toe overlaps the third right toe. No erythema or warmth to the area.   Neurological:     Mental Status: He is alert.     Assessment & Plan:   See Encounters Tab for problem based charting.  Patient seen with Dr. Oswaldo DoneVincent

## 2018-10-10 ENCOUNTER — Other Ambulatory Visit (INDEPENDENT_AMBULATORY_CARE_PROVIDER_SITE_OTHER): Payer: Medicaid Other

## 2018-10-10 DIAGNOSIS — Z7289 Other problems related to lifestyle: Secondary | ICD-10-CM | POA: Diagnosis not present

## 2018-10-10 DIAGNOSIS — Z0000 Encounter for general adult medical examination without abnormal findings: Secondary | ICD-10-CM

## 2018-10-10 NOTE — Progress Notes (Signed)
Called pt - stated he will come back today after work to have labs done.

## 2018-10-11 LAB — CMP14 + ANION GAP
ALT: 25 IU/L (ref 0–44)
AST: 28 IU/L (ref 0–40)
Albumin/Globulin Ratio: 2.4 — ABNORMAL HIGH (ref 1.2–2.2)
Albumin: 5 g/dL (ref 3.5–5.5)
Alkaline Phosphatase: 110 IU/L (ref 39–117)
Anion Gap: 21 mmol/L — ABNORMAL HIGH (ref 10.0–18.0)
BUN/Creatinine Ratio: 11 (ref 9–20)
BUN: 14 mg/dL (ref 6–24)
Bilirubin Total: 0.3 mg/dL (ref 0.0–1.2)
CO2: 19 mmol/L — ABNORMAL LOW (ref 20–29)
Calcium: 10.2 mg/dL (ref 8.7–10.2)
Chloride: 104 mmol/L (ref 96–106)
Creatinine, Ser: 1.24 mg/dL (ref 0.76–1.27)
GFR calc Af Amer: 77 mL/min/1.73
GFR calc non Af Amer: 67 mL/min/1.73
Globulin, Total: 2.1 g/dL (ref 1.5–4.5)
Glucose: 134 mg/dL — ABNORMAL HIGH (ref 65–99)
Potassium: 4.7 mmol/L (ref 3.5–5.2)
Sodium: 144 mmol/L (ref 134–144)
Total Protein: 7.1 g/dL (ref 6.0–8.5)

## 2018-10-11 LAB — HEPATITIS B SURFACE ANTIGEN: Hepatitis B Surface Ag: NEGATIVE

## 2018-10-11 LAB — HEPATITIS B CORE ANTIBODY, TOTAL: Hep B Core Total Ab: NEGATIVE

## 2018-10-11 LAB — HEPATITIS B SURFACE ANTIBODY,QUALITATIVE: Hep B Surface Ab, Qual: NONREACTIVE

## 2018-10-11 LAB — HEPATITIS C ANTIBODY: Hep C Virus Ab: <0.1 s/co ratio (ref 0.0–0.9)

## 2018-10-11 LAB — HIV ANTIBODY (ROUTINE TESTING W REFLEX): HIV Screen 4th Generation wRfx: NONREACTIVE

## 2018-10-13 NOTE — Assessment & Plan Note (Signed)
Assessment/plan: Smoke 1/2 ppd. Would like to quit. I counseled him on smoking cessation. His self-made goal is to stop smoking all-together in 3 months. I offered smoking cessation products such as patches and gum. He does not believe this will help but will attempt to quit on his own.

## 2018-10-13 NOTE — Assessment & Plan Note (Signed)
Assessment: Drinking 15 beers per day. He would like to quit.  Plan: 1. Start naltrexone 50 mg QD 2. Follow-up in 1 month to assess progress.

## 2018-10-13 NOTE — Assessment & Plan Note (Signed)
GERD stable. Refilled pantoprazole today.

## 2018-10-13 NOTE — Assessment & Plan Note (Signed)
Assessment: DM well-controlled on metformin and invokana.   Plan: 1. Continue metformin 100 mg twice daily 2. Continue Invokana 300 mg daily

## 2018-10-13 NOTE — Assessment & Plan Note (Signed)
Assessment: Hyperkeratotic second right toe with pain. No signs of infection or bony abnormalities. He was previously referred to podiatry in 03/2018 but stated that he was never called.  Plan: 1. Re-refer to podiatry today

## 2018-10-13 NOTE — Assessment & Plan Note (Signed)
Assessment:  He ran out of Flovent and albuterol inhalers 1 month ago. He has wheezing on exam likely due to under-treatment of asthma.  Plan: 1. Refilled Flovent 2. Refilled albuterol

## 2018-10-13 NOTE — Assessment & Plan Note (Signed)
Assessment: Blood pressure elevated today. He ran out of his enalapril 1 month ago.  Plan: 1. Refill enalapril 10 mg daily 2. Recheck BP at 1 month follow-up visit.

## 2018-10-13 NOTE — Assessment & Plan Note (Signed)
Screened for hepatitis C/B and HIV. Hep C negative. HIV negative. Hep B surface Ab, Hep B surface antigen, and Hep B core Ab all negative suggesting that he does not have immunity to hepatitis nor has been infected.  I will discuss with him the possibility of hepatitis B vaccination at next clinic visit.

## 2018-10-14 NOTE — Progress Notes (Signed)
Internal Medicine Clinic Attending  Case discussed with Dr. Prince at the time of the visit.  We reviewed the resident's history and exam and pertinent patient test results.  I agree with the assessment, diagnosis, and plan of care documented in the resident's note.   

## 2018-10-21 ENCOUNTER — Other Ambulatory Visit: Payer: Self-pay | Admitting: Internal Medicine

## 2018-10-21 DIAGNOSIS — K219 Gastro-esophageal reflux disease without esophagitis: Secondary | ICD-10-CM

## 2018-10-22 ENCOUNTER — Encounter: Payer: Self-pay | Admitting: *Deleted

## 2018-11-12 ENCOUNTER — Other Ambulatory Visit: Payer: Self-pay | Admitting: Internal Medicine

## 2018-11-12 DIAGNOSIS — I152 Hypertension secondary to endocrine disorders: Secondary | ICD-10-CM

## 2018-11-12 DIAGNOSIS — E1159 Type 2 diabetes mellitus with other circulatory complications: Secondary | ICD-10-CM

## 2018-11-12 DIAGNOSIS — I10 Essential (primary) hypertension: Principal | ICD-10-CM

## 2018-11-13 ENCOUNTER — Encounter: Payer: Self-pay | Admitting: Internal Medicine

## 2018-11-13 ENCOUNTER — Ambulatory Visit (INDEPENDENT_AMBULATORY_CARE_PROVIDER_SITE_OTHER): Payer: Medicaid Other | Admitting: Internal Medicine

## 2018-11-13 ENCOUNTER — Other Ambulatory Visit: Payer: Self-pay

## 2018-11-13 VITALS — BP 132/73 | HR 97 | Temp 98.1°F | Ht 73.0 in | Wt 193.3 lb

## 2018-11-13 DIAGNOSIS — Z23 Encounter for immunization: Secondary | ICD-10-CM | POA: Diagnosis not present

## 2018-11-13 DIAGNOSIS — Z7289 Other problems related to lifestyle: Secondary | ICD-10-CM

## 2018-11-13 DIAGNOSIS — Z789 Other specified health status: Secondary | ICD-10-CM

## 2018-11-13 DIAGNOSIS — K219 Gastro-esophageal reflux disease without esophagitis: Secondary | ICD-10-CM

## 2018-11-13 DIAGNOSIS — E118 Type 2 diabetes mellitus with unspecified complications: Secondary | ICD-10-CM

## 2018-11-13 DIAGNOSIS — F1099 Alcohol use, unspecified with unspecified alcohol-induced disorder: Secondary | ICD-10-CM | POA: Diagnosis not present

## 2018-11-13 DIAGNOSIS — E1159 Type 2 diabetes mellitus with other circulatory complications: Secondary | ICD-10-CM | POA: Diagnosis not present

## 2018-11-13 DIAGNOSIS — J4540 Moderate persistent asthma, uncomplicated: Secondary | ICD-10-CM | POA: Diagnosis not present

## 2018-11-13 DIAGNOSIS — F17210 Nicotine dependence, cigarettes, uncomplicated: Secondary | ICD-10-CM

## 2018-11-13 DIAGNOSIS — Z720 Tobacco use: Secondary | ICD-10-CM

## 2018-11-13 DIAGNOSIS — I10 Essential (primary) hypertension: Secondary | ICD-10-CM | POA: Diagnosis not present

## 2018-11-13 DIAGNOSIS — Z794 Long term (current) use of insulin: Secondary | ICD-10-CM

## 2018-11-13 DIAGNOSIS — Z0000 Encounter for general adult medical examination without abnormal findings: Secondary | ICD-10-CM

## 2018-11-13 LAB — GLUCOSE, CAPILLARY: Glucose-Capillary: 162 mg/dL — ABNORMAL HIGH (ref 70–99)

## 2018-11-13 MED ORDER — ENALAPRIL MALEATE 10 MG PO TABS: 10.0000 mg | ORAL_TABLET | Freq: Every day | ORAL | 3 refills | Status: DC

## 2018-11-13 MED ORDER — NICOTINE POLACRILEX 4 MG MT GUM: 4.0000 mg | CHEWING_GUM | OROMUCOSAL | 0 refills | Status: DC | PRN

## 2018-11-13 MED ORDER — ALBUTEROL SULFATE HFA 108 (90 BASE) MCG/ACT IN AERS: INHALATION_SPRAY | RESPIRATORY_TRACT | 6 refills | Status: DC

## 2018-11-13 MED ORDER — FLUTICASONE PROPIONATE HFA 110 MCG/ACT IN AERO: 1.0000 | INHALATION_SPRAY | Freq: Two times a day (BID) | RESPIRATORY_TRACT | 11 refills | Status: DC

## 2018-11-13 MED ORDER — NALTREXONE HCL 50 MG PO TABS: 50.0000 mg | ORAL_TABLET | Freq: Every day | ORAL | 3 refills | Status: DC

## 2018-11-13 MED ORDER — CANAGLIFLOZIN 300 MG PO TABS: 300.0000 mg | ORAL_TABLET | Freq: Every day | ORAL | 3 refills | Status: DC

## 2018-11-13 MED ORDER — PANTOPRAZOLE SODIUM 40 MG PO TBEC: 40.0000 mg | DELAYED_RELEASE_TABLET | Freq: Every day | ORAL | 3 refills | Status: DC

## 2018-11-13 NOTE — Assessment & Plan Note (Signed)
Assessment: He was drinking 15 beers per day up until 4 days ago when he quit abruptly.  He has not had any signs of withdrawal including tremors, agitation, and tachycardia.  He is overall feeling much better and is ready to remain abstinent.  Unfortunately his naltrexone 50 mg daily prescription was sent to the wrong pharmacy last month.  We will send in this medication to his correct pharmacy today to help with any cravings that he may have.  Plan: 1.  Start naltrexone 50 mg daily.

## 2018-11-13 NOTE — Assessment & Plan Note (Signed)
Received Heplisav-B vaccine today. Will need repeat vaccination in 1 month.

## 2018-11-13 NOTE — Assessment & Plan Note (Signed)
Assessment: Blood pressure improved today to 132/73 after restarting enalapril 10 mg daily.  He is still above goal today but has been at goal several visits in the past while on this medication.  We will recheck his blood pressure at next clinic visit in 1 month.  If his blood pressure continues to be above goal (SBP >130) will plan on increases in his enalapril.  Plan: 1.  Continue enalapril 10 mg daily

## 2018-11-13 NOTE — Progress Notes (Signed)
Internal Medicine Clinic Attending  Case discussed with Dr. Prince at the time of the visit.  We reviewed the resident's history and exam and pertinent patient test results.  I agree with the assessment, diagnosis, and plan of care documented in the resident's note.   

## 2018-11-13 NOTE — Assessment & Plan Note (Signed)
Stable.  He has been taking Flovent daily but has been out of his albuterol for several weeks.  Will resend Flovent and albuterol refill today.

## 2018-11-13 NOTE — Progress Notes (Signed)
   CC: hypertension, alcohol use disorder, and tobacco use disorder  HPI:  [name] is a 53 y.o. male with asthma, alcohol use disorder, tobacco use disorder, hypertension, and type 2 diabetes who resents for follow-up of hypertension, alcohol use disorder, and tobacco use disorder.  Hypertension: Blood pressure was elevated at last clinic visit 1 month ago.  At that time he had run out of his enalapril for several weeks.  I refilled this antihypertensive.  He currently reports good compliance with his medication.  Blood pressure today is 132/73.  Tobacco use disorder: At our last clinic visit 1 month ago he was smoking half a pack per day.  He made a goal to completely stop smoking altogether in 3 months.  He has continued to smoke half a pack per day and states that it is very difficult to quit.  He is willing to try again.  Alcohol use disorder: 1 month ago he was drinking 15 beers per day and wish to quit.  I started naltrexone 50 mg daily but unfortunately he did not take this medication because it was sent to the wrong pharmacy.  Despite not taking this medication he did quit completely 4 days ago.  He does not have any signs of withdrawal including tremors, agitation, and tachycardia.  Lack of immunity to hepatitis B: Serologic studies showed no immunity to hepatitis B.    Medication refill: He is requesting refills on canagliflozin, albuterol, Flovent, and pantoprazole.  Past Medical History:  Diagnosis Date  . Asthma   . ETOH abuse   . Hypertension   . TBI (traumatic brain injury) (HCC)   . Type 2 diabetes mellitus, uncontrolled (HCC) 2010   Review of Systems:   Review of Systems  Constitutional: Negative for chills, diaphoresis, fever and weight loss.  Respiratory: Negative for cough, sputum production and shortness of breath.   Cardiovascular: Negative for chest pain, palpitations, orthopnea and leg swelling.  Genitourinary: Negative for dysuria, frequency,  hematuria and urgency.  Neurological: Negative for dizziness.       Denies agitation and tremors.  All other systems reviewed and are negative.   Physical Exam:  Vitals:   11/13/18 1453  BP: 132/73  Pulse: 97  Temp: 98.1 F (36.7 C)  TempSrc: Oral  SpO2: 100%  Weight: 193 lb 4.8 oz (87.7 kg)  Height: 6\' 1"  (1.854 m)   Physical Exam Vitals signs reviewed.  Constitutional:      Appearance: Normal appearance.  HENT:     Head: Normocephalic and atraumatic.     Nose: No congestion or rhinorrhea.  Cardiovascular:     Rate and Rhythm: Normal rate and regular rhythm.     Pulses: Normal pulses.     Heart sounds: Normal heart sounds.  Pulmonary:     Effort: Pulmonary effort is normal. No respiratory distress.     Breath sounds: Normal breath sounds.  Musculoskeletal:        General: No swelling or tenderness.  Skin:    General: Skin is warm and dry.  Neurological:     Mental Status: He is alert and oriented to person, place, and time. Mental status is at baseline.  Psychiatric:        Mood and Affect: Mood normal.        Behavior: Behavior normal.     Assessment & Plan:   See Encounters Tab for problem based charting.  Patient discussed with Dr. Rogelia Boga

## 2018-11-13 NOTE — Assessment & Plan Note (Signed)
Assessment/plan: He continues to smoke 10 cigarettes/day.  He is willing to try and quit and would like to start nicotine gum as needed for smoking cessation.  Sent a prescription for nicotine 4 mg gum as needed.

## 2018-11-13 NOTE — Assessment & Plan Note (Signed)
GERD stable.  Refill last month sent to wrong pharmacy.  Refill pantoprazole today.

## 2018-11-20 NOTE — Addendum Note (Signed)
Addended by: Neomia Dear on: 11/20/2018 09:11 PM   Modules accepted: Orders

## 2018-12-04 ENCOUNTER — Ambulatory Visit: Payer: Self-pay

## 2018-12-19 ENCOUNTER — Ambulatory Visit: Payer: Medicaid Other | Admitting: Internal Medicine

## 2018-12-19 ENCOUNTER — Encounter: Payer: Self-pay | Admitting: Internal Medicine

## 2018-12-19 ENCOUNTER — Other Ambulatory Visit: Payer: Self-pay

## 2018-12-19 ENCOUNTER — Other Ambulatory Visit: Payer: Self-pay | Admitting: Pharmacist

## 2018-12-19 VITALS — BP 143/80 | HR 98 | Temp 98.1°F | Ht 73.0 in | Wt 183.5 lb

## 2018-12-19 DIAGNOSIS — Z0000 Encounter for general adult medical examination without abnormal findings: Secondary | ICD-10-CM

## 2018-12-19 DIAGNOSIS — Z79899 Other long term (current) drug therapy: Secondary | ICD-10-CM | POA: Diagnosis not present

## 2018-12-19 DIAGNOSIS — I10 Essential (primary) hypertension: Secondary | ICD-10-CM | POA: Diagnosis not present

## 2018-12-19 DIAGNOSIS — J4540 Moderate persistent asthma, uncomplicated: Secondary | ICD-10-CM

## 2018-12-19 DIAGNOSIS — Z87820 Personal history of traumatic brain injury: Secondary | ICD-10-CM | POA: Diagnosis not present

## 2018-12-19 DIAGNOSIS — W500XXA Accidental hit or strike by another person, initial encounter: Secondary | ICD-10-CM

## 2018-12-19 DIAGNOSIS — Z23 Encounter for immunization: Secondary | ICD-10-CM | POA: Diagnosis not present

## 2018-12-19 DIAGNOSIS — F17210 Nicotine dependence, cigarettes, uncomplicated: Secondary | ICD-10-CM

## 2018-12-19 DIAGNOSIS — F1020 Alcohol dependence, uncomplicated: Secondary | ICD-10-CM

## 2018-12-19 DIAGNOSIS — E119 Type 2 diabetes mellitus without complications: Secondary | ICD-10-CM | POA: Diagnosis not present

## 2018-12-19 DIAGNOSIS — I951 Orthostatic hypotension: Secondary | ICD-10-CM

## 2018-12-19 DIAGNOSIS — S6992XA Unspecified injury of left wrist, hand and finger(s), initial encounter: Secondary | ICD-10-CM | POA: Insufficient documentation

## 2018-12-19 MED ORDER — INFLUENZA VAC SPLIT QUAD 0.5 ML IM SUSY: 0.5000 mL | PREFILLED_SYRINGE | Freq: Once | INTRAMUSCULAR | 0 refills | Status: DC

## 2018-12-19 NOTE — Assessment & Plan Note (Signed)
The patient states that he feels short of breath occasionally. He had wheezing throughout his lung fields. He is currently being prescribed flonase and albuterol for presumed asthma. The patient is a current everyday smoker (1/2 ppd) which also causes concern for COPD.   -Ordered pfts to evaluate

## 2018-12-19 NOTE — Patient Instructions (Signed)
It was a pleasure to see you today [name]. Please make the following changes:  -Please make sure you drink plenty of water. 1.5-2L of water daily  -Please get pulmonary function tests done  -Please make sure you isolate yourself and stay 60ft distance from everyone -Please continue taking naltrexone  -Please cut down on smoking   If you have any questions or concerns, please call our clinic at [phone] between 9am-5pm and after hours call [phone] and ask for the internal medicine resident on call. If you feel you are having a medical emergency please call 911.   Thank you, we look forward to help you remain healthy!  Ruben Courier, MD Internal Medicine PGY2

## 2018-12-19 NOTE — Assessment & Plan Note (Signed)
States that he has been feeling dizzy especially noted when he is standing up from seated position. Patient states that he drinks 1 gallon of water every 3 days.  Assessment and plan  His dizziness is likely secondary to dehydration. Orthostatic vital signs were positive. The patient was educated on drinking plenty of water.
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
## 2018-12-19 NOTE — Assessment & Plan Note (Signed)
The patient was recently started on naltrexone 50mg  qd on 11/13/18 and was scheduled today for a follow up visit. The patient was previously drinking up to 15 beers daily.   The patient has not developed any side effects of depression, nausea,  or fatigue. Has been having occasional headaches.   Assessment and plan  The patient has tolerated naltrexone well without any side effects and has decreased his alcohol use. He is now drinking 2-3 beers daily. I counted patient's naltrexone pills and he has 12 pills left in his bottle for a 30 day supply. Therefore, I suspect that the patient has not been adherent to the daily medication regimen.   -Counseled patient on importance of adhering to naltrexone regimen.  -Patient got second dose of hepislav B today 12/19/18 and is due for next dose in 4-6 months

## 2018-12-19 NOTE — Progress Notes (Signed)
   CC: Alcohol use disorder follow up  HPI:  [name] is a 53 y.o. with TBI, alcohol use disorder, hypertension, diabetes mellitus type 2 who presents for follow up of aud. Please see problem based charting for evaluation, assessment, and plan.  Past Medical History:  Diagnosis Date  . Asthma   . ETOH abuse   . Hypertension   . TBI (traumatic brain injury) (HCC)   . Type 2 diabetes mellitus, uncontrolled (HCC) 2010   Review of Systems:    Review of Systems  Constitutional: Negative for chills, fever and malaise/fatigue.  Respiratory: Positive for shortness of breath and wheezing. Negative for cough.   Gastrointestinal: Negative for abdominal pain, nausea and vomiting.  Musculoskeletal: Negative for falls, joint pain and myalgias.  Neurological: Positive for dizziness and headaches.  Psychiatric/Behavioral: Negative for depression and suicidal ideas.   Physical Exam:  Vitals:   12/19/18 1025  BP: (!) 143/80  Pulse: 98  Temp: 98.1 F (36.7 C)  TempSrc: Oral  SpO2: 99%  Weight: 183 lb 8 oz (83.2 kg)  Height: 6\' 1"  (1.854 m)   Physical Exam  Constitutional: He appears well-developed and well-nourished. No distress.  HENT:  Head: Normocephalic and atraumatic.  Eyes: Conjunctivae are normal.  Cardiovascular: Normal rate, regular rhythm and normal heart sounds.  Respiratory: Effort normal. He has wheezes (throughout lung fields).  GI: Soft. Bowel sounds are normal. He exhibits no distension. There is no abdominal tenderness.  Musculoskeletal:        General: No edema.     Comments: Dorsal aspect of left hand with mild swelling and protrusion at 3rd metatarsal region. There is no erythema or tenderness to palpation. 5/5 strength in bilateral upper extremity. No sensory deficits   Neurological: He is alert.  Skin: He is not diaphoretic. No erythema.  Psychiatric: He has a normal mood and affect. His behavior is normal. Judgment and thought content normal.    Assessment & Plan:   See Encounters Tab for problem based charting.  Patient discussed with Dr. Criselda Peaches

## 2018-12-19 NOTE — Assessment & Plan Note (Signed)
Patient was given 2nd dose of hep b and influenza vaccine during thsi encounter

## 2018-12-23 NOTE — Progress Notes (Signed)
Internal Medicine Clinic Attending  Case discussed with Dr. Chundi at the time of the visit.  We reviewed the resident's history and exam and pertinent patient test results.  I agree with the assessment, diagnosis, and plan of care documented in the resident's note. 

## 2019-01-18 ENCOUNTER — Other Ambulatory Visit: Payer: Self-pay | Admitting: Internal Medicine

## 2019-01-18 DIAGNOSIS — E1169 Type 2 diabetes mellitus with other specified complication: Secondary | ICD-10-CM

## 2019-01-18 DIAGNOSIS — E785 Hyperlipidemia, unspecified: Principal | ICD-10-CM

## 2019-01-20 NOTE — Telephone Encounter (Signed)
Dr Criss Alvine,  Atorvastatin prescription was filled on 01/18/2019. Any refills authorized will be placed on file.Ruben Spittle Cassady4/20/202011:47 AM

## 2019-02-01 ENCOUNTER — Other Ambulatory Visit: Payer: Self-pay | Admitting: Internal Medicine

## 2019-02-01 DIAGNOSIS — Z7289 Other problems related to lifestyle: Secondary | ICD-10-CM

## 2019-02-01 DIAGNOSIS — Z789 Other specified health status: Secondary | ICD-10-CM
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
## 2019-02-25 ENCOUNTER — Other Ambulatory Visit: Payer: Self-pay | Admitting: Internal Medicine

## 2019-02-25 DIAGNOSIS — Z789 Other specified health status: Secondary | ICD-10-CM

## 2019-02-25 DIAGNOSIS — Z7289 Other problems related to lifestyle: Secondary | ICD-10-CM

## 2019-02-25 DIAGNOSIS — E118 Type 2 diabetes mellitus with unspecified complications: Secondary | ICD-10-CM

## 2019-02-25 DIAGNOSIS — Z794 Long term (current) use of insulin: Secondary | ICD-10-CM

## 2019-03-10 ENCOUNTER — Other Ambulatory Visit: Payer: Self-pay | Admitting: Internal Medicine

## 2019-03-10 DIAGNOSIS — G4700 Insomnia, unspecified: Secondary | ICD-10-CM

## 2019-03-23 ENCOUNTER — Encounter: Payer: Self-pay | Admitting: *Deleted

## 2019-03-30 ENCOUNTER — Encounter (HOSPITAL_COMMUNITY): Payer: Self-pay | Admitting: Emergency Medicine

## 2019-03-30 ENCOUNTER — Other Ambulatory Visit: Payer: Self-pay

## 2019-03-30 ENCOUNTER — Emergency Department (HOSPITAL_COMMUNITY)
Admission: EM | Admit: 2019-03-30 | Discharge: 2019-03-30 | Disposition: A | Discharge status: Home / Self Care | Payer: Medicaid Other | Attending: Emergency Medicine | Admitting: Emergency Medicine

## 2019-03-30 DIAGNOSIS — S80811A Abrasion, right lower leg, initial encounter: Secondary | ICD-10-CM | POA: Insufficient documentation

## 2019-03-30 DIAGNOSIS — S40252A Superficial foreign body of left shoulder, initial encounter: Secondary | ICD-10-CM | POA: Insufficient documentation

## 2019-03-30 DIAGNOSIS — Y9389 Activity, other specified: Secondary | ICD-10-CM | POA: Diagnosis not present

## 2019-03-30 DIAGNOSIS — J45909 Unspecified asthma, uncomplicated: Secondary | ICD-10-CM | POA: Diagnosis not present

## 2019-03-30 DIAGNOSIS — I10 Essential (primary) hypertension: Secondary | ICD-10-CM | POA: Insufficient documentation

## 2019-03-30 DIAGNOSIS — F17210 Nicotine dependence, cigarettes, uncomplicated: Secondary | ICD-10-CM | POA: Diagnosis not present

## 2019-03-30 DIAGNOSIS — T07XXXA Unspecified multiple injuries, initial encounter: Secondary | ICD-10-CM

## 2019-03-30 DIAGNOSIS — E119 Type 2 diabetes mellitus without complications: Secondary | ICD-10-CM | POA: Insufficient documentation

## 2019-03-30 DIAGNOSIS — Z1831 Retained animal quills or spines: Secondary | ICD-10-CM | POA: Diagnosis not present

## 2019-03-30 DIAGNOSIS — Z7984 Long term (current) use of oral hypoglycemic drugs: Secondary | ICD-10-CM | POA: Insufficient documentation

## 2019-03-30 DIAGNOSIS — Y929 Unspecified place or not applicable: Secondary | ICD-10-CM | POA: Diagnosis not present

## 2019-03-30 DIAGNOSIS — F10920 Alcohol use, unspecified with intoxication, uncomplicated: Secondary | ICD-10-CM | POA: Diagnosis not present

## 2019-03-30 DIAGNOSIS — Y999 Unspecified external cause status: Secondary | ICD-10-CM | POA: Insufficient documentation

## 2019-03-30 DIAGNOSIS — S40212A Abrasion of left shoulder, initial encounter: Secondary | ICD-10-CM | POA: Diagnosis not present

## 2019-03-30 DIAGNOSIS — Z79899 Other long term (current) drug therapy: Secondary | ICD-10-CM | POA: Diagnosis not present

## 2019-03-30 DIAGNOSIS — S20452A Superficial foreign body of left back wall of thorax, initial encounter: Secondary | ICD-10-CM | POA: Diagnosis not present

## 2019-03-30 DIAGNOSIS — Y909 Presence of alcohol in blood, level not specified: Secondary | ICD-10-CM | POA: Diagnosis not present

## 2019-03-30 DIAGNOSIS — Z23 Encounter for immunization: Secondary | ICD-10-CM | POA: Insufficient documentation

## 2019-03-30 DIAGNOSIS — S8991XA Unspecified injury of right lower leg, initial encounter: Secondary | ICD-10-CM | POA: Diagnosis present

## 2019-03-30 LAB — CBG MONITORING, ED: Glucose-Capillary: 105 mg/dL — ABNORMAL HIGH (ref 70–99)

## 2019-03-30 MED ORDER — TETANUS-DIPHTH-ACELL PERTUSSIS 5-2.5-18.5 LF-MCG/0.5 IM SUSP
0.5000 mL | Freq: Once | INTRAMUSCULAR | Status: AC
  Administered 2019-03-30: 0.5 mL via INTRAMUSCULAR
  Filled 2019-03-30: qty 0.5

## 2019-03-30 MED ORDER — BACITRACIN ZINC 500 UNIT/GM EX OINT
1.0000 "application " | TOPICAL_OINTMENT | Freq: Two times a day (BID) | CUTANEOUS | Status: DC
  Administered 2019-03-30: 1 via TOPICAL
  Filled 2019-03-30: qty 0.9

## 2019-03-30 NOTE — ED Notes (Signed)
RN gave pt a sandwich bag and water.
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
## 2019-06-04 NOTE — Progress Notes (Signed)
   CC: diabetes, hypertension  HPI:  [name] is a 53 y.o. who presents for 6 month follow up for chronic medical conditions.   No particular complaints at today's visit. We did discuss how his alcohol cessation is going.  Reports that he is drinking about 5-6 beers during the week and up to 12 a day on the weekends.  When I inquired about if he is using the naltrexone, he seemed unsure of this.  He endorses continued cravings although not as severe as they were in the past.  He denies a history of rehabilitation programs.  We also did discuss his tobacco use.  He notes he is smoking about a pack a day.  He recognizes that this is not healthy and would like to quit.  So discussed how he may be more successful if he attempts cessation at alcohol or tobacco individually and not at the same time.  Patient notes that he would rather work on the alcohol first.  Discussed colonoscopy.  He denies any family history of colon cancer.  No previous colon cancer screening in the past.  Denies any known hematochezia.  Denies constipation or diarrhea.  No abdominal pain.  We also discussed his diabetes.  A1c is 5.7 today.  Patient congratulated.  He reports good compliance with his current regimen.  Denies any issues with hypoglycemia.    Past Medical History:  Diagnosis Date  . Asthma   . ETOH abuse   . Hypertension   . TBI (traumatic brain injury) ([place])   . Type 2 diabetes mellitus, uncontrolled ([place]) 2010   Review of Systems:  ROS: No TIA's or unusual headaches, no dysphagia.  No prolonged cough. No dyspnea or chest pain on exertion.  No abdominal pain, change in bowel habits, black or bloody stools.  No urinary tract symptoms.  No new or unusual musculoskeletal symptoms.    Physical Exam:  Vitals:   06/05/19 1426  BP: 114/72  Pulse: (!) 102  Resp: (!) 32  Temp: 98.1 F (36.7 C)  TempSrc: Oral  SpO2: 98%  Weight: 172 lb 8 oz (78.2 kg)    GENERAL: well appearing, in no apparent  distress HEENT: no conjunctival injection. Nares patent.  CARDIAC: heart regular rate and rhythm, no peripheral edema appreciated PULMONARY: lung sounds clear to auscultation.  Mild expiratory wheezes appreciated. ABDOMEN: bowel sounds active.  SKIN: no rash or lesion on limited exam NEURO: CN II-XII grossly intact   Assessment & Plan:   See Encounters Tab for problem based charting.  Pertinent labs & imaging results that were available during my care of the patient were reviewed by me and considered in my medical decision making  Patient is in agreement with the plan and endorses no further questions at this time.  Patient seen with Dr. Angelia Mould  Mitzi Hansen, MD Internal Medicine Resident-PGY1 06/05/19

## 2019-06-05 ENCOUNTER — Encounter: Payer: Self-pay | Admitting: Internal Medicine

## 2019-06-05 ENCOUNTER — Ambulatory Visit: Payer: Medicaid Other | Admitting: Internal Medicine

## 2019-06-05 ENCOUNTER — Other Ambulatory Visit: Payer: Self-pay

## 2019-06-05 VITALS — BP 114/72 | HR 102 | Temp 98.1°F | Resp 32 | Wt 172.5 lb

## 2019-06-05 DIAGNOSIS — I10 Essential (primary) hypertension: Secondary | ICD-10-CM | POA: Diagnosis not present

## 2019-06-05 DIAGNOSIS — S069X9S Unspecified intracranial injury with loss of consciousness of unspecified duration, sequela: Secondary | ICD-10-CM

## 2019-06-05 DIAGNOSIS — E118 Type 2 diabetes mellitus with unspecified complications: Secondary | ICD-10-CM | POA: Diagnosis not present

## 2019-06-05 DIAGNOSIS — E1169 Type 2 diabetes mellitus with other specified complication: Secondary | ICD-10-CM

## 2019-06-05 DIAGNOSIS — J4540 Moderate persistent asthma, uncomplicated: Secondary | ICD-10-CM

## 2019-06-05 DIAGNOSIS — Z7289 Other problems related to lifestyle: Secondary | ICD-10-CM

## 2019-06-05 DIAGNOSIS — F1020 Alcohol dependence, uncomplicated: Secondary | ICD-10-CM

## 2019-06-05 DIAGNOSIS — Z7984 Long term (current) use of oral hypoglycemic drugs: Secondary | ICD-10-CM | POA: Diagnosis not present

## 2019-06-05 DIAGNOSIS — Z794 Long term (current) use of insulin: Secondary | ICD-10-CM | POA: Diagnosis not present

## 2019-06-05 DIAGNOSIS — E1159 Type 2 diabetes mellitus with other circulatory complications: Secondary | ICD-10-CM

## 2019-06-05 DIAGNOSIS — E785 Hyperlipidemia, unspecified: Secondary | ICD-10-CM

## 2019-06-05 DIAGNOSIS — F17210 Nicotine dependence, cigarettes, uncomplicated: Secondary | ICD-10-CM | POA: Diagnosis not present

## 2019-06-05 DIAGNOSIS — G4700 Insomnia, unspecified: Secondary | ICD-10-CM

## 2019-06-05 DIAGNOSIS — Z789 Other specified health status: Secondary | ICD-10-CM

## 2019-06-05 DIAGNOSIS — Z720 Tobacco use: Secondary | ICD-10-CM

## 2019-06-05 DIAGNOSIS — K219 Gastro-esophageal reflux disease without esophagitis: Secondary | ICD-10-CM

## 2019-06-05 DIAGNOSIS — Z1211 Encounter for screening for malignant neoplasm of colon: Secondary | ICD-10-CM

## 2019-06-05 DIAGNOSIS — E119 Type 2 diabetes mellitus without complications: Secondary | ICD-10-CM

## 2019-06-05 DIAGNOSIS — Z79899 Other long term (current) drug therapy: Secondary | ICD-10-CM

## 2019-06-05 LAB — POCT GLYCOSYLATED HEMOGLOBIN (HGB A1C): Hemoglobin A1C: 5.7 % — AB (ref 4.0–5.6)

## 2019-06-05 LAB — GLUCOSE, CAPILLARY: Glucose-Capillary: 125 mg/dL — ABNORMAL HIGH (ref 70–99)

## 2019-06-05 MED ORDER — AMITRIPTYLINE HCL 100 MG PO TABS: 100.0000 mg | ORAL_TABLET | Freq: Every day | ORAL | 3 refills | Status: DC

## 2019-06-05 MED ORDER — NICOTINE POLACRILEX 4 MG MT GUM: 4.0000 mg | CHEWING_GUM | OROMUCOSAL | 0 refills | Status: DC | PRN

## 2019-06-05 NOTE — Patient Instructions (Addendum)
We are going to cut your invokana dose down since your A1C is improved. You will take 150mg  daily. I will send this change to your pharmacy. Please come back in 3 months for a recheck on your diabetes. It was a pleasure meeting you!

## 2019-06-06 ENCOUNTER — Encounter: Payer: Self-pay | Admitting: Internal Medicine

## 2019-06-06 LAB — CMP14 + ANION GAP
ALT: 31 IU/L (ref 0–44)
AST: 39 IU/L (ref 0–40)
Albumin/Globulin Ratio: 2 (ref 1.2–2.2)
Albumin: 4.8 g/dL (ref 3.8–4.9)
Alkaline Phosphatase: 116 IU/L (ref 39–117)
Anion Gap: 23 mmol/L — ABNORMAL HIGH (ref 10.0–18.0)
BUN/Creatinine Ratio: 8 — ABNORMAL LOW (ref 9–20)
BUN: 9 mg/dL (ref 6–24)
Bilirubin Total: 0.7 mg/dL (ref 0.0–1.2)
CO2: 16 mmol/L — ABNORMAL LOW (ref 20–29)
Calcium: 9.7 mg/dL (ref 8.7–10.2)
Chloride: 96 mmol/L (ref 96–106)
Creatinine, Ser: 1.15 mg/dL (ref 0.76–1.27)
GFR calc Af Amer: 84 mL/min/{1.73_m2} (ref >59)
GFR calc non Af Amer: 73 mL/min/{1.73_m2} (ref >59)
Globulin, Total: 2.4 g/dL (ref 1.5–4.5)
Glucose: 110 mg/dL — ABNORMAL HIGH (ref 65–99)
Potassium: 4.2 mmol/L (ref 3.5–5.2)
Sodium: 135 mmol/L (ref 134–144)
Total Protein: 7.2 g/dL (ref 6.0–8.5)

## 2019-06-06 LAB — CBC
Hematocrit: 45.9 % (ref 37.5–51.0)
Hemoglobin: 15.5 g/dL (ref 13.0–17.7)
MCH: 27.5 pg (ref 26.6–33.0)
MCHC: 33.8 g/dL (ref 31.5–35.7)
MCV: 82 fL (ref 79–97)
Platelets: 276 10*3/uL (ref 150–450)
RBC: 5.63 x10E6/uL (ref 4.14–5.80)
RDW: 15.2 % (ref 11.6–15.4)
WBC: 9.7 10*3/uL (ref 3.4–10.8)

## 2019-06-06 MED ORDER — CANAGLIFLOZIN 100 MG PO TABS: 200.0000 mg | ORAL_TABLET | Freq: Every day | ORAL | 0 refills | Status: AC

## 2019-06-06 MED ORDER — ATORVASTATIN CALCIUM 40 MG PO TABS: 40.0000 mg | ORAL_TABLET | Freq: Every day | ORAL | 3 refills | Status: DC

## 2019-06-06 MED ORDER — NALTREXONE HCL 50 MG PO TABS: 50.0000 mg | ORAL_TABLET | Freq: Every day | ORAL | 3 refills | Status: DC

## 2019-06-06 MED ORDER — ENALAPRIL MALEATE 10 MG PO TABS: 10.0000 mg | ORAL_TABLET | Freq: Every day | ORAL | 3 refills | Status: DC

## 2019-06-06 MED ORDER — PANTOPRAZOLE SODIUM 40 MG PO TBEC: 40.0000 mg | DELAYED_RELEASE_TABLET | Freq: Every day | ORAL | 3 refills | Status: DC

## 2019-06-06 MED ORDER — ALBUTEROL SULFATE HFA 108 (90 BASE) MCG/ACT IN AERS: INHALATION_SPRAY | RESPIRATORY_TRACT | 6 refills | Status: DC

## 2019-06-06 MED ORDER — METFORMIN HCL 1000 MG PO TABS: 1000.0000 mg | ORAL_TABLET | Freq: Two times a day (BID) | ORAL | 2 refills | Status: DC

## 2019-06-06 NOTE — Assessment & Plan Note (Signed)
Currently on atorvastatin 40 mg.  Denies any side effects with this. Plan: Continue current management.  Recheck lipids at next visit.

## 2019-06-06 NOTE — Assessment & Plan Note (Signed)
We discussed colon cancer screening at today's visit.  Patient denies any family history of this.  No personal history either. Plan: We will place referral for colonoscopy.

## 2019-06-06 NOTE — Assessment & Plan Note (Signed)
We did discuss his tobacco use.  Reports smoking about 1 pack a day.  He has quit in the past however restarted about 2 years ago.  He does wish to quit. Plan: Discussed the importance of quitting however patient will also like to quit drinking and I think that we should work on one thing at a time.  We will continue to discuss this at future appointments.

## 2019-06-06 NOTE — Assessment & Plan Note (Addendum)
Current medications: Metformin 1000 mg twice daily, Invokana 300 mg daily.  Atorvastatin 40 mg A1c in the office today is 5.7. Denies any issues with hypoglycemia at home.   Plan: We will decrease Invokana to 200mg  daily.  Continue metformin.  Diabetes recheck in 3 months. Ophthalmology referral placed.

## 2019-06-06 NOTE — Assessment & Plan Note (Addendum)
Home medications: Enalapril 10 mg daily. Patient denies any issues with orthostasis, vertigo or dizziness.  Blood pressure in office today 114/72. Renal function checked today and is appropriate. Plan: Continue current management.

## 2019-06-06 NOTE — Assessment & Plan Note (Signed)
Patient reports drinking about 5 or 6 beers during the weekdays and 12+ beers on the weekends.  Patient recognizes that this is not healthy behavior.  He reports that he would like to quit drinking however upon chart review it looks like he has been prescribed naltrexone however he has not been taking it. Plan: Encouraged him to work on this.  I will be more than happy to refill the naltrexone.  We also briefly discussed a behavioral health referral.  Patient was agreeable with this plan.

## 2019-06-10 ENCOUNTER — Encounter: Payer: Self-pay | Admitting: Licensed Clinical Social Worker

## 2019-06-10 ENCOUNTER — Telehealth: Payer: Self-pay | Admitting: Licensed Clinical Social Worker

## 2019-06-10 NOTE — Telephone Encounter (Signed)
Patient was contacted to discuss a referral from his doctor (1st attempt). Patient did not answer, and a vm was left for the patient.

## 2019-06-10 NOTE — Progress Notes (Signed)
Internal Medicine Clinic Attending  I saw and evaluated the patient.  I personally confirmed the key portions of the history and exam documented by Dr. Darrick Meigs and I reviewed pertinent patient test results.  The assessment, diagnosis, and plan were formulated together and I agree with the documentation in the resident's note.     Agree except reduce dose of Invokana to 100mg  daily.

## 2019-07-01 ENCOUNTER — Encounter: Payer: Self-pay | Admitting: Licensed Clinical Social Worker

## 2019-07-01 ENCOUNTER — Telehealth: Payer: Self-pay | Admitting: Licensed Clinical Social Worker

## 2019-07-01 NOTE — Telephone Encounter (Signed)
Both numbers were called in the patient's chart to discuss a referral from his doctor (2nd attempt). Patient did not answer, and a vm was left for the patient to contact our office to establish care with myself.

## 2019-07-07 ENCOUNTER — Encounter: Payer: Self-pay | Admitting: Internal Medicine

## 2019-07-15 ENCOUNTER — Emergency Department (HOSPITAL_COMMUNITY)
Admission: EM | Admit: 2019-07-15 | Discharge: 2019-07-15 | Discharge status: Absent without leave | Payer: Medicaid Other

## 2019-07-15 ENCOUNTER — Ambulatory Visit (INDEPENDENT_AMBULATORY_CARE_PROVIDER_SITE_OTHER): Payer: Medicaid Other | Admitting: Licensed Clinical Social Worker

## 2019-07-15 ENCOUNTER — Other Ambulatory Visit: Payer: Self-pay

## 2019-07-15 ENCOUNTER — Ambulatory Visit: Payer: Medicaid Other | Admitting: Licensed Clinical Social Worker

## 2019-07-15 ENCOUNTER — Telehealth: Payer: Self-pay | Admitting: Licensed Clinical Social Worker

## 2019-07-15 ENCOUNTER — Encounter: Payer: Self-pay | Admitting: Licensed Clinical Social Worker

## 2019-07-15 DIAGNOSIS — Z720 Tobacco use: Secondary | ICD-10-CM

## 2019-07-15 MED ORDER — NICOTINE 14 MG/24HR TD PT24: 14.0000 mg | MEDICATED_PATCH | TRANSDERMAL | 2 refills | Status: DC

## 2019-07-15 NOTE — BH Specialist Note (Signed)
Integrated Behavioral Health Initial Visit  MRN: [id number] Name: Ruben Pierce  Number of [place] Clinician visits:: 1/6 Session Start time: 10:06  Session End time: 10:46 Total time: 40 minutes  Type of Service: Integrated Behavioral Health- Individual Interpretor:No.        SUBJECTIVE: Ruben Pierce is a 53 y.o. male  whom attended the session indivdiually.  Patient was referred by Dr. Darrick Pierce for smoking cessation Patient reports the following symptoms/concerns: negative coping skills and challenges with memory.  Duration of problem: increased over the past 2 years; Severity of problem: mild  OBJECTIVE: Mood: Netural and Affect: Appropriate Risk of harm to self or others: No plan to harm self or others  LIFE CONTEXT: Family and Social: Patient reported he is currently living with a friend named "Ruben Pierce". Patient reported this is a healthy living situation, and moved in with Ruben Pierce around 2-3 months ago.  School/Work: Not working, patient is on disability.  Self-Care: Patient has issues with memory, fiances, reading, and writing due to a brain injury.  Life Changes: Patient reported starting back smoking after being released from prison 2 years ago.   GOALS ADDRESSED: Patient will: 1. Reduce symptoms of: negative coping skills (consumption of alcohol and cigarettes).  2. Increase knowledge and/or ability of: coping skills and healthy habits  3. Demonstrate ability to: Increase adequate support systems for patient/family and Decrease self-medicating behaviors  INTERVENTIONS: Interventions utilized: Motivational Interviewing, Supportive Counseling and Medication Monitoring  Standardized Assessments completed: assessed for SI, HI, and self-harm.  ASSESSMENT: Patient currently experiencing mild to moderate challenges with implementing healthy coping skills into his life. Patient reported that since being released from prison 2 years ago, he has started  consuming alcohol and smoking cigarettes on a regular basis. Patient is currently smoking one pack of cigarettes daily. Patient reported his roommate also smokes cigarettes, but he does not see this as a barrier to decreasing his smoking habits. Patient reported titration would not be effective because his brain tells him to smoke, and he does not have the self-control to reduce his intake on his own free will. Patient is requesting the patch to assist him with quitting. Patient did identify other healthy coping skills that he can engage in when having the desire to smoke.    Patient reported a decrease in alcohol consumption, and reported his is not consuming alcohol daily any longer. Patient did understand that alcohol consumption and smoking cigarettes were not healthy alternatives to dealing with stress. Patient is in the contemplation stage of change. Patient plans to begin reduction by the beginning on next month.   Patient may benefit from counseling.  PLAN: 1. Follow up with behavioral health clinician on : two to three week.   Ruben Pierce, Ruben Pierce, Ruben Pierce

## 2019-07-15 NOTE — Telephone Encounter (Signed)
Per [place], pt would like to be prescribed some nicotine patches, if they have one that doesn't require daily change he would like that better, he does not always remember to do things like he should.

## 2019-08-12 ENCOUNTER — Ambulatory Visit: Payer: Medicaid Other | Admitting: Licensed Clinical Social Worker

## 2019-08-21 ENCOUNTER — Other Ambulatory Visit: Payer: Self-pay | Admitting: Internal Medicine

## 2019-08-21 DIAGNOSIS — G4700 Insomnia, unspecified: Secondary | ICD-10-CM

## 2019-09-02 ENCOUNTER — Ambulatory Visit: Payer: Medicaid Other | Admitting: Licensed Clinical Social Worker

## 2019-09-04 ENCOUNTER — Other Ambulatory Visit: Payer: Self-pay | Admitting: Internal Medicine

## 2019-09-06 ENCOUNTER — Other Ambulatory Visit: Payer: Self-pay

## 2019-09-06 ENCOUNTER — Emergency Department (HOSPITAL_COMMUNITY)
Admission: EM | Admit: 2019-09-06 | Discharge: 2019-09-07 | Disposition: A | Discharge status: Home / Self Care | Payer: Medicaid Other | Attending: Emergency Medicine | Admitting: Emergency Medicine

## 2019-09-06 DIAGNOSIS — I10 Essential (primary) hypertension: Secondary | ICD-10-CM | POA: Diagnosis not present

## 2019-09-06 DIAGNOSIS — Y906 Blood alcohol level of 120-199 mg/100 ml: Secondary | ICD-10-CM | POA: Insufficient documentation

## 2019-09-06 DIAGNOSIS — Z79899 Other long term (current) drug therapy: Secondary | ICD-10-CM | POA: Diagnosis not present

## 2019-09-06 DIAGNOSIS — R0609 Other forms of dyspnea: Secondary | ICD-10-CM | POA: Diagnosis present

## 2019-09-06 DIAGNOSIS — F1910 Other psychoactive substance abuse, uncomplicated: Secondary | ICD-10-CM

## 2019-09-06 DIAGNOSIS — F17210 Nicotine dependence, cigarettes, uncomplicated: Secondary | ICD-10-CM | POA: Diagnosis not present

## 2019-09-06 DIAGNOSIS — Z8709 Personal history of other diseases of the respiratory system: Secondary | ICD-10-CM | POA: Diagnosis not present

## 2019-09-06 DIAGNOSIS — F1490 Cocaine use, unspecified, uncomplicated: Secondary | ICD-10-CM | POA: Insufficient documentation

## 2019-09-06 DIAGNOSIS — Z7984 Long term (current) use of oral hypoglycemic drugs: Secondary | ICD-10-CM | POA: Diagnosis not present

## 2019-09-06 DIAGNOSIS — Z87820 Personal history of traumatic brain injury: Secondary | ICD-10-CM | POA: Diagnosis not present

## 2019-09-06 DIAGNOSIS — F10920 Alcohol use, unspecified with intoxication, uncomplicated: Secondary | ICD-10-CM | POA: Insufficient documentation

## 2019-09-06 DIAGNOSIS — E119 Type 2 diabetes mellitus without complications: Secondary | ICD-10-CM | POA: Diagnosis not present

## 2019-09-06 LAB — CBC
HCT: 46.1 % (ref 39.0–52.0)
Hemoglobin: 15.6 g/dL (ref 13.0–17.0)
MCH: 28.6 pg (ref 26.0–34.0)
MCHC: 33.8 g/dL (ref 30.0–36.0)
MCV: 84.6 fL (ref 80.0–100.0)
Platelets: 299 10*3/uL (ref 150–400)
RBC: 5.45 MIL/uL (ref 4.22–5.81)
RDW: 15.3 % (ref 11.5–15.5)
WBC: 7.2 10*3/uL (ref 4.0–10.5)
nRBC: 0 % (ref 0.0–0.2)

## 2019-09-06 NOTE — ED Triage Notes (Addendum)
Patient has been drinking alcohol today and states he wants to be seen for "everything" but gave no specifics. EMS states that they got a call for pt "not breathing", when they arrived pt was found to be breathing but not answering questions. EMS states that pt may be behavioral.

## 2019-09-07 LAB — RAPID URINE DRUG SCREEN, HOSP PERFORMED
Amphetamines: NOT DETECTED
Barbiturates: NOT DETECTED
Benzodiazepines: NOT DETECTED
Cocaine: POSITIVE — AB
Opiates: NOT DETECTED
Tetrahydrocannabinol: NOT DETECTED

## 2019-09-07 LAB — COMPREHENSIVE METABOLIC PANEL
ALT: 46 U/L — ABNORMAL HIGH (ref 0–44)
AST: 44 U/L — ABNORMAL HIGH (ref 15–41)
Albumin: 4.5 g/dL (ref 3.5–5.0)
Alkaline Phosphatase: 109 U/L (ref 38–126)
Anion gap: 12 (ref 5–15)
BUN: 6 mg/dL (ref 6–20)
CO2: 23 mmol/L (ref 22–32)
Calcium: 9.3 mg/dL (ref 8.9–10.3)
Chloride: 101 mmol/L (ref 98–111)
Creatinine, Ser: 0.82 mg/dL (ref 0.61–1.24)
GFR calc Af Amer: >60 mL/min (ref >60)
GFR calc non Af Amer: >60 mL/min (ref >60)
Glucose, Bld: 85 mg/dL (ref 70–99)
Potassium: 3.8 mmol/L (ref 3.5–5.1)
Sodium: 136 mmol/L (ref 135–145)
Total Bilirubin: 0.5 mg/dL (ref 0.3–1.2)
Total Protein: 7.8 g/dL (ref 6.5–8.1)

## 2019-09-07 LAB — SALICYLATE LEVEL: Salicylate Lvl: <7 mg/dL (ref 2.8–30.0)

## 2019-09-07 LAB — ACETAMINOPHEN LEVEL: Acetaminophen (Tylenol), Serum: <10 ug/mL — ABNORMAL LOW (ref 10–30)

## 2019-09-07 LAB — ETHANOL: Alcohol, Ethyl (B): 256 mg/dL — ABNORMAL HIGH (ref <10)

## 2019-09-07 NOTE — ED Provider Notes (Signed)
[facility]   CSN: [id number] Arrival date & time: 09/06/19  2252     History   Chief Complaint Chief Complaint  Patient presents with  . Medical Clearance    HPI Ruben Pierce is a 53 y.o. male.     Patient brought in to the emergency department by EMS after being called out because patient reported he was not breathing.  Here now, patient denies any complaints.  He states he is uncertain why he is here.  He has been drinking alcohol.  He denies any suicidal or homicidal thoughts.  States that he would like to go home.  The history is provided by the patient. No language interpreter was used.    Past Medical History:  Diagnosis Date  . Asthma   . ETOH abuse   . Hypertension   . TBI (traumatic brain injury) (HCC)   . Type 2 diabetes mellitus, uncontrolled (HCC) 2010    Patient Active Problem List   Diagnosis Date Noted  . Hand trauma, left, initial encounter 12/19/2018  . Hypertrophic toenail 03/21/2018  . Alcohol use disorder, moderate, dependence (HCC) 09/05/2017  . Colon cancer screening 05/24/2016  . GERD (gastroesophageal reflux disease) 05/24/2016  . Moderate persistent asthma 01/24/2016  . Tobacco abuse 01/24/2016  . Microcytic anemia 01/24/2016  . Hyperlipidemia associated with type 2 diabetes mellitus (HCC) 11/11/2015  . Insomnia 11/11/2015  . Type 2 diabetes mellitus (HCC) 11/08/2015  . Hypertension associated with diabetes (HCC) 11/08/2015  . TBI (traumatic brain injury) [facility](HCC)     Past Surgical History:  Procedure Laterality Date  . BRAIN SURGERY  1999   Head injury, fell from tree 25 feet  . BUNIONECTOMY Right 2014        Home Medications    Prior to Admission medications   Medication Sig Start Date End Date Taking? Authorizing Provider  acetaminophen (TYLENOL) 325 MG tablet Take 2 tablets (650 mg total) by mouth every 6 (six) hours as needed. 04/20/17   Audry PiliMohr, Tyler, PA-C  albuterol  (PROAIR HFA) 108 (90 Base) MCG/ACT inhaler INHALE 2 PUFFS INTO THE LUNGS EVERY 6 HOURS AS NEEDED FOR WHEEZING OR SHORTNESS OF BREATH 06/06/19   Elige Radonhristian, Rylee, MD  amitriptyline (ELAVIL) 100 MG tablet Take 1 tablet (100 mg total) by mouth at bedtime. 08/23/19   Elige Radonhristian, Rylee, MD  atorvastatin (LIPITOR) 40 MG tablet Take 1 tablet (40 mg total) by mouth daily. 06/06/19   Elige Radonhristian, Rylee, MD  enalapril (VASOTEC) 10 MG tablet Take 1 tablet (10 mg total) by mouth daily. 06/06/19   Elige Radonhristian, Rylee, MD  metFORMIN (GLUCOPHAGE) 1000 MG tablet Take 1 tablet (1,000 mg total) by mouth 2 (two) times daily with a meal. 06/06/19   Christian, Rylee, MD  naltrexone (DEPADE) 50 MG tablet Take 1 tablet (50 mg total) by mouth daily. 06/06/19   Elige Radonhristian, Rylee, MD  nicotine (NICODERM CQ - DOSED IN MG/24 HOURS) 14 mg/24hr patch Place 1 patch (14 mg total) onto the skin daily. 09/04/19 10/04/19  Elige Radonhristian, Rylee, MD  nicotine polacrilex (NICORETTE) 4 MG gum Take 1 each (4 mg total) by mouth as needed for smoking cessation. 06/05/19   Elige Radonhristian, Rylee, MD  pantoprazole (PROTONIX) 40 MG tablet Take 1 tablet (40 mg total) by mouth daily. 06/06/19   Elige Radonhristian, Rylee, MD    Family History Family History  Problem Relation Age of Onset  . CAD Mother   . Diabetes Mellitus II Father   . Hypertension Father   .  Hyperlipidemia Father     Social History Social History   Tobacco Use  . Smoking status: Current Every Day Smoker    Packs/day: 0.50    Types: Cigarettes  . Smokeless tobacco: Never Used  . Tobacco comment: 1/2/pk per day   Substance Use Topics  . Alcohol use: Yes    Alcohol/week: 0.0 standard drinks    Comment: Beer.; 12 cans a day.  . Drug use: Yes    Types: Marijuana     Allergies   Patient has no known allergies.   Review of Systems Review of Systems  All other systems reviewed and are negative.    Physical Exam Updated Vital Signs BP (!) 137/94 (BP Location: Right Arm)   Pulse 96   Temp 98.3 F  (36.8 C) (Oral)   Resp 18   Ht 6\' 1"  (1.854 m)   Wt 76.7 kg   SpO2 96%   BMI 22.30 kg/m   Physical Exam Vitals signs and nursing note reviewed.  Constitutional:      Appearance: He is well-developed.  HENT:     Head: Normocephalic and atraumatic.  Eyes:     Conjunctiva/sclera: Conjunctivae normal.  Neck:     Musculoskeletal: Neck supple.  Cardiovascular:     Rate and Rhythm: Normal rate and regular rhythm.     Heart sounds: No murmur.  Pulmonary:     Effort: Pulmonary effort is normal. No respiratory distress.     Breath sounds: Normal breath sounds.     Comments: Lung sounds are clear Abdominal:     Palpations: Abdomen is soft.     Tenderness: There is no abdominal tenderness.  Musculoskeletal: Normal range of motion.  Skin:    General: Skin is warm and dry.  Neurological:     Mental Status: He is alert and oriented to person, place, and time.  Psychiatric:        Mood and Affect: Mood normal.        Behavior: Behavior normal.      ED Treatments / Results  Labs (all labs ordered are listed, but only abnormal results are displayed) Labs Reviewed  COMPREHENSIVE METABOLIC PANEL - Abnormal; Notable for the following components:      Result Value   AST 44 (*)    ALT 46 (*)    All other components within normal limits  ETHANOL - Abnormal; Notable for the following components:   Alcohol, Ethyl (B) 256 (*)    All other components within normal limits  ACETAMINOPHEN LEVEL - Abnormal; Notable for the following components:   Acetaminophen (Tylenol), Serum <10 (*)    All other components within normal limits  RAPID URINE DRUG SCREEN, HOSP PERFORMED - Abnormal; Notable for the following components:   Cocaine POSITIVE (*)    All other components within normal limits  SALICYLATE LEVEL  CBC    EKG None  Radiology No results found.  Procedures Procedures (including critical care time)  Medications Ordered in ED Medications - No data to display   Initial  Impression / Assessment and Plan / ED Course  I have reviewed the triage vital signs and the nursing notes.  Pertinent labs & imaging results that were available during my care of the patient were reviewed by me and considered in my medical decision making (see chart for details).        Patient brought in by EMS after they were called out because the patient thought that he was not breathing.  He had  been using alcohol tonight, and used cocaine yesterday.  Currently, the patient denies any complaints.  His vital signs are stable.  He is alert and oriented.  He is in no acute distress.  He is requesting to be discharged.  He does not have any suicidal or homicidal thoughts.  I believe him to be clinically stable for discharge.  Final Clinical Impressions(s) / ED Diagnoses   Final diagnoses:  Polysubstance abuse [facility])    ED Discharge Orders    None       Roxy Horseman, PA-C 09/07/19 0433    Palumbo, April, MD 09/07/19 [phone]

## 2019-09-11 ENCOUNTER — Ambulatory Visit: Payer: Medicaid Other

## 2019-09-11 ENCOUNTER — Encounter: Payer: Self-pay | Admitting: Internal Medicine

## 2019-09-16 ENCOUNTER — Ambulatory Visit (INDEPENDENT_AMBULATORY_CARE_PROVIDER_SITE_OTHER): Payer: Medicaid Other | Admitting: Licensed Clinical Social Worker

## 2019-09-16 ENCOUNTER — Encounter: Payer: Self-pay | Admitting: Licensed Clinical Social Worker

## 2019-09-16 DIAGNOSIS — Z789 Other specified health status: Secondary | ICD-10-CM

## 2019-09-16 DIAGNOSIS — Z7289 Other problems related to lifestyle: Secondary | ICD-10-CM

## 2019-09-16 DIAGNOSIS — Z720 Tobacco use: Secondary | ICD-10-CM

## 2019-09-16 NOTE — BH Specialist Note (Signed)
Integrated Behavioral Health Follow Up Visit  MRN: [id number] Name: Ruben Pierce  Number of [place] Clinician visits: 2/6 Session Start time: 9:05  Session End time: 9:40 Total time: 35  minutes  Type of Service: [place]- Individual Interpretor:No.   SUBJECTIVE: Ruben Pierce is a 53 y.o. male accompanied by whom attended the session individually. Patient was referred by Dr. Darrick Pierce for smoking cessation. Patient reports the following symptoms/concerns: negative coping skills, mild periods of sadness, and challenges with memory. Duration of problem: increased over the past two years; Severity of problem: mild  OBJECTIVE: Mood: Netural and Affect: Appropriate Risk of harm to self or others: No plan to harm self or others  LIFE CONTEXT: Family and Social: Patient is still living with his friend "Ruben Pierce", whom he identifies as a support. Self-Care: Enjoys working in the garden and riding his bicycle. Patient could still benefit from adding healthy coping skills in place of his negative ones. Life Changes: Patient was in the hospital for difficulty breathing/alcohol abuse a few weeks ago.   GOALS ADDRESSED: Patient will: 1.  Reduce symptoms of: depression, stress and negative coping skills (consumption of alcohol and cigarettes).  2.  Increase knowledge and/or ability of: coping skills and healthy habits  3.  Demonstrate ability to: Increase adequate support systems for patient/family, Increase motivation to adhere to plan of care and Decrease self-medicating behaviors  INTERVENTIONS: Interventions utilized:  Motivational Interviewing and Supportive Counseling Standardized Assessments completed: Not Needed  ASSESSMENT: Patient currently experiencing moderate levels of difficulty implementing healthy coping skills on a regular basis. Patient reported he has made improvements with his smoking amount, and is currently smoking 8-9 cigarettes a  day. Patient is taking his medication consistently to reduce nicotine cravings, and he reported this medication is helpful. Patient is still motivated to decrease his smoking habits.    Patient reported since our last session, he went about two weeks without any alcohol (only withdrawal symptom reported was diarrhea). Patient is denying any addiction to alcohol, and believes that he can quit at any time. Patient confirmed that he is continuing to drink an average of 12 beers a day, and does not think this is a large amount of alcohol. Patient was in agreement to try to reduce his alcohol content, and understands that consuming that amount of alcohol on a regular basis could be harmful to his health.   Patient was hospitalized several weeks ago due to difficulty breathing. Patient reported the doctor was concerned about other drugs in his system (cocaine). Patient is denying any cocaine usage, and is unsure how this substance showed in his system.  Patient may benefit from counseling.  PLAN: 1. Follow up with behavioral health clinician on : one month.  Ruben Pierce, [facility], [place]

## 2019-10-14 ENCOUNTER — Other Ambulatory Visit: Payer: Self-pay

## 2019-10-14 ENCOUNTER — Ambulatory Visit: Payer: Medicaid Other | Admitting: Licensed Clinical Social Worker

## 2019-10-21 ENCOUNTER — Encounter: Payer: Self-pay | Admitting: Internal Medicine

## 2019-10-21 ENCOUNTER — Ambulatory Visit: Payer: Medicaid Other

## 2019-10-28 ENCOUNTER — Encounter: Payer: Self-pay | Admitting: Licensed Clinical Social Worker

## 2019-10-28 ENCOUNTER — Ambulatory Visit: Payer: Medicaid Other | Admitting: Internal Medicine

## 2019-10-28 ENCOUNTER — Encounter: Payer: Self-pay | Admitting: Internal Medicine

## 2019-10-28 ENCOUNTER — Other Ambulatory Visit: Payer: Self-pay

## 2019-10-28 ENCOUNTER — Ambulatory Visit (INDEPENDENT_AMBULATORY_CARE_PROVIDER_SITE_OTHER): Payer: Medicaid Other | Admitting: Licensed Clinical Social Worker

## 2019-10-28 VITALS — BP 116/68 | HR 100 | Temp 97.8°F | Ht 73.0 in | Wt 169.1 lb

## 2019-10-28 DIAGNOSIS — Z720 Tobacco use: Secondary | ICD-10-CM | POA: Diagnosis not present

## 2019-10-28 DIAGNOSIS — G4700 Insomnia, unspecified: Secondary | ICD-10-CM | POA: Diagnosis not present

## 2019-10-28 DIAGNOSIS — Z1211 Encounter for screening for malignant neoplasm of colon: Secondary | ICD-10-CM

## 2019-10-28 DIAGNOSIS — J4540 Moderate persistent asthma, uncomplicated: Secondary | ICD-10-CM | POA: Diagnosis not present

## 2019-10-28 DIAGNOSIS — Z79899 Other long term (current) drug therapy: Secondary | ICD-10-CM

## 2019-10-28 DIAGNOSIS — E1169 Type 2 diabetes mellitus with other specified complication: Secondary | ICD-10-CM

## 2019-10-28 DIAGNOSIS — E119 Type 2 diabetes mellitus without complications: Secondary | ICD-10-CM

## 2019-10-28 DIAGNOSIS — E785 Hyperlipidemia, unspecified: Secondary | ICD-10-CM | POA: Diagnosis not present

## 2019-10-28 DIAGNOSIS — I10 Essential (primary) hypertension: Secondary | ICD-10-CM

## 2019-10-28 DIAGNOSIS — E1159 Type 2 diabetes mellitus with other circulatory complications: Secondary | ICD-10-CM

## 2019-10-28 DIAGNOSIS — Z794 Long term (current) use of insulin: Secondary | ICD-10-CM

## 2019-10-28 DIAGNOSIS — F1020 Alcohol dependence, uncomplicated: Secondary | ICD-10-CM

## 2019-10-28 DIAGNOSIS — E118 Type 2 diabetes mellitus with unspecified complications: Secondary | ICD-10-CM

## 2019-10-28 DIAGNOSIS — K219 Gastro-esophageal reflux disease without esophagitis: Secondary | ICD-10-CM

## 2019-10-28 DIAGNOSIS — Z7289 Other problems related to lifestyle: Secondary | ICD-10-CM

## 2019-10-28 DIAGNOSIS — F1090 Alcohol use, unspecified, uncomplicated: Secondary | ICD-10-CM

## 2019-10-28 DIAGNOSIS — I152 Hypertension secondary to endocrine disorders: Secondary | ICD-10-CM

## 2019-10-28 DIAGNOSIS — Z789 Other specified health status: Secondary | ICD-10-CM

## 2019-10-28 LAB — POCT GLYCOSYLATED HEMOGLOBIN (HGB A1C): Hemoglobin A1C: 5.5 % (ref 4.0–5.6)

## 2019-10-28 LAB — GLUCOSE, CAPILLARY: Glucose-Capillary: 104 mg/dL — ABNORMAL HIGH (ref 70–99)

## 2019-10-28 MED ORDER — ALBUTEROL SULFATE HFA 108 (90 BASE) MCG/ACT IN AERS: INHALATION_SPRAY | RESPIRATORY_TRACT | 6 refills | Status: DC

## 2019-10-28 MED ORDER — FREESTYLE LANCETS MISC: 12 refills | Status: DC

## 2019-10-28 MED ORDER — ENALAPRIL MALEATE 10 MG PO TABS: 10.0000 mg | ORAL_TABLET | Freq: Every day | ORAL | 3 refills | Status: DC

## 2019-10-28 MED ORDER — NICOTINE POLACRILEX 4 MG MT GUM: 4.0000 mg | CHEWING_GUM | OROMUCOSAL | 0 refills | Status: DC | PRN

## 2019-10-28 MED ORDER — NALTREXONE HCL 50 MG PO TABS: 50.0000 mg | ORAL_TABLET | Freq: Every day | ORAL | 3 refills | Status: DC

## 2019-10-28 MED ORDER — ATORVASTATIN CALCIUM 80 MG PO TABS: 80.0000 mg | ORAL_TABLET | Freq: Every day | ORAL | 2 refills | Status: DC

## 2019-10-28 MED ORDER — AMITRIPTYLINE HCL 100 MG PO TABS: 100.0000 mg | ORAL_TABLET | Freq: Every day | ORAL | 3 refills | Status: DC

## 2019-10-28 MED ORDER — METFORMIN HCL 1000 MG PO TABS: 1000.0000 mg | ORAL_TABLET | Freq: Two times a day (BID) | ORAL | 2 refills | Status: DC

## 2019-10-28 MED ORDER — PANTOPRAZOLE SODIUM 40 MG PO TBEC: 40.0000 mg | DELAYED_RELEASE_TABLET | Freq: Every day | ORAL | 3 refills | Status: DC

## 2019-10-28 NOTE — Assessment & Plan Note (Signed)
Has decreased smoking to half pack per day. Continues to use nicotine patches which is working well. Also sees integrated behavior health clinician through our clinic. Plan: continue current management.

## 2019-10-28 NOTE — Assessment & Plan Note (Signed)
Patient notes good control. Only uses inhaler a few times a week. Can cut down smoking to half pack per day. Continue current management.

## 2019-10-28 NOTE — BH Specialist Note (Signed)
Integrated Behavioral Health Follow Up Visit  MRN: [id number] Name: Ruben Pierce  Number of Integrated Behavioral Health Clinician visits: 3/6 Session Start time: 9:55  Session End time: 10:25 Total time: 30 minutes  Type of Service: Integrated Behavioral Health- Individual Interpretor:No.   SUBJECTIVE: Ruben Pierce is a 54 y.o. male accompanied by his sister in law, guardian. Patient was referred by Dr. Ephriam Knuckles for smoking cessation. Patient reports the following symptoms/concerns: negative coping skills, mild periods of sadness, and challenges with memory.  Duration of problem: increased over the past two years; Severity of problem: mild  OBJECTIVE: Mood: Netural and Affect: Appropriate Risk of harm to self or others: No plan to harm self or others  LIFE CONTEXT: Family and Social: Patient has moved back in with his sister-n-law (whom is his legal guardian).  Self-Care: Needs improvement. Patient has decreased alcohol use and is continuing to smoke (goal to reduce).  Life Changes: Patient has moved over the past two weeks.   GOALS ADDRESSED: Patient will: 1.  Reduce symptoms of: depression, stress and negative coping skills.  2.  Increase knowledge and/or ability of: coping skills, healthy habits and stress reduction  3.  Demonstrate ability to: Increase adequate support systems for patient/family, Increase motivation to adhere to plan of care, Improve medication compliance and Decrease self-medicating behaviors  INTERVENTIONS: Interventions utilized:  Motivational Interviewing, Brief CBT and Supportive Counseling Standardized Assessments completed: assessed for SI, HI, and self-harm.  ASSESSMENT: Patient currently experiencing mild to moderate levels of difficulty implementing healthy coping skills on a regular basis. Patient has continued to smoke around 1 pack of cigarettes every two days. Patient is in the preparation stage of change in regards to reducing his smoking.  Patient has made significant improvements in reducing his alcohol intake. Patient has went entire days without consuming alcohol. Patient's guardian is monitoring the patient's alcohol intake. Patient's mood and self-care has improved.   Patient may benefit from counseling.  PLAN: 1. Follow up with behavioral health clinician on : three weeks.   Lysle Rubens, [facility], LCAS

## 2019-10-28 NOTE — Assessment & Plan Note (Addendum)
He has significantly decreased his alcohol intake since moving back in with his brother and sister in law. Has gone several days without drinking and has no signs of withdrawal. He is eating better and feeling better in general. Continues to take naltrexone which he reports is working well. Sees integrated behavior health through our clinic. Plan: will obtain a cmp today since his LFTs were mildly elevated last time. Continue naltrexone. Continue therapy.

## 2019-10-28 NOTE — Assessment & Plan Note (Signed)
Uses 100mg  amitriptyline at night for sleep. Patient notes that this is working well for him and improves him functionally during the day. Plan: continue current management.

## 2019-10-28 NOTE — Assessment & Plan Note (Addendum)
Current medications: enalapril 10mg  daily. BP noted to be well controlled today in office Plan: continue current management. F/u 53mo

## 2019-10-28 NOTE — Assessment & Plan Note (Addendum)
Currently on lipitor 40mg . Denies side effects. ASCVD 19%.  Plan: will increase to 80mg  lipitor. Obtaining lipid panel today.

## 2019-10-28 NOTE — Patient Instructions (Signed)
It was so nice seeing you again today and I'm very happy to see you are on the right track with things. I will call you to let you know if we need to change anything but for the meantime, just keep up the hard work, and I will see you in 6 months.

## 2019-10-28 NOTE — Progress Notes (Signed)
   CC: hypertension, diabetes, alcohol and tobacco use disorder  HPI:  [name] is a 54 y.o. male who presents for f/u on his chronic medical conditions including hypertension, diabetes, tobacco and alcohol use disorder, and insomnia.  Please see problem based assessment and plan for additional details.     Past Medical History:  Diagnosis Date  . Asthma   . ETOH abuse   . Hypertension   . TBI (traumatic brain injury) (HCC)   . Type 2 diabetes mellitus, uncontrolled (HCC) 2010    Review of Systems:  Review of Systems - General ROS: negative for - chills or fatigue Psychological ROS: negative for - anxiety or depression Respiratory ROS: no cough, shortness of breath, or wheezing Cardiovascular ROS: no chest pain or dyspnea on exertion Gastrointestinal ROS: no abdominal pain, change in bowel habits, or black or bloody stools   Physical Exam:  Vitals:   10/28/19 0919  BP: 116/68  Pulse: 100  Temp: 97.8 F (36.6 C)  TempSrc: Oral  SpO2: 100%  Weight: 169 lb 1.6 oz (76.7 kg)  Height: 6\' 1"  (1.854 m)    GENERAL: well appearing, in no apparent distress CARDIAC: heart regular rate and rhythm PULMONARY: mild expiratory wheezes throughout   Assessment & Plan:   1. Type 2 Diabetes Mellitus: Current medications: metformin 1000mg  bid 2nd prevention: lipitor 40mg  daily Foot exam performed today  Last eye exam 11/2018 Denies hypoglycemic events. Plan: will recheck A1C today. continue current management. Will increase lipitor to 80mg  for secondary prevention. Will be due for eye exam 11/2019. F/u 62mo  2. Essential Hypertension: Current medications: enalapril 10mg  daily. BP noted to be well controlled today in office Plan: continue current management. F/u 91mo  3. Insomnia: Uses 100mg  amitriptyline at night for sleep. Patient notes that this is working well for him and improves him functionally during the day. Plan: continue current management.  4. Alcohol use  disorder: He has significantly decreased his alcohol intake since moving back in with his brother and sister in law. Has gone several days without drinking and has no signs of withdrawal. He is eating better and feeling better in general. Continues to take naltrexone which he reports is working well. Sees integrated behavior health through our clinic. Plan: will obtain a cmp today since his LFTs were mildly elevated last time. Continue naltrexone. Continue therapy.  5. Tobacco use disorder. Has decreased smoking to half pack per day. Continues to use nicotine patches which is working well. Also sees integrated behavior health clinician through our clinic. Plan: continue current management.  6. Healthcare maintenance: due for colonoscopy. Resent referral to GI for this.  Patient is in agreement with the plan and endorses no further questions at this time.  Patient discussed with Dr. , MD Internal Medicine Resident-PGY1 10/28/19

## 2019-10-28 NOTE — Assessment & Plan Note (Addendum)
Current medications: metformin 1000mg  bid 2nd prevention: lipitor 40mg  daily Foot exam performed today  Last eye exam 11/2018 Plan: will recheck A1C today. continue current management. Will increase lipitor to 80mg  for secondary prevention. Will be due for eye exam 11/2019. F/u 21mo

## 2019-10-29 ENCOUNTER — Encounter: Payer: Self-pay | Admitting: Gastroenterology

## 2019-10-29 LAB — CMP14 + ANION GAP
ALT: 25 IU/L (ref 0–44)
AST: 20 IU/L (ref 0–40)
Albumin/Globulin Ratio: 1.9 (ref 1.2–2.2)
Albumin: 4.5 g/dL (ref 3.8–4.9)
Alkaline Phosphatase: 109 IU/L (ref 39–117)
Anion Gap: 16 mmol/L (ref 10.0–18.0)
BUN/Creatinine Ratio: 10 (ref 9–20)
BUN: 10 mg/dL (ref 6–24)
Bilirubin Total: <0.2 mg/dL (ref 0.0–1.2)
CO2: 25 mmol/L (ref 20–29)
Calcium: 9.7 mg/dL (ref 8.7–10.2)
Chloride: 96 mmol/L (ref 96–106)
Creatinine, Ser: 1 mg/dL (ref 0.76–1.27)
GFR calc Af Amer: 100 mL/min/{1.73_m2} (ref >59)
GFR calc non Af Amer: 86 mL/min/{1.73_m2} (ref >59)
Globulin, Total: 2.4 g/dL (ref 1.5–4.5)
Glucose: 89 mg/dL (ref 65–99)
Potassium: 4.8 mmol/L (ref 3.5–5.2)
Sodium: 137 mmol/L (ref 134–144)
Total Protein: 6.9 g/dL (ref 6.0–8.5)

## 2019-10-29 LAB — LIPID PANEL
Chol/HDL Ratio: 2.4 ratio (ref 0.0–5.0)
Cholesterol, Total: 153 mg/dL (ref 100–199)
HDL: 65 mg/dL (ref >39)
LDL Chol Calc (NIH): 52 mg/dL (ref 0–99)
Triglycerides: 229 mg/dL — ABNORMAL HIGH (ref 0–149)
VLDL Cholesterol Cal: 36 mg/dL (ref 5–40)

## 2019-10-29 NOTE — Progress Notes (Signed)
Please let Mr. Austad know that his labs look good. Follow up 101mo for dm recheck.

## 2019-10-30 NOTE — Progress Notes (Signed)
Internal Medicine Clinic Attending  Case discussed with Dr. Christian at the time of the visit.  We reviewed the resident's history and exam and pertinent patient test results.  I agree with the assessment, diagnosis, and plan of care documented in the resident's note.    

## 2019-10-31 ENCOUNTER — Telehealth: Payer: Self-pay | Admitting: *Deleted

## 2019-10-31 NOTE — Telephone Encounter (Signed)
Called pt - no answer; left message to call the office . 

## 2019-10-31 NOTE — Telephone Encounter (Signed)
-----   Message from Elige Radon, MD sent at 10/29/2019  5:25 PM EST ----- Please let Mr. Glick know that his labs look good. Follow up 67mo for dm recheck.

## 2019-11-04 NOTE — Telephone Encounter (Signed)
Called pt again - no answer; left message to call the office. 

## 2019-11-13 ENCOUNTER — Ambulatory Visit (AMBULATORY_SURGERY_CENTER): Payer: Self-pay | Admitting: *Deleted

## 2019-11-13 ENCOUNTER — Other Ambulatory Visit: Payer: Self-pay

## 2019-11-13 VITALS — Temp 97.5°F | Ht 73.0 in | Wt 172.0 lb

## 2019-11-13 DIAGNOSIS — Z1211 Encounter for screening for malignant neoplasm of colon: Secondary | ICD-10-CM

## 2019-11-13 DIAGNOSIS — Z01818 Encounter for other preprocedural examination: Secondary | ICD-10-CM

## 2019-11-13 MED ORDER — NA SULFATE-K SULFATE-MG SULF 17.5-3.13-1.6 GM/177ML PO SOLN: 1.0000 | Freq: Once | ORAL | 0 refills | Status: AC

## 2019-11-13 NOTE — Progress Notes (Signed)

## 2019-11-18 ENCOUNTER — Other Ambulatory Visit: Payer: Self-pay | Admitting: Internal Medicine

## 2019-11-24 ENCOUNTER — Other Ambulatory Visit: Payer: Self-pay | Admitting: Gastroenterology

## 2019-11-24 ENCOUNTER — Ambulatory Visit (INDEPENDENT_AMBULATORY_CARE_PROVIDER_SITE_OTHER): Payer: Medicaid Other

## 2019-11-24 DIAGNOSIS — Z1159 Encounter for screening for other viral diseases: Secondary | ICD-10-CM

## 2019-11-25 ENCOUNTER — Telehealth: Payer: Self-pay | Admitting: Licensed Clinical Social Worker

## 2019-11-25 ENCOUNTER — Ambulatory Visit: Payer: Medicaid Other | Admitting: Licensed Clinical Social Worker

## 2019-11-25 LAB — SARS CORONAVIRUS 2 (TAT 6-24 HRS): SARS Coronavirus 2: NEGATIVE

## 2019-11-25 NOTE — Telephone Encounter (Signed)
Patient was called for his scheduled visit. Patient did not answer, and a vm was left for the patient.  

## 2019-11-27 ENCOUNTER — Ambulatory Visit (AMBULATORY_SURGERY_CENTER): Payer: Medicaid Other | Admitting: Gastroenterology

## 2019-11-27 ENCOUNTER — Encounter: Payer: Self-pay | Admitting: Gastroenterology

## 2019-11-27 ENCOUNTER — Other Ambulatory Visit: Payer: Self-pay

## 2019-11-27 VITALS — BP 155/95 | HR 77 | Temp 97.3°F | Resp 13 | Ht 73.0 in | Wt 172.0 lb

## 2019-11-27 DIAGNOSIS — Z1211 Encounter for screening for malignant neoplasm of colon: Secondary | ICD-10-CM | POA: Diagnosis not present

## 2019-11-27 DIAGNOSIS — Z538 Procedure and treatment not carried out for other reasons: Secondary | ICD-10-CM

## 2019-11-27 MED ORDER — SODIUM CHLORIDE 0.9 % IV SOLN: 500.0000 mL | Freq: Once | INTRAVENOUS | Status: DC

## 2019-11-27 NOTE — Patient Instructions (Signed)
Poor prep, Reschedule colonoscopy in 6 months for screening purposes. Do a 2 day prep and please pay close attention to pre-procedure to dietary instructions.   YOU HAD AN ENDOSCOPIC PROCEDURE TODAY AT THE [facility]:   Refer to the procedure report that was given to you for any specific questions about what was found during the examination.  If the procedure report does not answer your questions, please call your gastroenterologist to clarify.  If you requested that your care partner not be given the details of your procedure findings, then the procedure report has been included in a sealed envelope for you to review at your convenience later.  YOU SHOULD EXPECT: Some feelings of bloating in the abdomen. Passage of more gas than usual.  Walking can help get rid of the air that was put into your GI tract during the procedure and reduce the bloating. If you had a lower endoscopy (such as a colonoscopy or flexible sigmoidoscopy) you may notice spotting of blood in your stool or on the toilet paper. If you underwent a bowel prep for your procedure, you may not have a normal bowel movement for a few days.  Please Note:  You might notice some irritation and congestion in your nose or some drainage.  This is from the oxygen used during your procedure.  There is no need for concern and it should clear up in a day or so.  SYMPTOMS TO REPORT IMMEDIATELY:   Following lower endoscopy (colonoscopy or flexible sigmoidoscopy):  Excessive amounts of blood in the stool  Significant tenderness or worsening of abdominal pains  Swelling of the abdomen that is new, acute  Fever of 100F or higher   For urgent or emergent issues, a gastroenterologist can be reached at any hour by calling [phone].   DIET:  We do recommend a small meal at first, but then you may proceed to your regular diet.  Drink plenty of fluids but you should avoid alcoholic beverages for 24 hours.  ACTIVITY:  You should  plan to take it easy for the rest of today and you should NOT DRIVE or use heavy machinery until tomorrow (because of the sedation medicines used during the test).    FOLLOW UP: Our staff will call the number listed on your records 48-72 hours following your procedure to check on you and address any questions or concerns that you may have regarding the information given to you following your procedure. If we do not reach you, we will leave a message.  We will attempt to reach you two times.  During this call, we will ask if you have developed any symptoms of COVID 19. If you develop any symptoms (ie: fever, flu-like symptoms, shortness of breath, cough etc.) before then, please call [phone].  If you test positive for Covid 19 in the 2 weeks post procedure, please call and report this information to [place].    If any biopsies were taken you will be contacted by phone or by letter within the next 1-3 weeks.  Please call us at [phone] if you have not heard about the biopsies in 3 weeks.    SIGNATURES/CONFIDENTIALITY: You and/or your care partner have signed paperwork which will be entered into your electronic medical record.  These signatures attest to the fact that that the information above on your After Visit Summary has been reviewed and is understood.  Full responsibility of the confidentiality of this discharge information lies with you and/or your care-partner.

## 2019-11-27 NOTE — Progress Notes (Signed)
PT taken to PACU. Monitors in place. VSS. Report given to RN. 

## 2019-11-27 NOTE — Progress Notes (Signed)
Temp- JB Vitals- DT 

## 2019-11-27 NOTE — Progress Notes (Signed)
Temp JB Vitals CW 

## 2019-11-27 NOTE — Op Note (Signed)
[facility] Patient Name: Ruben Pierce Procedure Date: 11/27/2019 9:59 AM MRN: [id number] Endoscopist: Sherilyn Cooter L. Myrtie Neither , MD Age: 54 Referring MD:  Date of Birth: 03-11-1966 Gender: Male Account #: [id number] Procedure:                Colonoscopy Indications:              Screening for colorectal malignant neoplasm, This                            is the patient's first colonoscopy Medicines:                Monitored Anesthesia Care Procedure:                Pre-Anesthesia Assessment:                           - Prior to the procedure, a History and Physical                            was performed, and patient medications and                            allergies were reviewed. The patient's tolerance of                            previous anesthesia was also reviewed. The risks                            and benefits of the procedure and the sedation                            options and risks were discussed with the patient.                            All questions were answered, and informed consent                            was obtained. Prior Anticoagulants: The patient has                            taken no previous anticoagulant or antiplatelet                            agents. ASA Grade Assessment: II - A patient with                            mild systemic disease. After reviewing the risks                            and benefits, the patient was deemed in                            satisfactory condition to undergo the procedure.  After obtaining informed consent, the colonoscope                            was passed under direct vision. Throughout the                            procedure, the patient's blood pressure, pulse, and                            oxygen saturations were monitored continuously. The                            Colonoscope was introduced through the anus and                            advanced to the the cecum,  identified by                            appendiceal orifice and ileocecal valve. The                            colonoscopy was performed without difficulty. The                            patient tolerated the procedure well. The quality                            of the bowel preparation was poor. The ileocecal                            valve, appendiceal orifice, and rectum were                            photographed. The quality of the bowel preparation                            was evaluated using the BBPS [facility] Bowel                            Preparation Scale) with scores of: Right Colon = 0,                            Transverse Colon = 1 and Left Colon = 0. The total                            BBPS score equals 1. The bowel preparation used was                            SUPREP. Scope In: 10:11:29 AM Scope Out: 10:22:14 AM Scope Withdrawal Time: 0 hours 7 minutes 41 seconds  Total Procedure Duration: 0 hours 10 minutes 45 seconds  Findings:                 The perianal and digital rectal  examinations were                            normal.                           A large amount of liquid solid stool was found in                            the entire colon, precluding visualization. Lavage                            of the area was performed, resulting in incomplete                            clearance with continued poor visualization.                           The exam was otherwise without abnormality on                            direct and retroflexion views (given limited                            visualization from poor prep). Complications:            No immediate complications. Estimated Blood Loss:     Estimated blood loss: none. Impression:               - Preparation of the colon was poor.                           - Stool in the entire examined colon.                           - The examination was otherwise normal on direct                            and  retroflexion views.                           - No specimens collected. Recommendation:           - Patient has a contact number available for                            emergencies. The signs and symptoms of potential                            delayed complications were discussed with the                            patient. Return to normal activities tomorrow.                            Written discharge instructions were provided to the  patient.                           - Resume previous diet.                           - Continue present medications.                           - Repeat colonoscopy within 6 months for screening                            purposes. 2-DAY PREP and closer attention to                            pre-procedure dietary instructions. Traycen Goyer L. Myrtie Neither, MD 11/27/2019 10:26:44 AM This report has been signed electronically.

## 2019-12-01 ENCOUNTER — Telehealth: Payer: Self-pay | Admitting: *Deleted

## 2019-12-01 NOTE — Telephone Encounter (Signed)
Follow up call made, no answer. Left message

## 2019-12-01 NOTE — Telephone Encounter (Signed)
  Follow up Call-  Call back number 11/27/2019  Post procedure Call Back phone  # [phone]  Permission to leave phone message Yes  Some recent data might be hidden     Patient questions:  Message left message to call us if necessary.

## 2020-01-16 ENCOUNTER — Ambulatory Visit: Payer: Medicaid Other

## 2020-01-23 ENCOUNTER — Ambulatory Visit: Payer: Medicaid Other | Admitting: Internal Medicine

## 2020-01-23 ENCOUNTER — Encounter: Payer: Self-pay | Admitting: Internal Medicine

## 2020-01-23 ENCOUNTER — Other Ambulatory Visit: Payer: Self-pay

## 2020-01-23 VITALS — BP 93/73 | HR 112 | Temp 98.0°F | Ht 73.0 in | Wt 184.5 lb

## 2020-01-23 DIAGNOSIS — E118 Type 2 diabetes mellitus with unspecified complications: Secondary | ICD-10-CM

## 2020-01-23 DIAGNOSIS — E1159 Type 2 diabetes mellitus with other circulatory complications: Secondary | ICD-10-CM | POA: Diagnosis not present

## 2020-01-23 DIAGNOSIS — I10 Essential (primary) hypertension: Secondary | ICD-10-CM

## 2020-01-23 DIAGNOSIS — Z794 Long term (current) use of insulin: Secondary | ICD-10-CM

## 2020-01-23 DIAGNOSIS — E785 Hyperlipidemia, unspecified: Secondary | ICD-10-CM | POA: Diagnosis not present

## 2020-01-23 DIAGNOSIS — E1169 Type 2 diabetes mellitus with other specified complication: Secondary | ICD-10-CM | POA: Diagnosis not present

## 2020-01-23 DIAGNOSIS — R109 Unspecified abdominal pain: Secondary | ICD-10-CM | POA: Insufficient documentation

## 2020-01-23 DIAGNOSIS — L602 Onychogryphosis: Secondary | ICD-10-CM

## 2020-01-23 DIAGNOSIS — Z7984 Long term (current) use of oral hypoglycemic drugs: Secondary | ICD-10-CM | POA: Diagnosis not present

## 2020-01-23 DIAGNOSIS — Z79899 Other long term (current) drug therapy: Secondary | ICD-10-CM

## 2020-01-23 DIAGNOSIS — I152 Hypertension secondary to endocrine disorders: Secondary | ICD-10-CM

## 2020-01-23 DIAGNOSIS — E119 Type 2 diabetes mellitus without complications: Secondary | ICD-10-CM

## 2020-01-23 LAB — POCT GLYCOSYLATED HEMOGLOBIN (HGB A1C): Hemoglobin A1C: 5.8 % — AB (ref 4.0–5.6)

## 2020-01-23 LAB — GLUCOSE, CAPILLARY: Glucose-Capillary: 174 mg/dL — ABNORMAL HIGH (ref 70–99)

## 2020-01-23 MED ORDER — METFORMIN HCL 1000 MG PO TABS: 1000.0000 mg | ORAL_TABLET | Freq: Two times a day (BID) | ORAL | 0 refills | Status: DC

## 2020-01-23 NOTE — Progress Notes (Signed)
   CC: diabetes follow-up  HPI:  [name] is a 54 y.o. M with significant PMH as outlined below, who presents for diabetes follow-up. Please see problem-based charting for additional information on pt's chronic medical conditions.   Past Medical History:  Diagnosis Date  . Asthma   . ETOH abuse   . Hypertension   . TBI (traumatic brain injury) (HCC)   . Type 2 diabetes mellitus, uncontrolled (HCC) 2010   Review of Systems:   Review of Systems  Constitutional: Negative for chills and fever.  Eyes: Negative for blurred vision.  Respiratory: Negative for cough and shortness of breath.   Cardiovascular: Negative for chest pain and palpitations.  Gastrointestinal: Positive for abdominal pain (intermittent R sided abdominal pain). Negative for nausea and vomiting.  Genitourinary: Negative for dysuria and frequency.  Neurological: Negative for dizziness, weakness and headaches.   Physical Exam:  Vitals:   01/23/20 0939  BP: 93/73  Temp: 98 F (36.7 C)  TempSrc: Oral  SpO2: 99%  Weight: 184 lb 8 oz (83.7 kg)  Height: 6\' 1"  (1.854 m)   Physical Exam Vitals and nursing note reviewed.  Constitutional:      General: He is not in acute distress.    Appearance: Normal appearance. He is not ill-appearing.  Cardiovascular:     Rate and Rhythm: Normal rate and regular rhythm.     Pulses:          Dorsalis pedis pulses are 2+ on the right side and 2+ on the left side.     Heart sounds: Normal heart sounds.     Comments: No tenderness to palpation on R side of chest Pulmonary:     Effort: Pulmonary effort is normal. No respiratory distress.     Breath sounds: Normal breath sounds. No wheezing, rhonchi or rales.  Abdominal:     General: Abdomen is flat. Bowel sounds are normal. There is no distension.     Palpations: Abdomen is soft.     Tenderness: There is no abdominal tenderness. There is no guarding or rebound.  Musculoskeletal:     Comments: Full ROM with  flexion/extension and rotation of the back without triggering pain/cramping. Bilateral foot examination remarkable for hypertrophic toenails.  Skin:    General: Skin is warm and dry.     Comments: No areas of ulcerations or wounds on bilateral feet.  Neurological:     Mental Status: He is alert.    Assessment & Plan:   See Encounters Tab for problem based charting.  Patient discussed with Dr. 

## 2020-01-23 NOTE — Patient Instructions (Addendum)
Ruben Pierce,  It was nice meeting you! Today we discussed....  Diabetes - Your A1c looks great, so keep up the good work! Continue to take metformin twice a day, and refills have been sent into your pharmacy. Referrals to the eye doctor and foot doctor have been placed. Their offices will be calling you to schedule an appointment.  Right sided cramping - This is likely a muscle spasm in that area that will get better over time. Please limit heavy lifting or twisting on that side as that can aggravate your pain.  I have reviewed your medications in the office today. Please continue to take them as prescribed on the labels. Use your pill pack and ask [name] at home to help you remember to take your medications.  Please follow-up with your primary doctor in 3 months.  Thank you for letting us be a part of your care!

## 2020-02-02 ENCOUNTER — Encounter: Payer: Self-pay | Admitting: *Deleted

## 2020-02-05 NOTE — Assessment & Plan Note (Signed)
Pt requesting a podiatry referral for hypertrophic toenails, particularly the second right toe. Has been referred in the past, but says that he had never be contacted. On exam, pt has hypertrophic toenails bilaterally. Good DP pulses bilaterally. Right and left feet without ulcerations or wounds.  - podiatry referral

## 2020-02-05 NOTE — Assessment & Plan Note (Signed)
Pt states he has been having a few week history of intermittent right sided abdominal pain/cramping. The pain comes on suddenly and resolved within seconds to minutes. Happens up to once or twice a week. Pt says it "feels like a pulled muscle" as pain is triggered with heavy lifting or certain twisting motions. Pain is not associated with meals. No nausea/vomiting, heartburn, flank pain, dysuria, hematuria, or shortness of breath. Pt does have history of alcohol use disorder and elevated LFTs. Last CMP in Jan 2021 was entirely within normal limits.  Pt's history is consistent with abdominal muscle spasm vs strain given intermittent, quickly self resolves, and is triggered by certain movements. However, right sided pain in this patient could be liver pathology given EtOH use history. Exam reassuring at this time without tenderness to palpation of RUQ or R lower chest. Pt has full ROM in rotation and flexion/extension of the back without pain or cramping on the right side.  - continue to monitor - advised pt to limit provoking movements (heavy lifting or twisting) - given strict return precautions should the pain worsen, not resolve, or change in character   CMP Latest Ref Rng & Units 10/28/2019 09/06/2019 06/05/2019  Glucose 65 - 99 mg/dL 89 85 650(P)  BUN 6 - 24 mg/dL 10 6 9   Creatinine 0.76 - 1.27 mg/dL 5.46 5.68  Sodium 134 - 144 mmol/L 137 136 135  Potassium 3.5 - 5.2 mmol/L 4.8 3.8 4.2  Chloride 96 - 106 mmol/L 96 101 96  CO2 20 - 29 mmol/L 25 23 16(L)  Calcium 8.7 - 10.2 mg/dL 9.7 9.3 9.7  Total Protein 6.0 - 8.5 g/dL 6.9 7.8 7.2  Total Bilirubin 0.0 - 1.2 mg/dL 1.27 0.5 0.7  Alkaline Phos 39 - 117 IU/L 109 109 116  AST 0 - 40 IU/L 20 44(H) 39  ALT 0 - 44 IU/L 25 46(H) 31

## 2020-02-05 NOTE — Assessment & Plan Note (Signed)
Pt currently taking enalapril 10mg  daily. BP today very well controlled at 93/73. Pt states he is feeling at his baseline today. Denies headaches, dizziness/lightheadedness, chest pain, shortness of breath, cough, or medication side effects. Per chart review, pt's BP was elevated to 155/95 in February at his colonoscopy procedure.  - continue enalapril at this time - will continue to monitor BP  BP Readings from Last 3 Encounters:  01/23/20 93/73  11/27/19 (!) 155/95  10/28/19 116/68

## 2020-02-05 NOTE — Assessment & Plan Note (Signed)
Pt's atorvastatin increased at last PCP visit to 80mg . He has all his medications in the clinic today, and upon review has both atorvastatin 40mg  and 80mg  bottles. Does endorse that he has been taking one of each pill daily. Instructed pt to only take one 80mg  tablet daily. He expressed understanding and the 40mg  tablets were given to clinic nursing staff for disposal.

## 2020-02-05 NOTE — Assessment & Plan Note (Signed)
Pt presents today for diabetes follow-up. Pt feeling well. Denies polyuria, polydipsia, nausea/vomiting, vision changes, chest pain, or shortness of breath. Endorses taking metformin 1000mg  BID. Has empty medication bottle in the exam room, and states he ran out a few days ago. Denies diarrhea or any medication side effects. A1c remains well controlled today at 5.8 (previous 5.5 in Jan 2021). CBG 174. Pt requests ophthalmology referral today. Per chart review, pt last seen by Dr. Feb 2021 on 11/19/2017. There was no evidence of diabetic retinopathy at that time.   - metformin refills - ophthalmology referral - PCP follow-up in 3 months  Lab Results  Component Value Date   HGBA1C 5.8 (A) 01/23/2020

## 2020-02-14 ENCOUNTER — Other Ambulatory Visit: Payer: Self-pay | Admitting: Internal Medicine

## 2020-02-14 DIAGNOSIS — J4540 Moderate persistent asthma, uncomplicated: Secondary | ICD-10-CM

## 2020-03-02 NOTE — Progress Notes (Signed)
Internal Medicine Clinic Attending  Case discussed with Dr. Jones at the time of the visit.  We reviewed the resident's history and exam and pertinent patient test results.  I agree with the assessment, diagnosis, and plan of care documented in the resident's note.  

## 2020-03-03 NOTE — Addendum Note (Signed)
Addended by: Neomia Dear on: 03/03/2020 06:13 PM   Modules accepted: Orders

## 2020-03-23 ENCOUNTER — Ambulatory Visit: Payer: Medicaid Other | Admitting: Podiatry

## 2020-03-23 ENCOUNTER — Other Ambulatory Visit: Payer: Self-pay

## 2020-03-23 ENCOUNTER — Ambulatory Visit (INDEPENDENT_AMBULATORY_CARE_PROVIDER_SITE_OTHER): Payer: Medicaid Other

## 2020-03-23 DIAGNOSIS — G629 Polyneuropathy, unspecified: Secondary | ICD-10-CM

## 2020-03-23 DIAGNOSIS — B351 Tinea unguium: Secondary | ICD-10-CM | POA: Diagnosis not present

## 2020-03-23 DIAGNOSIS — M79674 Pain in right toe(s): Secondary | ICD-10-CM | POA: Diagnosis not present

## 2020-03-23 DIAGNOSIS — M79671 Pain in right foot: Secondary | ICD-10-CM

## 2020-03-23 DIAGNOSIS — M79675 Pain in left toe(s): Secondary | ICD-10-CM

## 2020-03-23 DIAGNOSIS — I739 Peripheral vascular disease, unspecified: Secondary | ICD-10-CM

## 2020-03-23 MED ORDER — DICLOFENAC SODIUM 1 % EX GEL: 2.0000 g | Freq: Four times a day (QID) | CUTANEOUS | 2 refills | Status: DC

## 2020-03-23 NOTE — Patient Instructions (Signed)
Alcohol Abuse and Dependence Information, Adult Alcohol is a widely available drug. People drink alcohol in different amounts. People who drink alcohol very often and in large amounts often have problems during and after drinking. They may develop what is called an alcohol use disorder. There are two main types of alcohol use disorders:  Alcohol abuse. This is when you use alcohol too much or too often. You may use alcohol to make yourself feel happy or to reduce stress. You may have a hard time setting a limit on the amount you drink.  Alcohol dependence. This is when you use alcohol consistently for a period of time, and your body changes as a result. This can make it hard to stop drinking because you may start to feel sick or feel different when you do not use alcohol. These symptoms are known as withdrawal. How can alcohol abuse and dependence affect me? Alcohol abuse and dependence can have a negative effect on your life. Drinking too much can lead to addiction. You may feel like you need alcohol to function normally. You may drink alcohol before work in the morning, during the day, or as soon as you get home from work in the evening. These actions can result in:  Poor work performance.  Job loss.  Financial problems.  Car crashes or criminal charges from driving after drinking alcohol.  Problems in your relationships with friends and family.  Losing the trust and respect of coworkers, friends, and family. Drinking heavily over a long period of time can permanently damage your body and brain, and can cause lifelong health issues, such as:  Damage to your liver or pancreas.  Heart problems, high blood pressure, or stroke.  Certain cancers.  Decreased ability to fight infections.  Brain or nerve damage.  Depression.  Early (premature) death. If you are careless or you crave alcohol, it is easy to drink more than your body can handle (overdose). Alcohol overdose is a serious  situation that requires hospitalization. It may lead to permanent injuries or death. What can increase my risk?  Having a family history of alcohol abuse.  Having depression or other mental health conditions.  Beginning to drink at an early age.  Binge drinking often.  Experiencing trauma, stress, and an unstable home life during childhood.  Spending time with people who drink often. What actions can I take to prevent or manage alcohol abuse and dependence?  Do not drink alcohol if: ? Your health care provider tells you not to drink. ? You are pregnant, may be pregnant, or are planning to become pregnant.  If you drink alcohol: ? Limit how much you use to:  0-1 drink a day for women.  0-2 drinks a day for men. ? Be aware of how much alcohol is in your drink. In the [place] one drink equals one 12 oz bottle of beer (355 mL), one 5 oz glass of wine (148 mL), or one 1 oz glass of hard liquor (44 mL).  Stop drinking if you have been drinking too much. This can be very hard to do if you are used to abusing alcohol. If you begin to have withdrawal symptoms, talk with your health care provider or a person that you trust. These symptoms may include anxiety, shaky hands, headache, nausea, sweating, or not being able to sleep.  Choose to drink nonalcoholic beverages in social gatherings and places where there may be alcohol. Activity  Spend more time on activities that you enjoy that do   not involve alcohol, like hobbies or exercise.  Find healthy ways to cope with stress, such as exercise, meditation, or spending time with people you care about. General information  Talk to your family, coworkers, and friends about supporting you in your efforts to stop drinking. If they drink, ask them not to drink around you. Spend more time with people who do not drink alcohol.  If you think that you have an alcohol dependency problem: ? Tell friends or family about your concerns. ? Talk with your  health care provider or another health professional about where to get help. ? Work with a Paramedic and a Network engineer. ? Consider joining a support group for people who struggle with alcohol abuse and dependence. Where to find support   Your health care provider.  SMART Recovery: www.smartrecovery.org Therapy and support groups  Local treatment centers or chemical dependency counselors.  Local AA groups in your community: SalaryStart.tn Where to find more information  Centers for Disease Control and Prevention: FootballExhibition.com.br  [employer] on Alcohol Abuse and Alcoholism: BasicStudents.dk  Alcoholics Anonymous (AA): SalaryStart.tn Contact a health care provider if:  You drank more or for longer than you intended on more than one occasion.  You tried to stop drinking or to cut back on how much you drink, but you were not able to.  You often drink to the point of vomiting or passing out.  You want to drink so badly that you cannot think about anything else.  You have problems in your life due to drinking, but you continue to drink.  You keep drinking even though you feel anxious, depressed, or have experienced memory loss.  You have stopped doing the things you used to enjoy in order to drink.  You have to drink more than you used to in order to get the effect you want.  You experience anxiety, sweating, nausea, shakiness, and trouble sleeping when you try to stop drinking. Get help right away if:  You have thoughts about hurting yourself or others.  You have serious withdrawal symptoms, including: ? Confusion. ? Racing heart. ? High blood pressure. ? Fever. If you ever feel like you may hurt yourself or others, or have thoughts about taking your own life, get help right away. You can go to your nearest emergency department or call:  Your local emergency services (911 in the [place]  A suicide crisis helpline, such as the National Suicide Prevention  Lifeline at 808-379-4852. This is open 24 hours a day. Summary  Alcohol abuse and dependence can have a negative effect on your life. Drinking too much or too often can lead to addiction.  If you drink alcohol, limit how much you use.  If you are having trouble keeping your drinking under control, find ways to change your behavior. Hobbies, calming activities, exercise, or support groups can help.  If you feel you need help with changing your drinking habits, talk with your health care provider, a good friend, or a therapist, or go to an AA group. This information is not intended to replace advice given to you by your health care provider. Make sure you discuss any questions you have with your health care provider. Document Revised: 01/07/2019 Document Reviewed: 11/26/2018 Elsevier Patient Education  2020 [employer]vier Inc. Neuropathic Pain Neuropathic pain is pain caused by damage to the nerves that are responsible for certain sensations in your body (sensory nerves). The pain can be caused by:  Damage to the sensory nerves that send  signals to your spinal cord and brain (peripheral nervous system).  Damage to the sensory nerves in your brain or spinal cord (central nervous system). Neuropathic pain can make you more sensitive to pain. Even a minor sensation can feel very painful. This is usually a long-term condition that can be difficult to treat. The type of pain differs from person to person. It may:  Start suddenly (acute), or it may develop slowly and last for a long time (chronic).  Come and go as damaged nerves heal, or it may stay at the same level for years.  Cause emotional distress, loss of sleep, and a lower quality of life. What are the causes? The most common cause of this condition is diabetes. Many other diseases and conditions can also cause neuropathic pain. Causes of neuropathic pain can be classified as:  Toxic. This is caused by medicines and chemicals. The most  common cause of toxic neuropathic pain is damage from cancer treatments (chemotherapy).  Metabolic. This can be caused by: ? Diabetes. This is the most common disease that damages the nerves. ? Lack of vitamin B from long-term alcohol abuse.  Traumatic. Any injury that cuts, crushes, or stretches a nerve can cause damage and pain. A common example is feeling pain after losing an arm or leg (phantom limb pain).  Compression-related. If a sensory nerve gets trapped or compressed for a long period of time, the blood supply to the nerve can be cut off.  Vascular. Many blood vessel diseases can cause neuropathic pain by decreasing blood supply and oxygen to nerves.  Autoimmune. This type of pain results from diseases in which the body's defense system (immune system) mistakenly attacks sensory nerves. Examples of autoimmune diseases that can cause neuropathic pain include lupus and multiple sclerosis.  Infectious. Many types of viral infections can damage sensory nerves and cause pain. Shingles infection is a common cause of this type of pain.  Inherited. Neuropathic pain can be a symptom of many diseases that are passed down through families (genetic). What increases the risk? You are more likely to develop this condition if:  You have diabetes.  You smoke.  You drink too much alcohol.  You are taking certain medicines, including medicines that kill cancer cells (chemotherapy) or that treat immune system disorders. What are the signs or symptoms? The main symptom is pain. Neuropathic pain is often described as:  Burning.  Shock-like.  Stinging.  Hot or cold.  Itching. How is this diagnosed? No single test can diagnose neuropathic pain. It is diagnosed based on:  Physical exam and your symptoms. Your health care provider will ask you about your pain. You may be asked to use a pain scale to describe how bad your pain is.  Tests. These may be done to see if you have a high  sensitivity to pain and to help find the cause and location of any sensory nerve damage. They include: ? Nerve conduction studies to test how well nerve signals travel through your sensory nerves (electrodiagnostic testing). ? Stimulating your sensory nerves through electrodes on your skin and measuring the response in your spinal cord and brain (somatosensory evoked potential).  Imaging studies, such as: ? X-rays. ? CT scan. ? MRI. How is this treated? Treatment for neuropathic pain may change over time. You may need to try different treatment options or a combination of treatments. Some options include:  Treating the underlying cause of the neuropathy, such as diabetes, kidney disease, or vitamin deficiencies.  Stopping  medicines that can cause neuropathy, such as chemotherapy.  Medicine to relieve pain. Medicines may include: ? Prescription or over-the-counter pain medicine. ? Anti-seizure medicine. ? Antidepressant medicines. ? Pain-relieving patches that are applied to painful areas of skin. ? A medicine to numb the area (local anesthetic), which can be injected as a nerve block.  Transcutaneous nerve stimulation. This uses electrical currents to block painful nerve signals. The treatment is painless.  Alternative treatments, such as: ? Acupuncture. ? Meditation. ? Massage. ? Physical therapy. ? Pain management programs. ? Counseling. Follow these instructions at home: Medicines   Take over-the-counter and prescription medicines only as told by your health care provider.  Do not drive or use heavy machinery while taking prescription pain medicine.  If you are taking prescription pain medicine, take actions to prevent or treat constipation. Your health care provider may recommend that you: ? Drink enough fluid to keep your urine pale yellow. ? Eat foods that are high in fiber, such as fresh fruits and vegetables, whole grains, and beans. ? Limit foods that are high in  fat and processed sugars, such as fried or sweet foods. ? Take an over-the-counter or prescription medicine for constipation. Lifestyle   Have a good support system at home.  Consider joining a chronic pain support group.  Do not use any products that contain nicotine or tobacco, such as cigarettes and e-cigarettes. If you need help quitting, ask your health care provider.  Do not drink alcohol. General instructions  Learn as much as you can about your condition.  Work closely with all your health care providers to find the treatment plan that works best for you.  Ask your health care provider what activities are safe for you.  Keep all follow-up visits as told by your health care provider. This is important. Contact a health care provider if:  Your pain treatments are not working.  You are having side effects from your medicines.  You are struggling with tiredness (fatigue), mood changes, depression, or anxiety. Summary  Neuropathic pain is pain caused by damage to the nerves that are responsible for certain sensations in your body (sensory nerves).  Neuropathic pain may come and go as damaged nerves heal, or it may stay at the same level for years.  Neuropathic pain is usually a long-term condition that can be difficult to treat. Consider joining a chronic pain support group. This information is not intended to replace advice given to you by your health care provider. Make sure you discuss any questions you have with your health care provider. Document Revised: 01/09/2019 Document Reviewed: 10/05/2017 Elsevier Patient Education  2020 [employer].

## 2020-03-26 ENCOUNTER — Other Ambulatory Visit: Payer: Self-pay | Admitting: Podiatry

## 2020-03-26 DIAGNOSIS — G629 Polyneuropathy, unspecified: Secondary | ICD-10-CM

## 2020-03-28 NOTE — Progress Notes (Signed)
Subjective:   Patient ID: Ruben Pierce, male   DOB: 54 y.o.   MRN: [id number]   HPI 54 year old male presents the office with concerns of burning pain to both his feet with his pain level 8/10.  He states he has been told his neuropathy is currently on medication, Elavil to help him sleep.  He is diabetic and his last A1c was 5.8.  He is also elongated and asking for them to be trimmed today's are thickened and painful.  He denies any claudication symptoms otherwise and denies any open sores.   Review of Systems  All other systems reviewed and are negative.  Past Medical History:  Diagnosis Date  . Asthma   . ETOH abuse   . Hypertension   . TBI (traumatic brain injury) (HCC)   . Type 2 diabetes mellitus, uncontrolled (HCC) 2010    Past Surgical History:  Procedure Laterality Date  . BRAIN SURGERY  1999   Head injury, fell from tree 25 feet  . BUNIONECTOMY Right 2014     Current Outpatient Medications:  .  albuterol (VENTOLIN HFA) 108 (90 Base) MCG/ACT inhaler, INHALE 2 PUFFS INTO THE LUNGS EVERY 6 HOURS AS NEEDED FOR WHEEZING OR SHORTNESS OF BREATH, Disp: 8.5 g, Rfl: 6 .  amitriptyline (ELAVIL) 100 MG tablet, Take 1 tablet (100 mg total) by mouth at bedtime., Disp: 30 tablet, Rfl: 3 .  atorvastatin (LIPITOR) 80 MG tablet, Take 1 tablet (80 mg total) by mouth daily., Disp: 90 tablet, Rfl: 2 .  enalapril (VASOTEC) 10 MG tablet, Take 1 tablet (10 mg total) by mouth daily., Disp: 90 tablet, Rfl: 3 .  Lancets (FREESTYLE) lancets, Use as instructed, Disp: 100 each, Rfl: 12 .  metFORMIN (GLUCOPHAGE) 1000 MG tablet, Take 1 tablet (1,000 mg total) by mouth 2 (two) times daily with a meal., Disp: 180 tablet, Rfl: 0 .  naltrexone (DEPADE) 50 MG tablet, Take 1 tablet (50 mg total) by mouth daily., Disp: 90 tablet, Rfl: 3 .  NICOTINE STEP 2 14 MG/24HR patch, Place 1 patch (14 mg total) onto the skin daily., Disp: 28 patch, Rfl: 2 .  pantoprazole (PROTONIX) 40 MG tablet, Take 1 tablet (40 mg  total) by mouth daily., Disp: 90 tablet, Rfl: 3 .  diclofenac Sodium (VOLTAREN) 1 % GEL, Apply 2 g topically 4 (four) times daily. Rub into affected area of foot 2 to 4 times daily, Disp: 100 g, Rfl: 2 .  nicotine polacrilex (NICORETTE) 4 MG gum, Take 1 each (4 mg total) by mouth as needed for smoking cessation. (Patient not taking: Reported on 11/13/2019), Disp: 100 tablet, Rfl: 0  No Known Allergies       Objective:  Physical Exam  General: AAO x3, NAD  Dermatological: Nails are hypertrophic, dystrophic, brittle, discolored, elongated 10. No surrounding redness or drainage. Tenderness nails 1-5 bilaterally. No open lesions or pre-ulcerative lesions are identified today.  Vascular: Dorsalis Pedis artery and Posterior Tibial artery pedal pulses are 2/4 bilateral with immedate capillary fill time. There is no pain with calf compression, swelling, warmth, erythema.   Neruologic: Sensation decreased with Semmes Weinstein monofilament  Musculoskeletal: No gross boney pedal deformities bilateral. No pain, crepitus, or limitation noted with foot and ankle range of motion bilateral. Muscular strength 5/5 in all groups tested bilateral.      Assessment:   54 year old male with symptomatic neuropathy, onychomycosis     Plan:  -Treatment options discussed including all alternatives, risks, and complications -Etiology of symptoms were discussed -X-rays  obtained and reviewed.  No evidence of acute fracture or stress fracture. -We discussed treatment options for neuropathy.  Given he is already on Elavil and he drinks quite a bit of has not been among other medications to correct him drowsy.  She has Voltaren gel to see if this will be helpful.  ABI was in the office to ensure no circulation issues.  Left was 1.29 right was 1.32 -Nails debrided 10 without complications or bleeding. -Daily foot inspection -Follow-up in 3 months or sooner if any problems arise. In the meantime, encouraged to  call the office with any questions, concerns, change in symptoms.   Celesta Gentile, DPM

## 2020-03-29 LAB — HM DIABETES EYE EXAM

## 2020-03-31 ENCOUNTER — Encounter: Payer: Self-pay | Admitting: *Deleted

## 2020-04-01 DIAGNOSIS — Z419 Encounter for procedure for purposes other than remedying health state, unspecified: Secondary | ICD-10-CM | POA: Diagnosis not present

## 2020-04-22 ENCOUNTER — Other Ambulatory Visit: Payer: Self-pay | Admitting: Internal Medicine

## 2020-04-22 DIAGNOSIS — E785 Hyperlipidemia, unspecified: Secondary | ICD-10-CM

## 2020-05-02 DIAGNOSIS — Z419 Encounter for procedure for purposes other than remedying health state, unspecified: Secondary | ICD-10-CM | POA: Diagnosis not present

## 2020-05-06 ENCOUNTER — Other Ambulatory Visit: Payer: Self-pay | Admitting: Student

## 2020-05-06 ENCOUNTER — Other Ambulatory Visit: Payer: Self-pay | Admitting: Podiatry

## 2020-05-06 ENCOUNTER — Other Ambulatory Visit: Payer: Self-pay | Admitting: Internal Medicine

## 2020-05-06 MED ORDER — NICOTINE 14 MG/24HR TD PT24: MEDICATED_PATCH | TRANSDERMAL | 2 refills | Status: DC

## 2020-05-06 NOTE — Telephone Encounter (Signed)
I will prescribe a refill of his nicotine patches, but please let the patient know what we would like him to wean off of them as well in time.

## 2020-05-13 DIAGNOSIS — H524 Presbyopia: Secondary | ICD-10-CM | POA: Diagnosis not present

## 2020-06-02 DIAGNOSIS — Z419 Encounter for procedure for purposes other than remedying health state, unspecified: Secondary | ICD-10-CM | POA: Diagnosis not present

## 2020-06-13 NOTE — Assessment & Plan Note (Deleted)
Well controlled on metformin 1000mg  bid.  Last A1C was 5.8 in April. Foot exam performed by podiatry on 6/22 UTD on diabetic eye exam Plan --continue currrent management --f/u 38mo for DM recheck

## 2020-06-13 NOTE — Progress Notes (Deleted)
Office Visit   Patient ID: Ruben Pierce, male    DOB: 1966-08-04, 54 y.o.   MRN: [id number]  Subjective:  CC: ***  HPI 54 y.o. presents today for management of his chronic medical conditions.  HYPERTENSION FOLLOW-UP: Current medications: enalapril 10mg  daily Med Adherence: []  Yes    []  No Medication side effects: []  Yes    []  No Adherence with salt restriction: []  Yes    []  No Exercise: Yes []  No []  Home Monitoring?: []  Yes    []  No Monitoring Frequency: []  Yes    []  No Home BP results range: []  Yes    []  No Smoking []  Yes []  No SOB? []  Yes []  No Chest Pain?: []  Yes    []  No Leg swelling?: []  Yes []  No Headaches?: []  Yes    []  No Dizziness? []  Yes    []  No Comments: ***   DIABETES TYPE 2 FOLLOW-UP: Anti-hyperglycemic agents: metformin 1000mg  bid Secondary agents:lipitor 80mg  daily Last A1C: 5.8 A1C today ** Have you taken medication(s) today: *** Med Adherence:  []  Yes    []  No Medication side effects:  []  Yes    []  No  Home Monitoring?  []  Yes    [x]  No Diet Adherence: []  Yes    []  No Exercise: []  Yes    []  No Hypoglycemic episodes?: *** Numbness of the feet? []  Yes    []  No Retinopathy hx? []  Yes    [x]  No Last eye exam: 03/29/20 Foot exam: due Comments: ***         ACTIVE MEDICATIONS   Current Outpatient Medications on File Prior to Visit  Medication Sig Dispense Refill  . albuterol (VENTOLIN HFA) 108 (90 Base) MCG/ACT inhaler INHALE 2 PUFFS INTO THE LUNGS EVERY 6 HOURS AS NEEDED FOR WHEEZING OR SHORTNESS OF BREATH 8.5 g 6  . amitriptyline (ELAVIL) 100 MG tablet Take 1 tablet (100 mg total) by mouth at bedtime. 30 tablet 3  . atorvastatin (LIPITOR) 80 MG tablet Take 1 tablet (80 mg total) by mouth daily. 90 tablet 2  . diclofenac Sodium (VOLTAREN) 1 % GEL Apply 2 g topically 4 (four) times daily. Rub into affected area of foot 2 to 4 times daily 100 g 2  . enalapril (VASOTEC) 10 MG tablet Take 1 tablet (10 mg total) by mouth daily. 90 tablet 3  .  Lancets (FREESTYLE) lancets Use as instructed 100 each 12  . metFORMIN (GLUCOPHAGE) 1000 MG tablet Take 1 tablet (1,000 mg total) by mouth 2 (two) times daily with a meal. 180 tablet 0  . naltrexone (DEPADE) 50 MG tablet Take 1 tablet (50 mg total) by mouth daily. 90 tablet 3  . nicotine (NICOTINE STEP 2) 14 mg/24hr patch Place 1 patch (14 mg total) onto the skin daily. 28 patch 2  . nicotine polacrilex (NICORETTE) 4 MG gum Take 1 each (4 mg total) by mouth as needed for smoking cessation. (Patient not taking: Reported on 11/13/2019) 100 tablet 0  . pantoprazole (PROTONIX) 40 MG tablet Take 1 tablet (40 mg total) by mouth daily. 90 tablet 3   No current facility-administered medications on file prior to visit.    ROS  Review of Systems  Objective:   There were no vitals taken for this visit. Wt Readings from Last 3 Encounters:  01/23/20 184 lb 8 oz (83.7 kg)  11/27/19 172 lb (78 kg)  11/13/19 172 lb (78 kg)   BP Readings from Last 3 Encounters:  01/23/20 93/73  11/27/19 (!) 155/95  10/28/19 116/68   Physical Exam  Health Maintenance:   Health Maintenance  Topic Date Due  . COVID-19 Vaccine (1) Never done  . FOOT EXAM  10/10/2019  . HEMOGLOBIN A1C  04/23/2020  . INFLUENZA VACCINE  05/02/2020  . OPHTHALMOLOGY EXAM  03/29/2021  . TETANUS/TDAP  03/29/2029  . COLONOSCOPY  11/26/2029  . PNEUMOCOCCAL POLYSACCHARIDE VACCINE AGE 38-64 HIGH RISK  Completed  . Hepatitis C Screening  Completed  . HIV Screening  Completed     Assessment & Plan:   Problem List Items Addressed This Visit      Cardiovascular and Mediastinum   Hypertension associated with diabetes (HCC) - Primary (Chronic)     Endocrine   Type 2 diabetes mellitus (HCC) (Chronic)     Other   Tobacco abuse (Chronic)   Alcohol use disorder, moderate, dependence (HCC)        Pt discussed with ***  Elige Radon, MD Internal Medicine Resident PGY-2 [facility] Pager:  [phone] 06/13/2020 8:50 AM

## 2020-06-17 ENCOUNTER — Encounter: Payer: Self-pay | Admitting: Internal Medicine

## 2020-06-17 ENCOUNTER — Other Ambulatory Visit: Payer: Medicaid Other

## 2020-06-17 ENCOUNTER — Other Ambulatory Visit: Payer: Self-pay

## 2020-06-17 ENCOUNTER — Ambulatory Visit (INDEPENDENT_AMBULATORY_CARE_PROVIDER_SITE_OTHER): Payer: Medicaid Other | Admitting: Internal Medicine

## 2020-06-17 VITALS — BP 124/87 | HR 106 | Temp 98.5°F | Ht 73.0 in | Wt 196.5 lb

## 2020-06-17 DIAGNOSIS — E119 Type 2 diabetes mellitus without complications: Secondary | ICD-10-CM

## 2020-06-17 LAB — GLUCOSE, CAPILLARY: Glucose-Capillary: 146 mg/dL — ABNORMAL HIGH (ref 70–99)

## 2020-06-17 LAB — POCT GLYCOSYLATED HEMOGLOBIN (HGB A1C): Hemoglobin A1C: 7.2 % — AB (ref 4.0–5.6)

## 2020-06-21 ENCOUNTER — Other Ambulatory Visit: Payer: Self-pay | Admitting: Podiatry

## 2020-06-24 ENCOUNTER — Other Ambulatory Visit: Payer: Self-pay | Admitting: Internal Medicine

## 2020-06-24 DIAGNOSIS — Z794 Long term (current) use of insulin: Secondary | ICD-10-CM

## 2020-06-27 NOTE — Progress Notes (Signed)
Office Visit   Patient ID: Ruben Pierce, male    DOB: [birth year], 54 y.o.   MRN: [id number]  Subjective:  CC: chronic hypertension, chronic T2DM  HPI 54 y.o. presents today for follow up of his chronic medical conditions.  HYPERTENSION FOLLOW-UP: Current medications: enalapril 10mg  daily  Smoking [x]  Yes []  No SOB? []  Yes [x]  No Chest Pain?: [x]  Yes    []  No Leg swelling?: []  Yes [x]  No Headaches?: []  Yes    [x]  No Dizziness? []  Yes    [x]  No    DIABETES TYPE 2 FOLLOW-UP: Anti-hyperglycemic agents: metformin 1000mg  BID Secondary agents: lipitor 80mg  Last A1C: 5.8 in April 2021 A1C on 9/16 7.2 Have you taken medication(s) today: yes Med Adherence:  [x]  Yes    []  No Medication side effects:  []  Yes    [x]  No  Home Monitoring?  []  Yes    [x]  No Exercise: [x]  Yes    []  No Hypoglycemic episodes?: no Numbness of the feet? []  Yes    [x]  No Last eye exam: 03/29/2020 Last foot exam negative within the past year.   Chest pain: Pt noting increased frequency of exertional chest pain over the past month. Present over the left chest wall. Described as sharp but non-radiating. Denies hand tingling/numbness. Resolves after 10-96min of rest. Denies chest pain in the office today No prior cardiac cath.  Alcohol use disorder Pt notes that he has been free of alcohol use for 8 weeks today. Pt congratulated. He notes that he has not been takign the naltrexone consistently and just takes it "once in a while".     ACTIVE MEDICATIONS   Current Outpatient Medications on File Prior to Visit  Medication Sig Dispense Refill  . albuterol (VENTOLIN HFA) 108 (90 Base) MCG/ACT inhaler INHALE 2 PUFFS INTO THE LUNGS EVERY 6 HOURS AS NEEDED FOR WHEEZING OR SHORTNESS OF BREATH 8.5 g 6  . atorvastatin (LIPITOR) 80 MG tablet Take 1 tablet (80 mg total) by mouth daily. 90 tablet 2  . diclofenac Sodium (VOLTAREN) 1 % GEL Apply 2 g topically 4 (four) times daily. Rub into affected area of foot 2 to 4  times daily 100 g 2  . Lancets (FREESTYLE) lancets Use as instructed 100 each 12  . metFORMIN (GLUCOPHAGE) 1000 MG tablet Take 1 tablet (1,000 mg total) by mouth 2 (two) times daily with a meal. 180 tablet 2  . naltrexone (DEPADE) 50 MG tablet Take 1 tablet (50 mg total) by mouth daily. 90 tablet 3  . pantoprazole (PROTONIX) 40 MG tablet Take 1 tablet (40 mg total) by mouth daily. 90 tablet 3   No current facility-administered medications on file prior to visit.    ROS  Review of Systems  Constitutional: Positive for fever. Negative for chills.  Respiratory: Positive for cough, shortness of breath and wheezing.   Cardiovascular: Positive for chest pain. Negative for palpitations and leg swelling.  Neurological: Negative for dizziness, light-headedness and headaches.    Objective:   BP 140/83 (BP Location: Left Arm, Patient Position: Sitting, Cuff Size: Normal) Comment (BP Location): lef  Pulse 94   Temp 98.2 F (36.8 C) (Oral)   Wt 196 lb (88.9 kg)   SpO2 99%   BMI 25.86 kg/m  Wt Readings from Last 3 Encounters:  06/28/20 196 lb (88.9 kg)  06/17/20 196 lb 8 oz (89.1 kg)  01/23/20 184 lb 8 oz (83.7 kg)   BP Readings from Last 3 Encounters:  06/28/20 140/83  06/17/20  124/87  01/23/20 93/73   Physical Exam Constitutional:      Appearance: Normal appearance.  Cardiovascular:     Rate and Rhythm: Normal rate and regular rhythm.     Comments: No chest wall tenderness No JVD Pulmonary:     Comments: Breathing comfortably on room air. Expiratory wheezes present throughtout Musculoskeletal:     Right lower leg: No edema.     Left lower leg: No edema.     Health Maintenance:   Health Maintenance  Topic Date Due  . COVID-19 Vaccine (1) Never done  . HEMOGLOBIN A1C  09/16/2020  . FOOT EXAM  03/23/2021  . OPHTHALMOLOGY EXAM  03/29/2021  . TETANUS/TDAP  03/29/2029  . COLONOSCOPY  11/26/2029  . INFLUENZA VACCINE  Completed  . PNEUMOCOCCAL POLYSACCHARIDE VACCINE AGE  47-64 HIGH RISK  Completed  . Hepatitis C Screening  Completed  . HIV Screening  Completed     Assessment & Plan:   Problem List Items Addressed This Visit      Cardiovascular and Mediastinum   Hypertension associated with diabetes (HCC) (Chronic)    Blood pressure is above goal in the clinic today. Pt did not know he was supposed to be taking enalapril. Discussed the importance of blood pressure control in the setting of diabetes. Moderate physical activity. BMP today shows normal renal function. Potassium noted to be 5.3. Plan --resume enalapril 10mg  daily --lab recheck in 3-5d --f/u in 3 mo      Relevant Medications   enalapril (VASOTEC) 10 MG tablet   nitroGLYCERIN (NITROSTAT) 0.4 MG SL tablet   aspirin EC 325 MG tablet   Other Relevant Orders   CMP14 + Anion Gap (Completed)   Stable angina (HCC) - Primary    Pt noting increased frequency of exertional chest pain over the past month. Present over the left chest wall. Described as sharp but non-radiating. Denies hand tingling/numbness. Resolves after 10-67min of rest. Denies chest pain in the office today No prior cardiac cath. EKG in the office today without ischemic changes but does show some possible LVH. INTERCHEST score 3--43% risk of CAD Risk factors: hypertension, diabetes, tobacco use Mitigating factors: on atorvastatin 80mg   Plan --referral placed to cardiology for ischemic workup --start aspirin 325mg  daily --start nitroglycerin 0.4mg  sublingual tabs prn for chest pain. If not relieved after 2 tablets, seek further evaluation immediately --continue lipitor --strict return precautions discussed.      Relevant Medications   amitriptyline (ELAVIL) 100 MG tablet   enalapril (VASOTEC) 10 MG tablet   nitroGLYCERIN (NITROSTAT) 0.4 MG SL tablet   aspirin EC 325 MG tablet   Other Relevant Orders   EKG 12-Lead (Completed)   Ambulatory referral to Cardiology     Respiratory   Obstructive lung disease (HCC)     Only has a rescue inhaler at this time. Pt notes that he has coughing and shortness of breath with activity. Frequent night time awakings with coughing.  Unclear if he has asthma or COPD--I would favor COPD as being more likely given his smoking history. No prior PFTs. He has tried his sister's trelegy which works well for him Plan --trelegy sent to pharmacy --PFTs to clarify lung process --he will work on tobacco cessation in a few months as he just stopped drinking about 8w ago.        Endocrine   Type 2 diabetes mellitus (HCC) (Chronic)    On metformin 1000mg  BID. A1C 7.2 today. Foot and eye exam UTD. Plan --continue metformin 1000mg   BID --lifestyle factors discussed --f/u 3 months      Relevant Medications   enalapril (VASOTEC) 10 MG tablet   aspirin EC 325 MG tablet   Hyperlipidemia associated with type 2 diabetes mellitus (HCC) (Chronic)    ASCVD 10-year risk 13% Pt notes to be taking the 80mg  atorvastatin twice a day rather than once. Discussed that he should only take this once a day.      Relevant Medications   enalapril (VASOTEC) 10 MG tablet   aspirin EC 325 MG tablet     Other   Insomnia (Chronic)    Continue amitriptyline.      Relevant Medications   amitriptyline (ELAVIL) 100 MG tablet   RESOLVED: Alcohol use disorder, moderate, dependence (HCC) (Chronic)   Alcohol use disorder, mild, in early remission    Pt notes that he has been free of alcohol use for 8 weeks today. Pt congratulated. He notes that he has not been takign the naltrexone consistently and just takes it "once in a while". Plan --would consider it ok to discontinue use at this time --may take prn for cravings.       Other Visit Diagnoses    Need for immunization against influenza       Relevant Orders   Flu Vaccine QUAD 36+ mos IM (Completed)        Pt discussed with Dr. , MD Internal Medicine Resident PGY-2 [facility]  Residency Pager: [phone] 06/29/2020 10:01 AM

## 2020-06-28 ENCOUNTER — Ambulatory Visit (INDEPENDENT_AMBULATORY_CARE_PROVIDER_SITE_OTHER): Payer: Medicaid Other | Admitting: Internal Medicine

## 2020-06-28 ENCOUNTER — Other Ambulatory Visit: Payer: Self-pay

## 2020-06-28 ENCOUNTER — Ambulatory Visit (HOSPITAL_COMMUNITY)
Admission: RE | Admit: 2020-06-28 | Discharge: 2020-06-28 | Disposition: A | Discharge status: Home / Self Care | Payer: Medicaid Other | Source: Ambulatory Visit | Attending: Internal Medicine | Admitting: Internal Medicine

## 2020-06-28 VITALS — BP 140/83 | HR 94 | Temp 98.2°F | Wt 196.0 lb

## 2020-06-28 DIAGNOSIS — I208 Other forms of angina pectoris: Secondary | ICD-10-CM | POA: Diagnosis not present

## 2020-06-28 DIAGNOSIS — E785 Hyperlipidemia, unspecified: Secondary | ICD-10-CM | POA: Diagnosis not present

## 2020-06-28 DIAGNOSIS — I152 Hypertension secondary to endocrine disorders: Secondary | ICD-10-CM

## 2020-06-28 DIAGNOSIS — Z23 Encounter for immunization: Secondary | ICD-10-CM

## 2020-06-28 DIAGNOSIS — E1159 Type 2 diabetes mellitus with other circulatory complications: Secondary | ICD-10-CM | POA: Diagnosis not present

## 2020-06-28 DIAGNOSIS — I209 Angina pectoris, unspecified: Secondary | ICD-10-CM

## 2020-06-28 DIAGNOSIS — Z720 Tobacco use: Secondary | ICD-10-CM

## 2020-06-28 DIAGNOSIS — F1020 Alcohol dependence, uncomplicated: Secondary | ICD-10-CM | POA: Diagnosis not present

## 2020-06-28 DIAGNOSIS — I10 Essential (primary) hypertension: Secondary | ICD-10-CM | POA: Diagnosis not present

## 2020-06-28 DIAGNOSIS — Z7984 Long term (current) use of oral hypoglycemic drugs: Secondary | ICD-10-CM | POA: Diagnosis not present

## 2020-06-28 DIAGNOSIS — G4700 Insomnia, unspecified: Secondary | ICD-10-CM | POA: Diagnosis not present

## 2020-06-28 DIAGNOSIS — I2089 Other forms of angina pectoris: Secondary | ICD-10-CM

## 2020-06-28 DIAGNOSIS — E1169 Type 2 diabetes mellitus with other specified complication: Secondary | ICD-10-CM | POA: Diagnosis not present

## 2020-06-28 DIAGNOSIS — J4540 Moderate persistent asthma, uncomplicated: Secondary | ICD-10-CM

## 2020-06-28 DIAGNOSIS — E119 Type 2 diabetes mellitus without complications: Secondary | ICD-10-CM | POA: Diagnosis not present

## 2020-06-28 DIAGNOSIS — F1011 Alcohol abuse, in remission: Secondary | ICD-10-CM

## 2020-06-28 DIAGNOSIS — J449 Chronic obstructive pulmonary disease, unspecified: Secondary | ICD-10-CM | POA: Diagnosis not present

## 2020-06-28 MED ORDER — NITROGLYCERIN 0.4 MG SL SUBL: 0.4000 mg | SUBLINGUAL_TABLET | SUBLINGUAL | 0 refills | Status: DC | PRN

## 2020-06-28 MED ORDER — ASPIRIN EC 325 MG PO TBEC: 325.0000 mg | DELAYED_RELEASE_TABLET | Freq: Every day | ORAL | 3 refills | Status: DC

## 2020-06-28 MED ORDER — AMITRIPTYLINE HCL 100 MG PO TABS: 100.0000 mg | ORAL_TABLET | Freq: Every day | ORAL | 3 refills | Status: DC

## 2020-06-28 MED ORDER — TRELEGY ELLIPTA 100-62.5-25 MCG/INH IN AEPB: 1.0000 | INHALATION_SPRAY | Freq: Every day | RESPIRATORY_TRACT | 3 refills | Status: DC

## 2020-06-28 MED ORDER — ENALAPRIL MALEATE 10 MG PO TABS: 10.0000 mg | ORAL_TABLET | Freq: Every day | ORAL | 3 refills | Status: DC

## 2020-06-28 NOTE — Assessment & Plan Note (Addendum)
On metformin 1000mg  BID. A1C 7.2 today. Foot and eye exam UTD. Plan --continue metformin 1000mg  BID --lifestyle factors discussed --f/u 3 months

## 2020-06-28 NOTE — Assessment & Plan Note (Addendum)
Pt notes that he has been free of alcohol use for 8 weeks today. Pt congratulated. He notes that he has not been takign the naltrexone consistently and just takes it "once in a while". Plan --would consider it ok to discontinue use at this time --may take prn for cravings. 

## 2020-06-28 NOTE — Assessment & Plan Note (Signed)
Continue amitriptyline

## 2020-06-28 NOTE — Assessment & Plan Note (Deleted)
Pt notes that he has been free of alcohol use for 8 weeks today. Pt congratulated. He notes that he has not been takign the naltrexone consistently and just takes it "once in a while". Plan --would consider it ok to discontinue use at this time --may take prn for cravings.

## 2020-06-28 NOTE — Assessment & Plan Note (Addendum)
ASCVD 10-year risk 13% Pt notes to be taking the 80mg  atorvastatin twice a day rather than once. Discussed that he should only take this once a day.

## 2020-06-28 NOTE — Assessment & Plan Note (Addendum)
Blood pressure is above goal in the clinic today. Pt did not know he was supposed to be taking enalapril. Discussed the importance of blood pressure control in the setting of diabetes. Moderate physical activity. BMP today shows normal renal function. Potassium noted to be 5.3. Plan --resume enalapril 10mg  daily --lab recheck in 3-5d --f/u in 3 mo

## 2020-06-28 NOTE — Assessment & Plan Note (Signed)
Pt noting increased frequency of exertional chest pain over the past month. Present over the left chest wall. Described as sharp but non-radiating. Denies hand tingling/numbness. Resolves after 10-20min of rest. Denies chest pain in the office today No prior cardiac cath. EKG in the office today without ischemic changes but does show some possible LVH. INTERCHEST score 3--43% risk of CAD Risk factors: hypertension, diabetes, tobacco use Mitigating factors: on atorvastatin 80mg   Plan --referral placed to cardiology for ischemic workup --start aspirin 325mg  daily --start nitroglycerin 0.4mg  sublingual tabs prn for chest pain. If not relieved after 2 tablets, seek further evaluation immediately --continue lipitor --strict return precautions discussed.

## 2020-06-28 NOTE — Assessment & Plan Note (Addendum)
Only has a rescue inhaler at this time. Pt notes that he has coughing and shortness of breath with activity. Frequent night time awakings with coughing.  Unclear if he has asthma or COPD--I would favor COPD as being more likely given his smoking history. No prior PFTs. He has tried his sister's trelegy which works well for him Plan --trelegy sent to pharmacy --PFTs to clarify lung process --he will work on tobacco cessation in a few months as he just stopped drinking about 8w ago.

## 2020-06-29 LAB — CMP14 + ANION GAP
ALT: 19 IU/L (ref 0–44)
AST: 20 IU/L (ref 0–40)
Albumin/Globulin Ratio: 2.3 — ABNORMAL HIGH (ref 1.2–2.2)
Albumin: 4.8 g/dL (ref 3.8–4.9)
Alkaline Phosphatase: 135 IU/L — ABNORMAL HIGH (ref 44–121)
Anion Gap: 15 mmol/L (ref 10.0–18.0)
BUN/Creatinine Ratio: 13 (ref 9–20)
BUN: 11 mg/dL (ref 6–24)
Bilirubin Total: 0.3 mg/dL (ref 0.0–1.2)
CO2: 23 mmol/L (ref 20–29)
Calcium: 9.8 mg/dL (ref 8.7–10.2)
Chloride: 100 mmol/L (ref 96–106)
Creatinine, Ser: 0.86 mg/dL (ref 0.76–1.27)
GFR calc Af Amer: 114 mL/min/{1.73_m2} (ref >59)
GFR calc non Af Amer: 99 mL/min/{1.73_m2} (ref >59)
Globulin, Total: 2.1 g/dL (ref 1.5–4.5)
Glucose: 97 mg/dL (ref 65–99)
Potassium: 5.3 mmol/L — ABNORMAL HIGH (ref 3.5–5.2)
Sodium: 138 mmol/L (ref 134–144)
Total Protein: 6.9 g/dL (ref 6.0–8.5)

## 2020-06-29 NOTE — Progress Notes (Signed)
DOS 06/28/20:  Internal Medicine Clinic Attending  Case discussed with Dr. Ephriam Knuckles  At the time of the visit.  We reviewed the resident's history and exam and pertinent patient test results.  I agree with the assessment, diagnosis, and plan of care documented in the resident's note.  Unless otherwise instructed at the pending cardiology appt, ASA dose could potentially be reduced to 81 mg daily.

## 2020-06-30 ENCOUNTER — Telehealth: Payer: Self-pay | Admitting: *Deleted

## 2020-06-30 ENCOUNTER — Ambulatory Visit (INDEPENDENT_AMBULATORY_CARE_PROVIDER_SITE_OTHER): Payer: Medicaid Other | Admitting: Podiatry

## 2020-06-30 ENCOUNTER — Encounter: Payer: Self-pay | Admitting: Podiatry

## 2020-06-30 ENCOUNTER — Other Ambulatory Visit: Payer: Self-pay

## 2020-06-30 DIAGNOSIS — M79674 Pain in right toe(s): Secondary | ICD-10-CM | POA: Diagnosis not present

## 2020-06-30 DIAGNOSIS — E1142 Type 2 diabetes mellitus with diabetic polyneuropathy: Secondary | ICD-10-CM

## 2020-06-30 DIAGNOSIS — S90851A Superficial foreign body, right foot, initial encounter: Secondary | ICD-10-CM | POA: Diagnosis not present

## 2020-06-30 DIAGNOSIS — B351 Tinea unguium: Secondary | ICD-10-CM

## 2020-06-30 DIAGNOSIS — M79675 Pain in left toe(s): Secondary | ICD-10-CM

## 2020-06-30 NOTE — Telephone Encounter (Addendum)
Information was sent through [employer] for PA for Trelegy.  Awaiting determination .   Angelina Ok, RN 06/30/2020 3:47 PM.  PA for Trelegy was denied. Patient will need to try and fail the formulary drugs of Advair Diskus, Advair HFA, Dulera or Symbicort.  Message to be sent to the [facility] Team to consider the covered alternatives.  Angelina Ok, RN 07/01/2020 12:26 PM.

## 2020-07-01 ENCOUNTER — Other Ambulatory Visit (INDEPENDENT_AMBULATORY_CARE_PROVIDER_SITE_OTHER): Payer: Medicaid Other

## 2020-07-01 DIAGNOSIS — I10 Essential (primary) hypertension: Secondary | ICD-10-CM | POA: Diagnosis not present

## 2020-07-01 DIAGNOSIS — E1159 Type 2 diabetes mellitus with other circulatory complications: Secondary | ICD-10-CM

## 2020-07-01 DIAGNOSIS — I152 Hypertension secondary to endocrine disorders: Secondary | ICD-10-CM

## 2020-07-01 MED ORDER — FLUTICASONE-SALMETEROL 100-50 MCG/DOSE IN AEPB: 1.0000 | INHALATION_SPRAY | Freq: Two times a day (BID) | RESPIRATORY_TRACT | 2 refills | Status: DC

## 2020-07-01 NOTE — Progress Notes (Signed)
Subjective:  Patient ID: Ruben Pierce, male    DOB: 12-28-1965,  MRN: [id number]  54 y.o. male presents with at risk foot care with history of diabetic neuropathy and painful thick toenails that are difficult to trim. Pain interferes with ambulation. Aggravating factors include wearing enclosed shoe gear. Pain is relieved with periodic professional debridement..    Patient's blood sugar was 161 mg/dl this morning.  PCP: Elige Radon, MD and last visit was: 06/28/2020.  Review of Systems: Negative except as noted in the HPI.  Past Medical History:  Diagnosis Date  . Asthma   . ETOH abuse   . Hypertension   . TBI (traumatic brain injury) (HCC)   . Type 2 diabetes mellitus, uncontrolled (HCC) 2010   Past Surgical History:  Procedure Laterality Date  . BRAIN SURGERY  1999   Head injury, fell from tree 25 feet  . BUNIONECTOMY Right 2014   Patient Active Problem List   Diagnosis Date Noted  . Alcohol use disorder, mild, in early remission 06/28/2020  . Right sided abdominal pain 01/23/2020  . Hypertrophic toenail 03/21/2018  . GERD (gastroesophageal reflux disease) 05/24/2016  . Stable angina (HCC) 01/24/2016  . Obstructive lung disease (HCC) 01/24/2016  . Tobacco abuse 01/24/2016  . Hyperlipidemia associated with type 2 diabetes mellitus (HCC) 11/11/2015  . Insomnia 11/11/2015  . Type 2 diabetes mellitus (HCC) 11/08/2015  . Hypertension associated with diabetes (HCC) 11/08/2015  . TBI (traumatic brain injury) (HCC)     Current Outpatient Medications:  .  albuterol (VENTOLIN HFA) 108 (90 Base) MCG/ACT inhaler, INHALE 2 PUFFS INTO THE LUNGS EVERY 6 HOURS AS NEEDED FOR WHEEZING OR SHORTNESS OF BREATH, Disp: 8.5 g, Rfl: 6 .  amitriptyline (ELAVIL) 100 MG tablet, Take 1 tablet (100 mg total) by mouth at bedtime., Disp: 30 tablet, Rfl: 3 .  aspirin EC 325 MG tablet, Take 1 tablet (325 mg total) by mouth daily., Disp: 100 tablet, Rfl: 3 .  atorvastatin (LIPITOR) 80 MG tablet,  Take 1 tablet (80 mg total) by mouth daily., Disp: 90 tablet, Rfl: 2 .  diclofenac Sodium (VOLTAREN) 1 % GEL, Apply 2 g topically 4 (four) times daily. Rub into affected area of foot 2 to 4 times daily, Disp: 100 g, Rfl: 2 .  enalapril (VASOTEC) 10 MG tablet, Take 1 tablet (10 mg total) by mouth daily., Disp: 90 tablet, Rfl: 3 .  Fluticasone-Salmeterol (ADVAIR DISKUS) 100-50 MCG/DOSE AEPB, Inhale 1 puff into the lungs in the morning and at bedtime., Disp: 60 each, Rfl: 2 .  Lancets (FREESTYLE) lancets, Use as instructed, Disp: 100 each, Rfl: 12 .  metFORMIN (GLUCOPHAGE) 1000 MG tablet, Take 1 tablet (1,000 mg total) by mouth 2 (two) times daily with a meal., Disp: 180 tablet, Rfl: 2 .  naltrexone (DEPADE) 50 MG tablet, Take 1 tablet (50 mg total) by mouth daily., Disp: 90 tablet, Rfl: 3 .  NICOTINE STEP 2 14 MG/24HR patch, Place 1 patch (14 mg total) onto the skin daily., Disp: , Rfl:  .  nitroGLYCERIN (NITROSTAT) 0.4 MG SL tablet, Place 1 tablet (0.4 mg total) under the tongue every 5 (five) minutes as needed for chest pain. If chest pain does not resolve after 2 tablets, please obtain evaluation in the ER., Disp: 100 tablet, Rfl: 0 .  pantoprazole (PROTONIX) 40 MG tablet, Take 1 tablet (40 mg total) by mouth daily., Disp: 90 tablet, Rfl: 3 No Known Allergies Social History   Tobacco Use  Smoking Status Current Every Day Smoker  .  Packs/day: 0.50  . Types: Cigarettes  Smokeless Tobacco Never Used  Tobacco Comment   Sometimes more.   Objective:  There were no vitals filed for this visit. Constitutional Patient is a pleasant 54 y.o. African American male WD, WN in NAD.Marland Kitchen AAO x 3.  Vascular Capillary refill time to digits immediate b/l. Palpable pedal pulses b/l LE. Pedal hair sparse. Lower extremity skin temperature gradient within normal limits. No cyanosis or clubbing noted.  Neurologic Oriented to person, place, and time. Protective sensation decreased with 10 gram monofilament b/l.  Vibratory sensation decreased b/l.  Dermatologic Pedal skin with normal turgor, texture and tone bilaterally. No open wounds bilaterally. No interdigital macerations bilaterally. Toenails 1-5 left elongated, discolored, dystrophic, thickened, and crumbly with subungual debris and tenderness to dorsal palpation. Anonychia noted R 2nd toe. Nailbed(s) epithelialized. Palpable FBO superficial right 3rd toe. No POP. No granuloma evident. Suspected small shard of glass.  Orthopedic: Normal muscle strength 5/5 to all lower extremity muscle groups bilaterally. No pain crepitus or joint limitation noted with ROM b/l. No gross bony deformities bilaterally.   Hemoglobin A1C Latest Ref Rng & Units 06/17/2020 01/23/2020 10/28/2019  HGBA1C 4.0 - 5.6 % 7.2(A) 5.8(A) 5.5  Some recent data might be hidden   Assessment:   1. Pain due to onychomycosis of toenails of both feet   2. Foreign body in right foot, initial encounter   3. Diabetic peripheral neuropathy associated with type 2 diabetes mellitus (HCC)    Plan:  Patient was evaluated and treated and all questions answered.  Onychomycosis with pain -Nails palliatively debridement as below. -Educated on self-care  Procedure: Nail Debridement Rationale: Pain Type of Debridement: manual, sharp debridement. Instrumentation: Nail nipper, rotary burr. Number of Nails: 9  -Examined patient. -Continue diabetic foot care principles. -Toenails 1-5 left, R hallux, R 3rd toe, R 4th toe and R 5th toe debrided in length and girth without iatrogenic bleeding with sterile nail nipper and dremel.  -Superficial shard of glass was removed with currette. Digit cleansed with alcohol and TAO applied. No further treatment required by patient. -Patient to report any pedal injuries to medical professional immediately. -Patient to continue soft, supportive shoe gear daily. -Patient/POA to call should there be question/concern in the interim.  Return in about 3 months (around  09/29/2020).  Freddie Breech, DPM

## 2020-07-01 NOTE — Telephone Encounter (Signed)
Advair diskus sent to pharmacy.

## 2020-07-02 ENCOUNTER — Ambulatory Visit: Payer: Medicaid Other | Admitting: Internal Medicine

## 2020-07-02 DIAGNOSIS — Z419 Encounter for procedure for purposes other than remedying health state, unspecified: Secondary | ICD-10-CM | POA: Diagnosis not present

## 2020-07-02 LAB — BASIC METABOLIC PANEL
BUN/Creatinine Ratio: 15 (ref 9–20)
BUN: 14 mg/dL (ref 6–24)
CO2: 22 mmol/L (ref 20–29)
Calcium: 9 mg/dL (ref 8.7–10.2)
Chloride: 99 mmol/L (ref 96–106)
Creatinine, Ser: 0.93 mg/dL (ref 0.76–1.27)
GFR calc Af Amer: 108 mL/min/{1.73_m2} (ref >59)
GFR calc non Af Amer: 93 mL/min/{1.73_m2} (ref >59)
Glucose: 110 mg/dL — ABNORMAL HIGH (ref 65–99)
Potassium: 4.1 mmol/L (ref 3.5–5.2)
Sodium: 137 mmol/L (ref 134–144)

## 2020-07-07 ENCOUNTER — Encounter: Payer: Self-pay | Admitting: *Deleted

## 2020-07-20 ENCOUNTER — Other Ambulatory Visit: Payer: Self-pay | Admitting: Internal Medicine

## 2020-07-20 DIAGNOSIS — G4700 Insomnia, unspecified: Secondary | ICD-10-CM

## 2020-08-02 DIAGNOSIS — Z419 Encounter for procedure for purposes other than remedying health state, unspecified: Secondary | ICD-10-CM | POA: Diagnosis not present

## 2020-08-12 ENCOUNTER — Ambulatory Visit: Payer: Medicaid Other | Admitting: Internal Medicine

## 2020-08-12 NOTE — Progress Notes (Deleted)
Cardiology Office Note:    Date:  08/12/2020   ID:  Ruben Pierce, DOB 10/30/1965, MRN [id number]  PCP:  Elige Radon, MD  [facility] Cardiologist:  No primary care provider on file.  [facility] Electrophysiologist:  None   Referring MD: Gust Rung, DO   No chief complaint on file. ***  History of Present Illness:    Ruben Pierce is a 54 y.o. male with a hx of diabetes with HTN, HLD, alcohol use Tobacco Abuse, who presents for evaluation.   Past Medical History:  Diagnosis Date  . Asthma   . ETOH abuse   . Hypertension   . TBI (traumatic brain injury) (HCC)   . Type 2 diabetes mellitus, uncontrolled (HCC) 2010    Past Surgical History:  Procedure Laterality Date  . BRAIN SURGERY  1999   Head injury, fell from tree 25 feet  . BUNIONECTOMY Right 2014    Current Medications: No outpatient medications have been marked as taking for the 08/12/20 encounter (Appointment) with Christell Constant, MD.     Allergies:   Patient has no known allergies.   Social History   Socioeconomic History  . Marital status: Single    Spouse name: Not on file  . Number of children: Not on file  . Years of education: 9th grade  . Highest education level: Not on file  Occupational History  . Occupation: diabled  Tobacco Use  . Smoking status: Current Every Day Smoker    Packs/day: 0.50    Types: Cigarettes  . Smokeless tobacco: Never Used  . Tobacco comment: Sometimes more.  Vaping Use  . Vaping Use: Never used  Substance and Sexual Activity  . Alcohol use: Yes    Alcohol/week: 0.0 standard drinks    Comment: Beer.; 12 cans a day.  . Drug use: Yes    Types: Marijuana    Comment: Rarely.  Marland Kitchen Sexual activity: Not on file  Other Topics Concern  . Not on file  Social History Narrative   Previously worked as Designer, industrial/product, in Sept 6th 1999 fell 25 feet and landed on his head had resultant TBI.  Now is cared for by his Sister.   Social Determinants of Health    Financial Resource Strain:   . Difficulty of Paying Living Expenses: Not on file  Food Insecurity:   . Worried About Programme researcher, broadcasting/film/video in the Last Year: Not on file  . Ran Out of Food in the Last Year: Not on file  Transportation Needs:   . Lack of Transportation (Medical): Not on file  . Lack of Transportation (Non-Medical): Not on file  Physical Activity:   . Days of Exercise per Week: Not on file  . Minutes of Exercise per Session: Not on file  Stress:   . Feeling of Stress : Not on file  Social Connections:   . Frequency of Communication with Friends and Family: Not on file  . Frequency of Social Gatherings with Friends and Family: Not on file  . Attends Religious Services: Not on file  . Active Member of Clubs or Organizations: Not on file  . Attends Banker Meetings: Not on file  . Marital Status: Not on file     Family History: The patient's ***family history includes CAD in his mother; Diabetes Mellitus II in his father; Hyperlipidemia in his father; Hypertension in his father. There is no history of Colon cancer, Esophageal cancer, Rectal cancer, or Stomach cancer.  ROS:  Please see the history of present illness.    *** All other systems reviewed and are negative.  EKGs/Labs/Other Studies Reviewed:    The following studies were reviewed today: ***  EKG:  EKG is *** ordered today.  The ekg ordered today demonstrates ***  Recent Labs: 09/06/2019: Hemoglobin 15.6; Platelets 299 06/28/2020: ALT 19 07/01/2020: BUN 14; Creatinine, Ser 0.93; Potassium 4.1; Sodium 137  Recent Lipid Panel    Component Value Date/Time   CHOL 153 10/28/2019 0952   TRIG 229 (H) 10/28/2019 0952   HDL 65 10/28/2019 0952   CHOLHDL 2.4 10/28/2019 0952   LDLCALC 52 10/28/2019 0952     Risk Assessment/Calculations:   {Does this patient have ATRIAL FIBRILLATION?:[phone]}   Physical Exam:    VS:  There were no vitals taken for this visit.    Wt Readings from Last 3  Encounters:  06/28/20 196 lb (88.9 kg)  06/17/20 196 lb 8 oz (89.1 kg)  01/23/20 184 lb 8 oz (83.7 kg)     GEN: *** Well nourished, well developed in no acute distress HEENT: Normal NECK: No JVD; No carotid bruits LYMPHATICS: No lymphadenopathy CARDIAC: ***RRR, no murmurs, rubs, gallops RESPIRATORY:  Clear to auscultation without rales, wheezing or rhonchi  ABDOMEN: Soft, non-tender, non-distended MUSCULOSKELETAL:  No edema; No deformity  SKIN: Warm and dry NEUROLOGIC:  Alert and oriented x 3 PSYCHIATRIC:  Normal affect   ASSESSMENT:    No diagnosis found. PLAN:    In order of problems listed above:  1. ***    Shared Decision Making/Informed Consent      Medication Adjustments/Labs and Tests Ordered: Current medicines are reviewed at length with the patient today.  Concerns regarding medicines are outlined above.  No orders of the defined types were placed in this encounter.  No orders of the defined types were placed in this encounter.   There are no Patient Instructions on file for this visit.   Signed, Christell Constant, MD  08/12/2020 1:28 PM    [facility]

## 2020-08-16 ENCOUNTER — Encounter: Payer: Self-pay | Admitting: General Practice

## 2020-08-16 NOTE — Addendum Note (Signed)
Addended by: Neomia Dear on: 08/16/2020 07:40 PM   Modules accepted: Orders

## 2020-08-18 ENCOUNTER — Other Ambulatory Visit: Payer: Self-pay | Admitting: Podiatry

## 2020-09-01 ENCOUNTER — Other Ambulatory Visit: Payer: Self-pay | Admitting: *Deleted

## 2020-09-01 ENCOUNTER — Other Ambulatory Visit: Payer: Self-pay | Admitting: Internal Medicine

## 2020-09-01 DIAGNOSIS — Z419 Encounter for procedure for purposes other than remedying health state, unspecified: Secondary | ICD-10-CM | POA: Diagnosis not present

## 2020-09-02 MED ORDER — NICOTINE STEP 2 14 MG/24HR TD PT24
14.0000 mg | MEDICATED_PATCH | Freq: Every day | TRANSDERMAL | 0 refills | Status: DC
Start: 2020-09-02 — End: 2020-11-22

## 2020-09-12 NOTE — Assessment & Plan Note (Addendum)
Current medications: enalapril 10mg  daily Denies adverse medication effects BP is at goal in the office today BP Readings from Last 3 Encounters:  09/15/20 119/89  06/28/20 140/83  06/17/20 124/87   Plan -BMP today -continue current management -f/u 75mo  Addendum: BMP reviewed--electrolytes and renal function wnl. No change in management at this time 09/16/20 10:02 AM

## 2020-09-12 NOTE — Progress Notes (Signed)
Office Visit   Patient ID: Ruben Pierce, male    DOB: 09/10/1966, 54 y.o.   MRN: [id number]  Subjective:  CC: chronic hypertension, diabetes, COPD  HPI 54 y.o. presents today for follow up of his chronic medical conditions. See details, assessment and plan under problem based charting.      ACTIVE MEDICATIONS   Outpatient Medications Prior to Visit  Medication Sig Dispense Refill  . ADVAIR DISKUS 100-50 MCG/DOSE AEPB Inhale 1 puff into the lungs in the morning and at bedtime. 60 each 2  . albuterol (VENTOLIN HFA) 108 (90 Base) MCG/ACT inhaler INHALE 2 PUFFS INTO THE LUNGS EVERY 6 HOURS AS NEEDED FOR WHEEZING OR SHORTNESS OF BREATH 8.5 g 6  . Lancets (FREESTYLE) lancets Use as instructed 100 each 12  . NICOTINE STEP 2 14 MG/24HR patch Place 1 patch (14 mg total) onto the skin daily. 90 patch 0  . nitroGLYCERIN (NITROSTAT) 0.4 MG SL tablet Place 1 tablet (0.4 mg total) under the tongue every 5 (five) minutes as needed for chest pain. If chest pain does not resolve after 2 tablets, please obtain evaluation in the ER. 100 tablet 0  . amitriptyline (ELAVIL) 100 MG tablet Take 1 tablet (100 mg total) by mouth at bedtime. 30 tablet 3  . aspirin EC 325 MG tablet Take 1 tablet (325 mg total) by mouth daily. 100 tablet 3  . atorvastatin (LIPITOR) 80 MG tablet Take 1 tablet (80 mg total) by mouth daily. 90 tablet 2  . diclofenac Sodium (VOLTAREN) 1 % GEL Apply 2 g topically 4 (four) times daily. Rub into affected area of foot 2 to 4 times daily 100 g 2  . enalapril (VASOTEC) 10 MG tablet Take 1 tablet (10 mg total) by mouth daily. 90 tablet 3  . metFORMIN (GLUCOPHAGE) 1000 MG tablet Take 1 tablet (1,000 mg total) by mouth 2 (two) times daily with a meal. 180 tablet 2  . naltrexone (DEPADE) 50 MG tablet Take 1 tablet (50 mg total) by mouth daily. 90 tablet 3  . pantoprazole (PROTONIX) 40 MG tablet Take 1 tablet (40 mg total) by mouth daily. 90 tablet 3   No facility-administered medications  prior to visit.     ROS  Review of Systems  Constitutional: Negative for chills, fatigue and fever.  Respiratory: Positive for cough. Negative for chest tightness, shortness of breath and wheezing.   Cardiovascular: Negative for chest pain and leg swelling.  Gastrointestinal: Negative for abdominal pain.  Neurological: Negative for dizziness, syncope, light-headedness and headaches.    Objective:   BP 119/89 (BP Location: Left Arm, Cuff Size: Normal)   Pulse (!) 110   Temp 98.2 F (36.8 C) (Oral)   Ht 6\' 1"  (1.854 m)   Wt 200 lb 6.4 oz (90.9 kg)   SpO2 100% Comment: room air  BMI 26.44 kg/m  Wt Readings from Last 3 Encounters:  09/15/20 200 lb 6.4 oz (90.9 kg)  06/28/20 196 lb (88.9 kg)  06/17/20 196 lb 8 oz (89.1 kg)   BP Readings from Last 3 Encounters:  09/15/20 119/89  06/28/20 140/83  06/17/20 124/87   Physical Exam Constitutional:      Appearance: Normal appearance.  Cardiovascular:     Rate and Rhythm: Normal rate and regular rhythm.  Pulmonary:     Effort: Pulmonary effort is normal.     Comments: Expiratory wheezes throughout Skin:    General: Skin is warm and dry.     Health Maintenance:   Health  Maintenance  Topic Date Due  . COVID-19 Vaccine (1) Never done  . HEMOGLOBIN A1C  12/14/2020  . FOOT EXAM  03/23/2021  . OPHTHALMOLOGY EXAM  03/29/2021  . TETANUS/TDAP  03/29/2029  . COLONOSCOPY  11/26/2029  . INFLUENZA VACCINE  Completed  . PNEUMOCOCCAL POLYSACCHARIDE VACCINE AGE 31-64 HIGH RISK  Completed  . Hepatitis C Screening  Completed  . HIV Screening  Completed     Assessment & Plan:   Problem List Items Addressed This Visit      Cardiovascular and Mediastinum   Hypertension associated with diabetes (HCC) (Chronic)    Current medications: enalapril 10mg  daily Denies adverse medication effects BP is at goal in the office today BP Readings from Last 3 Encounters:  09/15/20 119/89  06/28/20 140/83  06/17/20 124/87   Plan -BMP  today -continue current management      Relevant Medications   sildenafil (VIAGRA) 50 MG tablet   aspirin EC 325 MG tablet   atorvastatin (LIPITOR) 80 MG tablet   enalapril (VASOTEC) 10 MG tablet   metFORMIN (GLUCOPHAGE) 1000 MG tablet   Other Relevant Orders   Basic metabolic panel   Stable angina (HCC)    No recent episodes of chest pain.      Relevant Medications   sildenafil (VIAGRA) 50 MG tablet   amitriptyline (ELAVIL) 100 MG tablet   aspirin EC 325 MG tablet   atorvastatin (LIPITOR) 80 MG tablet   enalapril (VASOTEC) 10 MG tablet     Respiratory   Obstructive lung disease (HCC)    Current medications: advair 100-41mcg daily Med Adherence:  [x]  Yes    []  No Medication side effects:  []  Yes    [x]  No  Symptom control: []  Yes    [x]  No  # of exacerbations in past 31mo: 0 Smoking history: [x]  Yes    []  No --30 pack year history Currently smoking: [x]  Yes    []  No    Assessment: he has a little more expiratory wheezing on exam today but denies any acute worsening of his symptoms that would suggest a COPD exacerbation. He does note a chronic productive cough that is worse in the mornings which, given his smoking history, may suggest a chronic bronchitis picture. Unfortunately, he has never followed through with PFTs. I do think he may benefit from the addition of a LAMA. I attempted to get trelegy for him last visit however his insurance denied it.  He is smoking more since he quit using alcohol which is also probably playing a role in his symptom control and exam findings. He is up to around a pack a day from half pack. He is not ready to quit smoking at this time.   Plan:  -Message sent to our clinical pharmacist to discuss alternatives that his insurance may be more amendable to.  -Will obtain a low dose chest CT for lung cancer screening -continue to encourage smoking cessation          Digestive   GERD (gastroesophageal reflux disease)    Current medications:  protonix 40mg  daily Sx are well controlled at this time.  Plan:  -d/c protonix -transition to famotidine (H2B) -if symptoms worsen again, will restart protonix and refer to GI      Relevant Medications   famotidine (PEPCID) 20 MG tablet     Endocrine   Type 2 diabetes mellitus (HCC) (Chronic)    Current medications: metformin 1000mg  BID Last A1C 06/2020 was 7.2 A1C today is 7.5 Foot  exam and retinal screening UTD Plan -Continue current management -follow up in 3 mo -A1C goal <8%      Relevant Medications   aspirin EC 325 MG tablet   atorvastatin (LIPITOR) 80 MG tablet   enalapril (VASOTEC) 10 MG tablet   metFORMIN (GLUCOPHAGE) 1000 MG tablet   Other Relevant Orders   POC Hbg A1C (Completed)   Hyperlipidemia associated with type 2 diabetes mellitus (HCC) (Chronic)   Relevant Medications   aspirin EC 325 MG tablet   atorvastatin (LIPITOR) 80 MG tablet   enalapril (VASOTEC) 10 MG tablet   metFORMIN (GLUCOPHAGE) 1000 MG tablet     Other   Insomnia (Chronic)   Relevant Medications   amitriptyline (ELAVIL) 100 MG tablet   Tobacco abuse (Chronic)    Increase cigarette smoking since abstaining from alcohol. Increase 0.5>1 ppd over the past few months. He is not interested in cessation at this time. Discussed risks/benefits of lung cancer screening with low dose chest CT Plan -he is agreeable to low dose lung CT -continue to monitor for readiness to quit at each visit      Relevant Orders   CT CHEST LUNG CA SCREEN LOW DOSE W/O CM   Health care maintenance    Pt notes that he has received COVID 19 vaccination + booster. He does not have his vaccination card with at today's visit but will call to update our medical records at a future date.      Alcohol use disorder, mild, in early remission    No longer taking naltrexone. He has had one alcoholic beverage in the past 16X. Discussed importance of abstinence given his history. Plan: continue to monitor at 3 month visits       Erectile dysfunction    Risk factors: heart disease, diabetes, medications He is able to sustain moderate activity without chest pain for 15-20 minutes.  Discussed importance of not using nitroglycerin with viagra. He is agreeable to letting us know if he is developing chest pain with sexual activity. Plan -viagra sent to pharmacy -f/u in 3 months       Other Visit Diagnoses    Screening for lung cancer    -  Primary   Relevant Orders   CT CHEST LUNG CA SCREEN LOW DOSE W/O CM   Type 2 diabetes mellitus with complication, with long-term current use of insulin (HCC)       Relevant Medications   aspirin EC 325 MG tablet   atorvastatin (LIPITOR) 80 MG tablet   enalapril (VASOTEC) 10 MG tablet   metFORMIN (GLUCOPHAGE) 1000 MG tablet        Pt discussed with Dr. Lyn Records, MD Internal Medicine Resident PGY-2 [facility] Pager: [phone] 09/15/2020 3:55 PM

## 2020-09-12 NOTE — Assessment & Plan Note (Addendum)
Current medications: metformin 1000mg  BID Last A1C 06/2020 was 7.2 A1C today is 7.5 Foot exam and retinal screening UTD Plan -Continue current management -follow up in 3 mo -A1C goal <8%

## 2020-09-15 ENCOUNTER — Encounter: Payer: Self-pay | Admitting: Internal Medicine

## 2020-09-15 ENCOUNTER — Other Ambulatory Visit: Payer: Self-pay

## 2020-09-15 ENCOUNTER — Ambulatory Visit (INDEPENDENT_AMBULATORY_CARE_PROVIDER_SITE_OTHER): Payer: Medicaid Other | Admitting: Internal Medicine

## 2020-09-15 VITALS — BP 119/89 | HR 110 | Temp 98.2°F | Ht 73.0 in | Wt 200.4 lb

## 2020-09-15 DIAGNOSIS — F17210 Nicotine dependence, cigarettes, uncomplicated: Secondary | ICD-10-CM

## 2020-09-15 DIAGNOSIS — K219 Gastro-esophageal reflux disease without esophagitis: Secondary | ICD-10-CM

## 2020-09-15 DIAGNOSIS — Z720 Tobacco use: Secondary | ICD-10-CM

## 2020-09-15 DIAGNOSIS — G4700 Insomnia, unspecified: Secondary | ICD-10-CM | POA: Diagnosis not present

## 2020-09-15 DIAGNOSIS — F1011 Alcohol abuse, in remission: Secondary | ICD-10-CM | POA: Diagnosis not present

## 2020-09-15 DIAGNOSIS — E785 Hyperlipidemia, unspecified: Secondary | ICD-10-CM

## 2020-09-15 DIAGNOSIS — E1159 Type 2 diabetes mellitus with other circulatory complications: Secondary | ICD-10-CM | POA: Diagnosis not present

## 2020-09-15 DIAGNOSIS — Z122 Encounter for screening for malignant neoplasm of respiratory organs: Secondary | ICD-10-CM | POA: Diagnosis not present

## 2020-09-15 DIAGNOSIS — I152 Hypertension secondary to endocrine disorders: Secondary | ICD-10-CM | POA: Diagnosis not present

## 2020-09-15 DIAGNOSIS — E118 Type 2 diabetes mellitus with unspecified complications: Secondary | ICD-10-CM

## 2020-09-15 DIAGNOSIS — J449 Chronic obstructive pulmonary disease, unspecified: Secondary | ICD-10-CM | POA: Diagnosis not present

## 2020-09-15 DIAGNOSIS — Z0000 Encounter for general adult medical examination without abnormal findings: Secondary | ICD-10-CM

## 2020-09-15 DIAGNOSIS — E119 Type 2 diabetes mellitus without complications: Secondary | ICD-10-CM | POA: Diagnosis not present

## 2020-09-15 DIAGNOSIS — I208 Other forms of angina pectoris: Secondary | ICD-10-CM

## 2020-09-15 DIAGNOSIS — Z794 Long term (current) use of insulin: Secondary | ICD-10-CM | POA: Diagnosis not present

## 2020-09-15 DIAGNOSIS — I2089 Other forms of angina pectoris: Secondary | ICD-10-CM

## 2020-09-15 DIAGNOSIS — N529 Male erectile dysfunction, unspecified: Secondary | ICD-10-CM | POA: Diagnosis not present

## 2020-09-15 DIAGNOSIS — E1169 Type 2 diabetes mellitus with other specified complication: Secondary | ICD-10-CM

## 2020-09-15 LAB — POCT GLYCOSYLATED HEMOGLOBIN (HGB A1C): Hemoglobin A1C: 7.5 % — AB (ref 4.0–5.6)

## 2020-09-15 LAB — GLUCOSE, CAPILLARY: Glucose-Capillary: 213 mg/dL — ABNORMAL HIGH (ref 70–99)

## 2020-09-15 MED ORDER — AMITRIPTYLINE HCL 100 MG PO TABS: 100.0000 mg | ORAL_TABLET | Freq: Every day | ORAL | 3 refills | Status: DC

## 2020-09-15 MED ORDER — METFORMIN HCL 1000 MG PO TABS: 1000.0000 mg | ORAL_TABLET | Freq: Two times a day (BID) | ORAL | 2 refills | Status: DC

## 2020-09-15 MED ORDER — ATORVASTATIN CALCIUM 80 MG PO TABS: 80.0000 mg | ORAL_TABLET | Freq: Every day | ORAL | 2 refills | Status: DC

## 2020-09-15 MED ORDER — SILDENAFIL CITRATE 50 MG PO TABS: 50.0000 mg | ORAL_TABLET | ORAL | 1 refills | Status: DC | PRN

## 2020-09-15 MED ORDER — ASPIRIN EC 325 MG PO TBEC: 325.0000 mg | DELAYED_RELEASE_TABLET | Freq: Every day | ORAL | 3 refills | Status: AC

## 2020-09-15 MED ORDER — ENALAPRIL MALEATE 10 MG PO TABS: 10.0000 mg | ORAL_TABLET | Freq: Every day | ORAL | 3 refills | Status: DC

## 2020-09-15 MED ORDER — FAMOTIDINE 20 MG PO TABS: 20.0000 mg | ORAL_TABLET | Freq: Two times a day (BID) | ORAL | 3 refills | Status: DC

## 2020-09-15 NOTE — Assessment & Plan Note (Signed)
Risk factors: heart disease, diabetes, medications He is able to sustain moderate activity without chest pain for 15-20 minutes.  Discussed importance of not using nitroglycerin with viagra. He is agreeable to letting us know if he is developing chest pain with sexual activity. Plan -viagra sent to pharmacy -f/u in 3 months

## 2020-09-15 NOTE — Assessment & Plan Note (Signed)
No longer taking naltrexone. He has had one alcoholic beverage in the past 64B. Discussed importance of abstinence given his history. Plan: continue to monitor at 3 month visits

## 2020-09-15 NOTE — Assessment & Plan Note (Signed)
Pt notes that he has received COVID 19 vaccination + booster. He does not have his vaccination card with at today's visit but will call to update our medical records at a future date.

## 2020-09-15 NOTE — Assessment & Plan Note (Signed)
Increase cigarette smoking since abstaining from alcohol. Increase 0.5>1 ppd over the past few months. He is not interested in cessation at this time. Discussed risks/benefits of lung cancer screening with low dose chest CT Plan -he is agreeable to low dose lung CT -continue to monitor for readiness to quit at each visit

## 2020-09-15 NOTE — Assessment & Plan Note (Signed)
No recent episodes of chest pain

## 2020-09-15 NOTE — Assessment & Plan Note (Signed)
Current medications: advair 100-65mcg daily Med Adherence:  [x]  Yes    []  No Medication side effects:  []  Yes    [x]  No  Symptom control: []  Yes    [x]  No  # of exacerbations in past 28mo: 0 Smoking history: [x]  Yes    []  No --30 pack year history Currently smoking: [x]  Yes    []  No    Assessment: he has a little more expiratory wheezing on exam today but denies any acute worsening of his symptoms that would suggest a COPD exacerbation. He does note a chronic productive cough that is worse in the mornings which, given his smoking history, may suggest a chronic bronchitis picture. Unfortunately, he has never followed through with PFTs. I do think he may benefit from the addition of a LAMA. I attempted to get trelegy for him last visit however his insurance denied it.  He is smoking more since he quit using alcohol which is also probably playing a role in his symptom control and exam findings. He is up to around a pack a day from half pack. He is not ready to quit smoking at this time.   Plan:  -Message sent to our clinical pharmacist to discuss alternatives that his insurance may be more amendable to.  -Will obtain a low dose chest CT for lung cancer screening -continue to encourage smoking cessation

## 2020-09-15 NOTE — Assessment & Plan Note (Signed)
Current medications: protonix 40mg  daily Sx are well controlled at this time.  Plan:  -d/c protonix -transition to famotidine (H2B) -if symptoms worsen again, will restart protonix and refer to GI

## 2020-09-16 LAB — BASIC METABOLIC PANEL
BUN/Creatinine Ratio: 14 (ref 9–20)
BUN: 14 mg/dL (ref 6–24)
CO2: 20 mmol/L (ref 20–29)
Calcium: 9.2 mg/dL (ref 8.7–10.2)
Chloride: 103 mmol/L (ref 96–106)
Creatinine, Ser: 0.99 mg/dL (ref 0.76–1.27)
GFR calc Af Amer: 100 mL/min/{1.73_m2} (ref >59)
GFR calc non Af Amer: 87 mL/min/{1.73_m2} (ref >59)
Glucose: 155 mg/dL — ABNORMAL HIGH (ref 65–99)
Potassium: 4.7 mmol/L (ref 3.5–5.2)
Sodium: 141 mmol/L (ref 134–144)

## 2020-09-16 NOTE — Progress Notes (Signed)
Internal Medicine Clinic Attending  Case discussed with Dr. Christian at the time of the visit.  We reviewed the resident's history and exam and pertinent patient test results.  I agree with the assessment, diagnosis, and plan of care documented in the resident's note.  Alexander Raines, M.D., Ph.D.  

## 2020-09-29 ENCOUNTER — Encounter: Payer: Self-pay | Admitting: Podiatry

## 2020-09-29 ENCOUNTER — Ambulatory Visit (INDEPENDENT_AMBULATORY_CARE_PROVIDER_SITE_OTHER): Payer: Medicaid Other | Admitting: Podiatry

## 2020-09-29 ENCOUNTER — Other Ambulatory Visit: Payer: Self-pay

## 2020-09-29 DIAGNOSIS — E1142 Type 2 diabetes mellitus with diabetic polyneuropathy: Secondary | ICD-10-CM

## 2020-09-29 DIAGNOSIS — M79674 Pain in right toe(s): Secondary | ICD-10-CM | POA: Diagnosis not present

## 2020-09-29 DIAGNOSIS — M79675 Pain in left toe(s): Secondary | ICD-10-CM | POA: Diagnosis not present

## 2020-09-29 DIAGNOSIS — B351 Tinea unguium: Secondary | ICD-10-CM | POA: Diagnosis not present

## 2020-10-01 NOTE — Progress Notes (Signed)
Subjective:  Patient ID: Ruben Pierce, male    DOB: [birth year],  MRN: [id number]  54 y.o. male presents with at risk foot care with history of diabetic neuropathy and painful thick toenails that are difficult to trim. Pain interferes with ambulation. Aggravating factors include wearing enclosed shoe gear. Pain is relieved with periodic professional debridement..    Patient's blood sugar was 136 mg/dl this morning.  PCP: Elige Radon, MD and last visit was: 06/28/2020.  Review of Systems: Negative except as noted in the HPI.  Past Medical History:  Diagnosis Date  . Asthma   . ETOH abuse   . Hypertension   . TBI (traumatic brain injury) (HCC)   . Type 2 diabetes mellitus, uncontrolled (HCC) 2010   Past Surgical History:  Procedure Laterality Date  . BRAIN SURGERY  1999   Head injury, fell from tree 25 feet  . BUNIONECTOMY Right 2014   Patient Active Problem List   Diagnosis Date Noted  . Erectile dysfunction 09/15/2020  . Alcohol use disorder, mild, in early remission 06/28/2020  . Health care maintenance 05/24/2016  . GERD (gastroesophageal reflux disease) 05/24/2016  . Stable angina (HCC) 01/24/2016  . Obstructive lung disease (HCC) 01/24/2016  . Tobacco abuse 01/24/2016  . Hyperlipidemia associated with type 2 diabetes mellitus (HCC) 11/11/2015  . Insomnia 11/11/2015  . Type 2 diabetes mellitus (HCC) 11/08/2015  . Hypertension associated with diabetes (HCC) 11/08/2015  . TBI (traumatic brain injury) [facility])     Current Outpatient Medications:  .  ADVAIR DISKUS 100-50 MCG/DOSE AEPB, Inhale 1 puff into the lungs in the morning and at bedtime., Disp: 60 each, Rfl: 2 .  albuterol (VENTOLIN HFA) 108 (90 Base) MCG/ACT inhaler, INHALE 2 PUFFS INTO THE LUNGS EVERY 6 HOURS AS NEEDED FOR WHEEZING OR SHORTNESS OF BREATH, Disp: 8.5 g, Rfl: 6 .  amitriptyline (ELAVIL) 100 MG tablet, Take 1 tablet (100 mg total) by mouth at bedtime., Disp: 30 tablet, Rfl: 3 .  aspirin EC 325 MG  tablet, Take 1 tablet (325 mg total) by mouth daily., Disp: 100 tablet, Rfl: 3 .  atorvastatin (LIPITOR) 80 MG tablet, Take 1 tablet (80 mg total) by mouth daily., Disp: 90 tablet, Rfl: 2 .  enalapril (VASOTEC) 10 MG tablet, Take 1 tablet (10 mg total) by mouth daily., Disp: 90 tablet, Rfl: 3 .  famotidine (PEPCID) 20 MG tablet, Take 1 tablet (20 mg total) by mouth 2 (two) times daily., Disp: 180 tablet, Rfl: 3 .  Lancets (FREESTYLE) lancets, Use as instructed, Disp: 100 each, Rfl: 12 .  metFORMIN (GLUCOPHAGE) 1000 MG tablet, Take 1 tablet (1,000 mg total) by mouth 2 (two) times daily with a meal., Disp: 180 tablet, Rfl: 2 .  NICOTINE STEP 2 14 MG/24HR patch, Place 1 patch (14 mg total) onto the skin daily., Disp: 90 patch, Rfl: 0 .  nitroGLYCERIN (NITROSTAT) 0.4 MG SL tablet, Place 1 tablet (0.4 mg total) under the tongue every 5 (five) minutes as needed for chest pain. If chest pain does not resolve after 2 tablets, please obtain evaluation in the ER., Disp: 100 tablet, Rfl: 0 .  sildenafil (VIAGRA) 50 MG tablet, Take 1 tablet (50 mg total) by mouth as needed for erectile dysfunction., Disp: 20 tablet, Rfl: 1 No Known Allergies Social History   Tobacco Use  Smoking Status Current Every Day Smoker  . Packs/day: 0.50  . Types: Cigarettes  Smokeless Tobacco Never Used  Tobacco Comment   Sometimes more.   Objective:  There were  no vitals filed for this visit. Constitutional Patient is a pleasant 54 y.o. African American male WD, WN in NAD.Marland Kitchen AAO x 3.  Vascular Capillary refill time to digits immediate b/l. Palpable pedal pulses b/l LE. Pedal hair sparse. Lower extremity skin temperature gradient within normal limits. No cyanosis or clubbing noted.  Neurologic Oriented to person, place, and time. Protective sensation decreased with 10 gram monofilament b/l. Vibratory sensation decreased b/l.  Dermatologic Pedal skin with normal turgor, texture and tone bilaterally. No open wounds bilaterally. No  interdigital macerations bilaterally. Toenails 1-5 left elongated, discolored, dystrophic, thickened, and crumbly with subungual debris and tenderness to dorsal palpation. Anonychia noted R 2nd toe. Nailbed(s) epithelialized.   Orthopedic: Normal muscle strength 5/5 to all lower extremity muscle groups bilaterally. No pain crepitus or joint limitation noted with ROM b/l. No gross bony deformities bilaterally.   Hemoglobin A1C Latest Ref Rng & Units 09/15/2020 06/17/2020 01/23/2020 10/28/2019  HGBA1C 4.0 - 5.6 % 7.5(A) 7.2(A) 5.8(A) 5.5  Some recent data might be hidden   Assessment:   1. Pain due to onychomycosis of toenails of both feet   2. Diabetic peripheral neuropathy associated with type 2 diabetes mellitus (HCC)    Plan:  Patient was evaluated and treated and all questions answered.  Onychomycosis with pain -Nails palliatively debridement as below. -Educated on self-care  Procedure: Nail Debridement Rationale: Pain Type of Debridement: manual, sharp debridement. Instrumentation: Nail nipper, rotary burr. Number of Nails: 9  -Examined patient. -Continue diabetic foot care principles. -Toenails 1-5 left, R hallux, R 3rd toe, R 4th toe and R 5th toe debrided in length and girth without iatrogenic bleeding with sterile nail nipper and dremel.  -Patient to report any pedal injuries to medical professional immediately. -Patient to continue soft, supportive shoe gear daily. -Patient/POA to call should there be question/concern in the interim.  Return in about 3 months (around 12/28/2020).  Freddie Breech, DPM

## 2020-10-02 DIAGNOSIS — Z419 Encounter for procedure for purposes other than remedying health state, unspecified: Secondary | ICD-10-CM | POA: Diagnosis not present

## 2020-10-11 ENCOUNTER — Other Ambulatory Visit: Payer: Self-pay | Admitting: Internal Medicine

## 2020-10-11 DIAGNOSIS — Z789 Other specified health status: Secondary | ICD-10-CM

## 2020-10-11 DIAGNOSIS — Z7289 Other problems related to lifestyle: Secondary | ICD-10-CM

## 2020-11-02 DIAGNOSIS — Z419 Encounter for procedure for purposes other than remedying health state, unspecified: Secondary | ICD-10-CM | POA: Diagnosis not present

## 2020-11-08 ENCOUNTER — Other Ambulatory Visit: Payer: Self-pay | Admitting: Internal Medicine

## 2020-11-08 DIAGNOSIS — G4700 Insomnia, unspecified: Secondary | ICD-10-CM

## 2020-11-10 ENCOUNTER — Other Ambulatory Visit: Payer: Self-pay

## 2020-11-10 MED ORDER — ACCU-CHEK AVIVA PLUS VI STRP: ORAL_STRIP | 12 refills | Status: DC

## 2020-11-10 NOTE — Telephone Encounter (Signed)
Returned call to patient's gf. Requesting refill on Accu-Chek Aviva test strips at [facility]. Confirmed with Calton Dach at [facility] that patient receives Accu-Chek Aviva Plus test strips. Last OV 09/15/2020 with PCP. Kinnie Feil, BSN, RN-BC

## 2020-11-10 NOTE — Telephone Encounter (Signed)
Pls contact pt [phone]

## 2020-11-20 ENCOUNTER — Other Ambulatory Visit: Payer: Self-pay

## 2020-11-20 ENCOUNTER — Emergency Department (HOSPITAL_COMMUNITY)
Admission: EM | Admit: 2020-11-20 | Discharge: 2020-11-20 | Disposition: A | Discharge status: Home / Self Care | Payer: Medicaid Other | Attending: Emergency Medicine | Admitting: Emergency Medicine

## 2020-11-20 ENCOUNTER — Encounter (HOSPITAL_COMMUNITY): Payer: Self-pay | Admitting: Emergency Medicine

## 2020-11-20 DIAGNOSIS — R1011 Right upper quadrant pain: Secondary | ICD-10-CM | POA: Insufficient documentation

## 2020-11-20 DIAGNOSIS — R197 Diarrhea, unspecified: Secondary | ICD-10-CM | POA: Diagnosis not present

## 2020-11-20 DIAGNOSIS — Z743 Need for continuous supervision: Secondary | ICD-10-CM | POA: Diagnosis not present

## 2020-11-20 DIAGNOSIS — R1084 Generalized abdominal pain: Secondary | ICD-10-CM | POA: Diagnosis not present

## 2020-11-20 DIAGNOSIS — Z5321 Procedure and treatment not carried out due to patient leaving prior to being seen by health care provider: Secondary | ICD-10-CM | POA: Insufficient documentation

## 2020-11-20 LAB — COMPREHENSIVE METABOLIC PANEL
ALT: 26 U/L (ref 0–44)
AST: 26 U/L (ref 15–41)
Albumin: 4.5 g/dL (ref 3.5–5.0)
Alkaline Phosphatase: 106 U/L (ref 38–126)
Anion gap: 14 (ref 5–15)
BUN: 10 mg/dL (ref 6–20)
CO2: 25 mmol/L (ref 22–32)
Calcium: 9.1 mg/dL (ref 8.9–10.3)
Chloride: 98 mmol/L (ref 98–111)
Creatinine, Ser: 1.09 mg/dL (ref 0.61–1.24)
GFR, Estimated: >60 mL/min (ref >60)
Glucose, Bld: 109 mg/dL — ABNORMAL HIGH (ref 70–99)
Potassium: 4.3 mmol/L (ref 3.5–5.1)
Sodium: 137 mmol/L (ref 135–145)
Total Bilirubin: 0.6 mg/dL (ref 0.3–1.2)
Total Protein: 7.7 g/dL (ref 6.5–8.1)

## 2020-11-20 LAB — CBC
HCT: 42.7 % (ref 39.0–52.0)
Hemoglobin: 14.6 g/dL (ref 13.0–17.0)
MCH: 27.3 pg (ref 26.0–34.0)
MCHC: 34.2 g/dL (ref 30.0–36.0)
MCV: 79.8 fL — ABNORMAL LOW (ref 80.0–100.0)
Platelets: 332 10*3/uL (ref 150–400)
RBC: 5.35 MIL/uL (ref 4.22–5.81)
RDW: 14.7 % (ref 11.5–15.5)
WBC: 10.9 10*3/uL — ABNORMAL HIGH (ref 4.0–10.5)
nRBC: 0 % (ref 0.0–0.2)

## 2020-11-20 LAB — LIPASE, BLOOD: Lipase: 27 U/L (ref 11–51)

## 2020-11-20 NOTE — ED Triage Notes (Signed)
Pt to triage via [facility] from home.  Reports RUQ pain with tenderness and diarrhea since this morning.  Denies nausea and vomiting.

## 2020-11-20 NOTE — ED Notes (Signed)
Patient stated he was leaving. 

## 2020-11-22 ENCOUNTER — Other Ambulatory Visit: Payer: Self-pay | Admitting: Student

## 2020-11-22 ENCOUNTER — Other Ambulatory Visit: Payer: Self-pay | Admitting: Internal Medicine

## 2020-11-22 DIAGNOSIS — K219 Gastro-esophageal reflux disease without esophagitis: Secondary | ICD-10-CM

## 2020-11-22 NOTE — Telephone Encounter (Signed)
Protonix discontinued last visit. Tried to reach out to patient and explain that and asks if symptoms's got worse. No answer and mail box set under another name.

## 2020-11-30 DIAGNOSIS — Z419 Encounter for procedure for purposes other than remedying health state, unspecified: Secondary | ICD-10-CM | POA: Diagnosis not present

## 2020-12-01 ENCOUNTER — Encounter: Payer: Self-pay | Admitting: Internal Medicine

## 2020-12-01 ENCOUNTER — Other Ambulatory Visit: Payer: Self-pay

## 2020-12-01 ENCOUNTER — Ambulatory Visit (INDEPENDENT_AMBULATORY_CARE_PROVIDER_SITE_OTHER): Payer: Medicaid Other | Admitting: Internal Medicine

## 2020-12-01 VITALS — BP 141/78 | HR 102 | Temp 98.4°F | Ht 73.0 in | Wt 207.6 lb

## 2020-12-01 DIAGNOSIS — I152 Hypertension secondary to endocrine disorders: Secondary | ICD-10-CM | POA: Diagnosis not present

## 2020-12-01 DIAGNOSIS — F17209 Nicotine dependence, unspecified, with unspecified nicotine-induced disorders: Secondary | ICD-10-CM

## 2020-12-01 DIAGNOSIS — E119 Type 2 diabetes mellitus without complications: Secondary | ICD-10-CM | POA: Diagnosis not present

## 2020-12-01 DIAGNOSIS — Z7189 Other specified counseling: Secondary | ICD-10-CM

## 2020-12-01 DIAGNOSIS — E1159 Type 2 diabetes mellitus with other circulatory complications: Secondary | ICD-10-CM | POA: Diagnosis not present

## 2020-12-01 DIAGNOSIS — J449 Chronic obstructive pulmonary disease, unspecified: Secondary | ICD-10-CM | POA: Diagnosis not present

## 2020-12-01 DIAGNOSIS — E781 Pure hyperglyceridemia: Secondary | ICD-10-CM

## 2020-12-01 DIAGNOSIS — R252 Cramp and spasm: Secondary | ICD-10-CM | POA: Insufficient documentation

## 2020-12-01 DIAGNOSIS — Z720 Tobacco use: Secondary | ICD-10-CM

## 2020-12-01 DIAGNOSIS — E611 Iron deficiency: Secondary | ICD-10-CM | POA: Diagnosis not present

## 2020-12-01 HISTORY — DX: Other specified counseling: Z71.89

## 2020-12-01 LAB — GLUCOSE, CAPILLARY: Glucose-Capillary: 189 mg/dL — ABNORMAL HIGH (ref 70–99)

## 2020-12-01 LAB — POCT GLYCOSYLATED HEMOGLOBIN (HGB A1C): Hemoglobin A1C: 7.6 % — AB (ref 4.0–5.6)

## 2020-12-01 MED ORDER — NICOTINE 21 MG/24HR TD PT24: 21.0000 mg | MEDICATED_PATCH | TRANSDERMAL | 0 refills | Status: DC

## 2020-12-01 NOTE — Assessment & Plan Note (Signed)
We reviewed the MOST form together at today's visit and it's implications. Patient expressed understanding and was able to ask appropriate questions throughout our discussion. He wishes to be DNR/DNI but otherwise full scope of care.

## 2020-12-01 NOTE — Assessment & Plan Note (Signed)
BMP and iron panel obtained at today's visit.

## 2020-12-01 NOTE — Assessment & Plan Note (Addendum)
Current medications: metformin 1000mg  BID Endorses medication compliance and denies adverse effects Last A1C in December was 7.5 A1C today is 7.6 Foot exam and retinal screening is UTD Plan -continue current management -encouraged increasing physical activity and diet adherence -follow up in 3 months -A1C goal <8%

## 2020-12-01 NOTE — Assessment & Plan Note (Addendum)
Current medications: enalapril 10mg  daily Endorses medication compliance. Denies adverse medication effects. Blood pressure is slightly above goal in the office today.  BP Readings from Last 3 Encounters:  12/01/20 (!) 141/78  11/20/20 (!) 127/96  09/15/20 119/89  Plan -BMP today -continue enalapril -f/u in 3 months. If blood pressure remains above goal at the next office visit, will consider medication adjustment.

## 2020-12-01 NOTE — Progress Notes (Signed)
Office Visit   Patient ID: Ruben Pierce, male    DOB: August 22, 1966, 55 y.o.   MRN: [id number]  Subjective:  CC: hypertension, diabetes, obstructive lung disease, AUD in remission, tobacco use disorder  HPI 55 y.o. presents today for follow up of his chronic medical conditions.   He also has an acute complaint of leg cramps x2w. They occur on and off throughout the day but fairly consistently wake him up around 4AM. They last <22min. He denies any neurological changes. No recent medication changes.      ACTIVE MEDICATIONS   Outpatient Medications Prior to Visit  Medication Sig Dispense Refill  . ADVAIR DISKUS 100-50 MCG/DOSE AEPB Inhale 1 puff into the lungs in the morning and at bedtime. 60 each 2  . albuterol (VENTOLIN HFA) 108 (90 Base) MCG/ACT inhaler INHALE 2 PUFFS INTO THE LUNGS EVERY 6 HOURS AS NEEDED FOR WHEEZING OR SHORTNESS OF BREATH 8.5 g 6  . amitriptyline (ELAVIL) 100 MG tablet Take 1 tablet (100 mg total) by mouth at bedtime. 30 tablet 3  . aspirin EC 325 MG tablet Take 1 tablet (325 mg total) by mouth daily. 100 tablet 3  . atorvastatin (LIPITOR) 80 MG tablet Take 1 tablet (80 mg total) by mouth daily. 90 tablet 2  . enalapril (VASOTEC) 10 MG tablet Take 1 tablet (10 mg total) by mouth daily. 90 tablet 3  . famotidine (PEPCID) 20 MG tablet Take 1 tablet (20 mg total) by mouth 2 (two) times daily. 180 tablet 3  . glucose blood (ACCU-CHEK AVIVA PLUS) test strip Use as instructed 100 each 12  . Lancets (FREESTYLE) lancets Use as instructed 100 each 12  . metFORMIN (GLUCOPHAGE) 1000 MG tablet Take 1 tablet (1,000 mg total) by mouth 2 (two) times daily with a meal. 180 tablet 2  . nitroGLYCERIN (NITROSTAT) 0.4 MG SL tablet Place 1 tablet (0.4 mg total) under the tongue every 5 (five) minutes as needed for chest pain. If chest pain does not resolve after 2 tablets, please obtain evaluation in the ER. 100 tablet 0  . sildenafil (VIAGRA) 50 MG tablet Take 1 tablet (50 mg total) by  mouth as needed for erectile dysfunction. 20 tablet 1  . NICOTINE STEP 2 14 MG/24HR patch Place 1 patch (14 mg total) onto the skin daily. 84 patch 0   No facility-administered medications prior to visit.     ROS  Review of Systems  Respiratory: Positive for chest tightness and wheezing. Negative for shortness of breath.   Cardiovascular: Positive for chest pain. Negative for palpitations and leg swelling.  Neurological: Negative for dizziness, light-headedness and headaches.  Psychiatric/Behavioral: The patient is not nervous/anxious.     Objective:   BP (!) 141/78 (BP Location: Right Arm, Patient Position: Sitting, Cuff Size: Normal)   Pulse (!) 102   Temp 98.4 F (36.9 C) (Oral)   Ht 6\' 1"  (1.854 m)   Wt 207 lb 9.6 oz (94.2 kg)   SpO2 98%   BMI 27.39 kg/m  Wt Readings from Last 3 Encounters:  12/01/20 207 lb 9.6 oz (94.2 kg)  09/15/20 200 lb 6.4 oz (90.9 kg)  06/28/20 196 lb (88.9 kg)   BP Readings from Last 3 Encounters:  12/01/20 (!) 141/78  11/20/20 (!) 127/96  09/15/20 119/89   Physical Exam Constitutional:      Appearance: Normal appearance.  Cardiovascular:     Rate and Rhythm: Normal rate and regular rhythm.  Pulmonary:     Effort: Pulmonary effort  is normal.     Comments: Expiratory wheezes throughout Musculoskeletal:        General: No swelling.  Neurological:     Mental Status: He is alert.  Psychiatric:        Mood and Affect: Mood normal.        Thought Content: Thought content normal.     Health Maintenance:   Health Maintenance  Topic Date Due  . COVID-19 Vaccine (1) Never done  . HEMOGLOBIN A1C  03/03/2021  . FOOT EXAM  03/23/2021  . OPHTHALMOLOGY EXAM  03/29/2021  . TETANUS/TDAP  03/29/2029  . COLONOSCOPY (Pts 45-25yrs Insurance coverage will need to be confirmed)  11/26/2029  . INFLUENZA VACCINE  Completed  . PNEUMOCOCCAL POLYSACCHARIDE VACCINE AGE 31-64 HIGH RISK  Completed  . Hepatitis C Screening  Completed  . HIV Screening   Completed  . HPV VACCINES  Aged Out     Assessment & Plan:   Problem List Items Addressed This Visit      Cardiovascular and Mediastinum   Hypertension associated with diabetes (HCC) (Chronic)    Current medications: enalapril 10mg  daily Endorses medication compliance. Denies adverse medication effects. Blood pressure is slightly above goal in the office today.  BP Readings from Last 3 Encounters:  12/01/20 (!) 141/78  11/20/20 (!) 127/96  09/15/20 119/89  Plan -BMP today -continue enalapril -f/u in 3 months. If blood pressure remains above goal at the next office visit, will consider medication adjustment.        Respiratory   Obstructive lung disease (HCC)    COPD follow up: Current medications: albuterol inhaler Have you taken medication(s) today: yes Med Adherence:  [x]  Yes    []  No Medication side effects:  []  Yes    [x]  No  Symptom control: []  Yes    [x]  No  # of exacerbations in past 53mo: 0 Smoking history: [x]  Yes    []  No  Currently smoking: [x]  Yes    []  No    We attempted to get him advair at the last office visit but it was not being covered by insurance as he needed PFTs done prior. We had discussed this at his last 2 visits however he has not followed through with getting these done.   He does not appear to be in an exacerbation today.  Plan -PFTs reordered. We had an extensive discussion regarding the need to get these done and he expressed understanding -will have him follow up in the office after these are complete so we can move forward with stepping up his medications -continue to encourage smoking cessation. 21mg  nicoderm patch ordered        Relevant Orders   Pulmonary function test     Endocrine   Type 2 diabetes mellitus (HCC) - Primary (Chronic)    Current medications: metformin 1000mg  BID Endorses medication compliance and denies adverse effects Last A1C in December was 7.5 A1C today is 7.6 Foot exam and retinal screening is  UTD Plan -continue current management -encouraged increasing physical activity and diet adherence -follow up in 3 months -A1C goal <8%      Relevant Orders   POC Hbg A1C (Completed)   Microalbumin / Creatinine Urine Ratio     Other   Tobacco abuse (Chronic)    Continues to smoke around 0.5 ppd. He notes that he is working on smoking cessation, along with his brother and sister in . He is not getting much craving relief from the 14mg  nicoderm patches.  Plan -increase nicoderm patch to 21mg  -LDCT for lung cancer screening ordered at a prior visit however he has not gotten this done. I would prefer we focus on getting the PFTs done for now and can revisit getting the CT once those are complete. -continue to encourage smoking cessation      Counseling regarding advanced directives and goals of care    We reviewed the MOST form together at today's visit and it's implications. Patient expressed understanding and was able to ask appropriate questions throughout our discussion. He wishes to be DNR/DNI but otherwise full scope of care.       Leg cramps    BMP and iron panel obtained at today's visit.       Relevant Orders   Basic metabolic panel   Iron, TIBC and Ferritin Panel    Other Visit Diagnoses    Tobacco use disorder, continuous       Relevant Medications   nicotine (NICODERM CQ) 21 mg/24hr patch        Pt discussed with Dr. , MD Internal Medicine Resident PGY-2 [facility] Pager: [phone] 12/01/2020 4:46 PM

## 2020-12-01 NOTE — Assessment & Plan Note (Signed)
Continues to smoke around 0.5 ppd. He notes that he is working on smoking cessation, along with his brother and sister in [employer]. He is not getting much craving relief from the 14mg  nicoderm patches.  Plan -increase nicoderm patch to 21mg  -LDCT for lung cancer screening ordered at a prior visit however he has not gotten this done. I would prefer we focus on getting the PFTs done for now and can revisit getting the CT once those are complete. -continue to encourage smoking cessation

## 2020-12-01 NOTE — Assessment & Plan Note (Addendum)
COPD follow up: Current medications: albuterol inhaler Have you taken medication(s) today: yes Med Adherence:  [x]  Yes    []  No Medication side effects:  []  Yes    [x]  No  Symptom control: []  Yes    [x]  No  # of exacerbations in past 88mo: 0 Smoking history: [x]  Yes    []  No  Currently smoking: [x]  Yes    []  No    We attempted to get him advair at the last office visit but it was not being covered by insurance as he needed PFTs done prior. We had discussed this at his last 2 visits however he has not followed through with getting these done.   He does not appear to be in an exacerbation today.  Plan -PFTs reordered. We had an extensive discussion regarding the need to get these done and he expressed understanding -will have him follow up in the office after these are complete so we can move forward with stepping up his medications -continue to encourage smoking cessation. 21mg  nicoderm patch ordered

## 2020-12-02 ENCOUNTER — Other Ambulatory Visit: Payer: Self-pay | Admitting: Internal Medicine

## 2020-12-02 LAB — MICROALBUMIN / CREATININE URINE RATIO
Creatinine, Urine: 48.2 mg/dL
Microalb/Creat Ratio: 21 mg/g creat (ref 0–29)
Microalbumin, Urine: 10.2 ug/mL

## 2020-12-02 LAB — BASIC METABOLIC PANEL
BUN/Creatinine Ratio: 7 — ABNORMAL LOW (ref 9–20)
BUN: 6 mg/dL (ref 6–24)
CO2: 21 mmol/L (ref 20–29)
Calcium: 9.3 mg/dL (ref 8.7–10.2)
Chloride: 99 mmol/L (ref 96–106)
Creatinine, Ser: 0.83 mg/dL (ref 0.76–1.27)
Glucose: 196 mg/dL — ABNORMAL HIGH (ref 65–99)
Potassium: 4.8 mmol/L (ref 3.5–5.2)
Sodium: 138 mmol/L (ref 134–144)
eGFR: 105 mL/min/{1.73_m2} (ref >59)

## 2020-12-02 LAB — IRON,TIBC AND FERRITIN PANEL
Ferritin: 27 ng/mL — ABNORMAL LOW (ref 30–400)
Iron Saturation: 11 % — ABNORMAL LOW (ref 15–55)
Iron: 39 ug/dL (ref 38–169)
Total Iron Binding Capacity: 367 ug/dL (ref 250–450)
UIBC: 328 ug/dL (ref 111–343)

## 2020-12-02 NOTE — Addendum Note (Signed)
Addended by: Elige Radon on: 12/02/2020 10:50 AM   Modules accepted: Orders

## 2020-12-03 ENCOUNTER — Other Ambulatory Visit: Payer: Self-pay | Admitting: Internal Medicine

## 2020-12-03 DIAGNOSIS — Z122 Encounter for screening for malignant neoplasm of respiratory organs: Secondary | ICD-10-CM

## 2020-12-03 DIAGNOSIS — Z720 Tobacco use: Secondary | ICD-10-CM

## 2020-12-03 LAB — SPECIMEN STATUS REPORT

## 2020-12-03 LAB — LIPID PANEL
Chol/HDL Ratio: 7.3 ratio — ABNORMAL HIGH (ref 0.0–5.0)
Cholesterol, Total: 234 mg/dL — ABNORMAL HIGH (ref 100–199)
HDL: 32 mg/dL — ABNORMAL LOW (ref >39)
Triglycerides: 1078 mg/dL (ref 0–149)

## 2020-12-06 ENCOUNTER — Other Ambulatory Visit: Payer: Self-pay | Admitting: Dietician

## 2020-12-06 DIAGNOSIS — E119 Type 2 diabetes mellitus without complications: Secondary | ICD-10-CM

## 2020-12-06 NOTE — Progress Notes (Signed)
Internal Medicine Clinic Attending  Case discussed with Dr. Ephriam Knuckles  At the time of the visit.  We reviewed the resident's history and exam and pertinent patient test results.  I agree with the assessment, diagnosis, and plan of care documented in the resident's note. Repeat iron labs show he remains deficent, should resume oral iron replacement, he is due for repeat screening colonoscopy due to last colonoscopy incomplete visualization.

## 2020-12-06 NOTE — Progress Notes (Signed)
Referral for nutrition education

## 2020-12-08 DIAGNOSIS — E611 Iron deficiency: Secondary | ICD-10-CM | POA: Insufficient documentation

## 2020-12-08 DIAGNOSIS — E781 Pure hyperglyceridemia: Secondary | ICD-10-CM | POA: Insufficient documentation

## 2020-12-08 NOTE — Assessment & Plan Note (Addendum)
Lipid panel from this visit shows severe hypertriglyceridemia. I do not believe the lipid panel was fasting. He does also have a history of alcohol use but he reports he is no longer drinking.  Lipid Panel     Component Value Date/Time   CHOL 234 (H) 12/01/2020 1411   TRIG 1,078 (HH) 12/01/2020 1411   HDL 32 (L) 12/01/2020 1411   CHOLHDL 7.3 (H) 12/01/2020 1411   LDLCALC Comment (A) 12/01/2020 1411   LABVLDL Comment (A) 12/01/2020 1411   I have been attempting to discuss this with Ruben Pierce however have not been able to contact him via telephone.  Will ensure that he is taking his 80mg  lipitor. When the TG level is >1000 mg/dL, drugs used to lower TG have limited effectiveness. These agents work primarily by reducing hepatic TG synthesis and secretion as VLDL-TG and thus are relatively ineffective when TG level is severely elevated. Once TG<1000, can consider starting fenofibrate given his cardiovascular risk. Will have him return for repeat labs later this month.  I have also asked , our clinic dietician, to discuss diet with him.

## 2020-12-08 NOTE — Assessment & Plan Note (Signed)
I obtained an iron panel at this visit for cramping in his legs. Findings are consistent with iron deficiency. He is not anemic but MCV is borderline microcytic. He has no obvious source of blood loss and he reports a normal diet. He is due for another colonoscopy due to poor visualization on the prior one. Iron/TIBC/Ferritin/ %Sat    Component Value Date/Time   IRON 39 12/01/2020 1411   TIBC 367 12/01/2020 1411   FERRITIN 27 (L) 12/01/2020 1411   IRONPCTSAT 11 (L) 12/01/2020 1411   Plan -I am attempting to contact Maurine Minister to discuss this and other lab results but have been unable to reach him. Will recommend that he undergo another colonoscopy given this iron deficiency, as his prior colonoscopy had limited visualization. Will also encourage him to start iron supplementation.

## 2020-12-14 ENCOUNTER — Ambulatory Visit (HOSPITAL_BASED_OUTPATIENT_CLINIC_OR_DEPARTMENT_OTHER): Admission: RE | Admit: 2020-12-14 | Payer: Medicaid Other | Source: Ambulatory Visit

## 2020-12-28 ENCOUNTER — Ambulatory Visit (INDEPENDENT_AMBULATORY_CARE_PROVIDER_SITE_OTHER): Payer: Medicaid Other | Admitting: Internal Medicine

## 2020-12-28 ENCOUNTER — Encounter: Payer: Self-pay | Admitting: Internal Medicine

## 2020-12-28 VITALS — BP 137/76 | HR 113 | Temp 98.3°F | Ht 73.0 in | Wt 206.7 lb

## 2020-12-28 DIAGNOSIS — E781 Pure hyperglyceridemia: Secondary | ICD-10-CM | POA: Diagnosis not present

## 2020-12-28 DIAGNOSIS — G4700 Insomnia, unspecified: Secondary | ICD-10-CM | POA: Diagnosis not present

## 2020-12-28 DIAGNOSIS — J449 Chronic obstructive pulmonary disease, unspecified: Secondary | ICD-10-CM

## 2020-12-28 DIAGNOSIS — M7711 Lateral epicondylitis, right elbow: Secondary | ICD-10-CM

## 2020-12-28 DIAGNOSIS — K219 Gastro-esophageal reflux disease without esophagitis: Secondary | ICD-10-CM | POA: Diagnosis not present

## 2020-12-28 MED ORDER — IPRATROPIUM BROMIDE HFA 17 MCG/ACT IN AERS: 2.0000 | INHALATION_SPRAY | Freq: Four times a day (QID) | RESPIRATORY_TRACT | 2 refills | Status: DC

## 2020-12-28 NOTE — Progress Notes (Signed)
Office Visit   Patient ID: Ruben Pierce, male    DOB: 1965-10-07, 55 y.o.   MRN: [id number]  Subjective:  CC: right elbow pain  Ruben Pierce is a 55 y.o. year old male who presents for evaluation of 1-2w history of right elbow pain. The pain is located on the lateral aspect of the elbow. No preceding trauma however he does note that he was out mowing grass the day prior to the onset. Pain is exacerbated by forearm pronation. He has not tried anything to improve the pain. Denies fever or chills. No prior similar symptoms.      ACTIVE MEDICATIONS   Outpatient Medications Prior to Visit  Medication Sig Dispense Refill  . aspirin EC 325 MG tablet Take 1 tablet (325 mg total) by mouth daily. 100 tablet 3  . atorvastatin (LIPITOR) 80 MG tablet Take 1 tablet (80 mg total) by mouth daily. 90 tablet 2  . enalapril (VASOTEC) 10 MG tablet Take 1 tablet (10 mg total) by mouth daily. 90 tablet 3  . glucose blood (ACCU-CHEK AVIVA PLUS) test strip Use as instructed 100 each 12  . Lancets (FREESTYLE) lancets Use as instructed 100 each 12  . metFORMIN (GLUCOPHAGE) 1000 MG tablet Take 1 tablet (1,000 mg total) by mouth 2 (two) times daily with a meal. 180 tablet 2  . nicotine (NICODERM CQ) 21 mg/24hr patch Place 1 patch (21 mg total) onto the skin daily. 28 patch 0  . nitroGLYCERIN (NITROSTAT) 0.4 MG SL tablet Place 1 tablet (0.4 mg total) under the tongue every 5 (five) minutes as needed for chest pain. If chest pain does not resolve after 2 tablets, please obtain evaluation in the ER. 100 tablet 0  . sildenafil (VIAGRA) 50 MG tablet Take 1 tablet (50 mg total) by mouth as needed for erectile dysfunction. 20 tablet 1  . ADVAIR DISKUS 100-50 MCG/DOSE AEPB Inhale 1 puff into the lungs in the morning and at bedtime. 60 each 2  . albuterol (VENTOLIN HFA) 108 (90 Base) MCG/ACT inhaler INHALE 2 PUFFS INTO THE LUNGS EVERY 6 HOURS AS NEEDED FOR WHEEZING OR SHORTNESS OF BREATH 8.5 g 6  . amitriptyline (ELAVIL)  100 MG tablet Take 1 tablet (100 mg total) by mouth at bedtime. 30 tablet 3  . famotidine (PEPCID) 20 MG tablet Take 1 tablet (20 mg total) by mouth 2 (two) times daily. 180 tablet 3   No facility-administered medications prior to visit.     Objective:   BP 137/76 (BP Location: Left Arm, Patient Position: Sitting, Cuff Size: Small)   Pulse (!) 113   Temp 98.3 F (36.8 C) (Oral)   Ht 6\' 1"  (1.854 m)   Wt 206 lb 11.2 oz (93.8 kg)   SpO2 100%   BMI 27.27 kg/m  Wt Readings from Last 3 Encounters:  12/28/20 206 lb 11.2 oz (93.8 kg)  12/01/20 207 lb 9.6 oz (94.2 kg)  09/15/20 200 lb 6.4 oz (90.9 kg)   BP Readings from Last 3 Encounters:  12/28/20 137/76  12/01/20 (!) 141/78  11/20/20 (!) 127/96   Physical exam General: well appearing in NAD Right elbow: no obvious deformity although there is some noticeable swelling on the lateral aspect. No erythema. Mild-moderate tenderness to palpation of the lateral epicondyl. Not fluctuant. Passive ROM intact. Pain exacerbated by pronation against resistance.   Health Maintenance:   Health Maintenance  Topic Date Due  . COVID-19 Vaccine (1) Never done  . HEMOGLOBIN A1C  03/03/2021  . FOOT  EXAM  03/23/2021  . OPHTHALMOLOGY EXAM  03/29/2021  . TETANUS/TDAP  03/29/2029  . COLONOSCOPY (Pts 45-59yrs Insurance coverage will need to be confirmed)  11/26/2029  . INFLUENZA VACCINE  Completed  . PNEUMOCOCCAL POLYSACCHARIDE VACCINE AGE 17-64 HIGH RISK  Completed  . Hepatitis C Screening  Completed  . HIV Screening  Completed  . HPV VACCINES  Aged Out     Assessment & Plan:   Problem List Items Addressed This Visit      Respiratory   Obstructive lung disease (HCC)    PFTs that I ordered on 3/2 have not yet been set up. I have asked our office to look into this. I would like to confirm the presence of and see the severity of his COPD prior to starting maintenance therapy. He is also requesting an inhaler refill. He has both albuterol and  atrovent on his med list.  Plan: will discontinue the albuterol and continue atrovent as it seems more beneficial in COPD. Will hopefully get his PFTs done soon and then will start him on maintenance therapy. atrovent refills sent to pharmacy        Digestive   GERD (gastroesophageal reflux disease)    Discussed weaning off famotidine to see how his symptoms are. He has been asymptomatic on the famotidine so I am hopeful that his symptoms may resolve since abstaining from alcohol.  -Will continue to plan to refer to GI if symptoms return.        Endocrine   Hyperlipidemia associated with type 2 diabetes mellitus (HCC) (Chronic)     Musculoskeletal and Integument   Lateral epicondylitis, right elbow    This encounter was for evaluation of 1-2w history of right lateral elbow pain. History and exam are consistent with alteral epicondylitis which I suspect was brought on by mowing grass.  No s/s of infection at this time. POC US shows soft tissue inflammatory changes of the lateral elbow without effusion. Plan -will start with conservative measures for now with rest, voltaren gel and a tennis elbow brace -if symptoms do not improve in 2-4 weeks, he will return at which time we can refer him to sports medicine -discussed that if he develops fever or chills or other sign of infection, he should seek immediate medical evaluation        Other   Insomnia (Chronic)    No longer using amitriptyline at night. Will discontinue this on his med list.       Severe Hypertriglyceridemia - Primary    Will obtain repeat lipid panel for re-evaluation of this.      Relevant Orders   Lipid panel       Return in about 3 months (around 03/30/2021).   Pt discussed with Dr. Phyllis Ginger, MD Internal Medicine Resident PGY-2 [facility] Pager: [phone] 12/28/2020 5:24 PM

## 2020-12-28 NOTE — Assessment & Plan Note (Signed)
This encounter was for evaluation of 1-2w history of right lateral elbow pain. History and exam are consistent with alteral epicondylitis which I suspect was brought on by mowing grass.  No s/s of infection at this time. POC US shows soft tissue inflammatory changes of the lateral elbow without effusion. Plan -will start with conservative measures for now with rest, voltaren gel and a tennis elbow brace -if symptoms do not improve in 2-4 weeks, he will return at which time we can refer him to sports medicine -discussed that if he develops fever or chills or other sign of infection, he should seek immediate medical evaluation

## 2020-12-28 NOTE — Assessment & Plan Note (Deleted)
Repeat lipid panel obtained at today's visit.

## 2020-12-28 NOTE — Assessment & Plan Note (Addendum)
A repeat lipid panel today shows that the triglycerides are essentially unchanged and remain >1000. This is despite this being fasting.  Its still not clear to me what has caused the sudden change. He is already on max dose lipitor 80mg .  Plan -will have him return in one month for a repeat lipid panel -if triglycerides are still >1000 at that time, will refer to the lipid clinic for assistance in treatment of this since, as stated in my note from 3/9, triglyceride lower agents are generally ineffective until triglycerides are <1000 -if triglycerides are <1000 but are elevated, then I would have him start fenofibrate

## 2020-12-28 NOTE — Assessment & Plan Note (Signed)
No longer using amitriptyline at night. Will discontinue this on his med list.

## 2020-12-28 NOTE — Assessment & Plan Note (Signed)
PFTs that I ordered on 3/2 have not yet been set up. I have asked our office to look into this. I would like to confirm the presence of and see the severity of his COPD prior to starting maintenance therapy. He is also requesting an inhaler refill. He has both albuterol and atrovent on his med list.  Plan: will discontinue the albuterol and continue atrovent as it seems more beneficial in COPD. Will hopefully get his PFTs done soon and then will start him on maintenance therapy. atrovent refills sent to pharmacy

## 2020-12-28 NOTE — Assessment & Plan Note (Signed)
Discussed weaning off famotidine to see how his symptoms are. He has been asymptomatic on the famotidine so I am hopeful that his symptoms may resolve since abstaining from alcohol.  -Will continue to plan to refer to GI if symptoms return.

## 2020-12-29 ENCOUNTER — Telehealth: Payer: Self-pay | Admitting: *Deleted

## 2020-12-29 ENCOUNTER — Ambulatory Visit: Payer: Medicaid Other | Admitting: Podiatry

## 2020-12-29 ENCOUNTER — Encounter: Payer: Self-pay | Admitting: *Deleted

## 2020-12-29 LAB — LIPID PANEL
Chol/HDL Ratio: 8 ratio — ABNORMAL HIGH (ref 0.0–5.0)
Cholesterol, Total: 281 mg/dL — ABNORMAL HIGH (ref 100–199)
HDL: 35 mg/dL — ABNORMAL LOW (ref >39)
Triglycerides: 1087 mg/dL (ref 0–149)

## 2020-12-29 NOTE — Progress Notes (Signed)
I attempted to call Ruben Pierce regarding the lab results but call went to his voicemail. If he calls, please let him know that his triglycerides are still elevated and that I want him to come back in a month to have them rechecked (lab only)

## 2020-12-29 NOTE — Telephone Encounter (Signed)
Thank you for doing this Lauren!

## 2020-12-29 NOTE — Telephone Encounter (Signed)
Called again, patient is sleeping. EC took down info below. Stressed importance of keeping these appts or calling Cordelia Pen in Respiratory at [phone] to r/s. EC was able to repeat back dates, times, locations, and phone number correctly.

## 2020-12-29 NOTE — Telephone Encounter (Signed)
Spoke with Cordelia Pen in Respiratory to schedule patient for PFTs. PFTs can be done Tuesday, 01/04/2021 at 1000. Patient will need to have Covid test on Friday, 12/31/2020 at 1000 at [address]. Attempted to notify patient. EC answered phone and said she's in a drive-thru and will call back.

## 2020-12-31 ENCOUNTER — Other Ambulatory Visit (HOSPITAL_COMMUNITY): Payer: Medicaid Other

## 2020-12-31 DIAGNOSIS — Z419 Encounter for procedure for purposes other than remedying health state, unspecified: Secondary | ICD-10-CM | POA: Diagnosis not present

## 2021-01-01 NOTE — Progress Notes (Signed)
Internal Medicine Clinic Attending  Case discussed with Dr. Christian  At the time of the visit.  We reviewed the resident's history and exam and pertinent patient test results.  I agree with the assessment, diagnosis, and plan of care documented in the resident's note.  

## 2021-01-04 ENCOUNTER — Inpatient Hospital Stay (HOSPITAL_COMMUNITY): Admission: RE | Admit: 2021-01-04 | Payer: Medicaid Other | Source: Ambulatory Visit

## 2021-01-05 ENCOUNTER — Ambulatory Visit (INDEPENDENT_AMBULATORY_CARE_PROVIDER_SITE_OTHER): Payer: Medicaid Other | Admitting: Dietician

## 2021-01-05 ENCOUNTER — Encounter: Payer: Self-pay | Admitting: Dietician

## 2021-01-05 DIAGNOSIS — Z713 Dietary counseling and surveillance: Secondary | ICD-10-CM | POA: Diagnosis not present

## 2021-01-05 DIAGNOSIS — E119 Type 2 diabetes mellitus without complications: Secondary | ICD-10-CM

## 2021-01-05 DIAGNOSIS — Z7984 Long term (current) use of oral hypoglycemic drugs: Secondary | ICD-10-CM

## 2021-01-05 NOTE — Progress Notes (Signed)
Diabetes Self-Management Education  Visit Type: First/Initial  Appt. Start Time: 0930 Appt. End Time: 1000  01/05/2021  [name], identified by name and date of birth, is a 55 y.o. male with a diagnosis of Diabetes: Type 2.   ASSESSMENT  He states his usual bodyweight is 180# but he has not weighed that in a while. Takes metformin without missing doses. Walks daily He reports drinking juice and at least 9 cans of soda per day. He was advised to drink more water and stop/decrease soda and juice due to his extremely high triglycerides.    Lab Results  Component Value Date   HGBA1C 7.6 (A) 12/01/2020   HGBA1C 7.5 (A) 09/15/2020   HGBA1C 7.2 (A) 06/17/2020   HGBA1C 5.8 (A) 01/23/2020   HGBA1C 5.5 10/28/2019    Wt Readings from Last 10 Encounters:  01/05/21 203 lb 4.8 oz (92.2 kg)  12/28/20 206 lb 11.2 oz (93.8 kg)  12/01/20 207 lb 9.6 oz (94.2 kg)  09/15/20 200 lb 6.4 oz (90.9 kg)  06/28/20 196 lb (88.9 kg)  06/17/20 196 lb 8 oz (89.1 kg)  01/23/20 184 lb 8 oz (83.7 kg)  11/27/19 172 lb (78 kg)  11/13/19 172 lb (78 kg)  10/28/19 169 lb 1.6 oz (76.7 kg)   BP Readings from Last 3 Encounters:  12/28/20 137/76  12/01/20 (!) 141/78  11/20/20 (!) 127/96   Weight 203 lb 4.8 oz (92.2 kg). Body mass index is 26.82 kg/m.   Diabetes Self-Management Education - 01/05/21 1000       Visit Information   Visit Type First/Initial      Initial Visit   Diabetes Type Type 2    Are you currently following a meal plan? No    Are you taking your medications as prescribed? Yes    Date Diagnosed 2009   denies previous diabetes education     Health Coping   How would you rate your overall health? 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 4 - Often    What is the last grade level you completed in school? 9th grade      Pre-Education Assessment   Patient understands the diabetes disease and treatment process. Demonstrates understanding / competency    Patient understands incorporating nutritional management into lifestyle. Needs Instruction    Patient undertands incorporating physical activity into lifestyle. Demonstrates understanding / competency    Patient understands using medications safely. Demonstrates understanding / competency    Patient understands monitoring blood glucose, interpreting and using results Needs Instruction    Patient understands prevention, detection, and treatment of acute complications. Needs Instruction    Patient understands prevention, detection, and treatment of chronic complications. Needs Instruction    Patient understands how to develop strategies to address psychosocial issues. Demonstrates understanding / competency  Patient understands how to develop strategies to promote health/change behavior. Demonstrates understanding / competency      Complications   Last HgB A1C per patient/outside source 7.6 %    How often do you check your blood sugar? 0 times/day (not testing)    Number of hypoglycemic episodes per month 0    Number of hyperglycemic episodes per week 0   unknown   Have you had a dilated eye exam in the past 12 months? Yes    Have you had a dental exam in the past 12 months? No   he does not have teeth   Are you checking your feet? Yes     How many days per week are you checking your feet? 7      Dietary Intake   Breakfast biscuitevuille oatmeal or bowl of cereal or leftovers or biscuit    Beverage(s) water, juice, milk, 9 cans of soda per day      Exercise   Exercise Type ADL's;Light (walking / raking leaves)    How many days per week to you exercise? 7   friend says he " walks my pitbull to death"   How many minutes per day do you exercise? 30    Total minutes per week of exercise 210      Patient Education   Previous Diabetes Education No    Nutrition management  Meal options for control of blood glucose level and chronic complications.;Role of diet in the treatment of diabetes and the relationship between the three main macronutrients and blood glucose level    Monitoring Identified appropriate SMBG and/or A1C goals.    Acute complications Discussed and identified patients' treatment of hyperglycemia.      Individualized Goals (developed by patient)   Nutrition Follow meal plan discussed      Outcomes   Expected Outcomes Demonstrated interest in learning. Expect positive outcomes    Future DMSE 2 wks    Program Status Not Completed             Individualized Plan for Diabetes Self-Management Training:   Learning Objective:  Patient will have a greater understanding of diabetes self-management. Patient education plan is to attend individual and/or group sessions per assessed needs and concerns.   Plan:   Patient Instructions  Please make a LAB appointment for a morning in the next 2 weeks to have your LIPID panel repeated. Please come fasting- do not drink or eat before you come.    To help you blood sugars and blood fats:   Drink mostly water- at least 4-6 cups a day, 2 cups of milk and 1-2 cups each of tea and coffee, and maybe 1/2 cup juice per day.   We can follow up on the same day you have labs to see how you are doing with your drinks.   Walk after meals as best you can.   Lupita Leash [phone]    Expected Outcomes:  Demonstrated interest in learning. Expect positive outcomes Education material provided: Diabetes Resources If problems or questions, patient to contact team via:  Phone Future DSME appointment: 2 wks  Norm Parcel, RD 01/05/2021 10:34 AM.

## 2021-01-05 NOTE — Patient Instructions (Signed)
Please make a LAB appointment for a morning in the next 2 weeks to have your LIPID panel repeated. Please come fasting- do not drink or eat before you come.    To help you blood sugars and blood fats:   Drink mostly water- at least 4-6 cups a day, 2 cups of milk and 1-2 cups each of tea and coffee, and maybe 1/2 cup juice per day.   We can follow up on the same day you have labs to see how you are doing with your drinks.   Walk after meals as best you can.   Lupita Leash [phone]

## 2021-01-11 ENCOUNTER — Telehealth: Payer: Self-pay | Admitting: Dietician

## 2021-01-11 NOTE — Telephone Encounter (Signed)
[employer] called and asked for call back. She is asking for a Covid test for Mr. Pineda before his appointment Friday. She thinks he needs a referral. I returned cal and left a message for her to call her closest pharmacy to get an appointment for a test.

## 2021-01-14 ENCOUNTER — Inpatient Hospital Stay (HOSPITAL_COMMUNITY): Admission: RE | Admit: 2021-01-14 | Payer: Medicaid Other | Source: Ambulatory Visit

## 2021-01-14 DIAGNOSIS — Z20822 Contact with and (suspected) exposure to covid-19: Secondary | ICD-10-CM | POA: Diagnosis not present

## 2021-01-17 ENCOUNTER — Inpatient Hospital Stay (HOSPITAL_COMMUNITY): Admission: RE | Admit: 2021-01-17 | Payer: Medicaid Other | Source: Ambulatory Visit

## 2021-01-19 ENCOUNTER — Other Ambulatory Visit: Payer: Self-pay | Admitting: Internal Medicine

## 2021-01-19 DIAGNOSIS — F17209 Nicotine dependence, unspecified, with unspecified nicotine-induced disorders: Secondary | ICD-10-CM

## 2021-01-28 ENCOUNTER — Ambulatory Visit (INDEPENDENT_AMBULATORY_CARE_PROVIDER_SITE_OTHER): Payer: Medicaid Other

## 2021-01-28 ENCOUNTER — Ambulatory Visit (INDEPENDENT_AMBULATORY_CARE_PROVIDER_SITE_OTHER): Payer: Medicaid Other | Admitting: Podiatry

## 2021-01-28 ENCOUNTER — Other Ambulatory Visit: Payer: Self-pay

## 2021-01-28 DIAGNOSIS — S90851A Superficial foreign body, right foot, initial encounter: Secondary | ICD-10-CM

## 2021-01-28 DIAGNOSIS — L03115 Cellulitis of right lower limb: Secondary | ICD-10-CM

## 2021-01-28 DIAGNOSIS — E1142 Type 2 diabetes mellitus with diabetic polyneuropathy: Secondary | ICD-10-CM

## 2021-01-28 DIAGNOSIS — L02619 Cutaneous abscess of unspecified foot: Secondary | ICD-10-CM

## 2021-01-28 DIAGNOSIS — B351 Tinea unguium: Secondary | ICD-10-CM

## 2021-01-28 DIAGNOSIS — M79674 Pain in right toe(s): Secondary | ICD-10-CM

## 2021-01-28 MED ORDER — DOXYCYCLINE HYCLATE 100 MG PO TABS: 100.0000 mg | ORAL_TABLET | Freq: Two times a day (BID) | ORAL | 0 refills | Status: DC

## 2021-01-28 NOTE — Progress Notes (Signed)
This patient returns to my office for at risk foot care.  This patient requires this care by a professional since this patient will be at risk due to having type 2 diabetes.  This patient is unable to cut nails himself since the patient cannot reach his nails.These nails are painful walking and wearing shoes. He also says he stepped on a piece of glass about three weeks ago.  He says the area has become red and painful and now walks on the outside of his right foot. This patient presents for at risk foot care today.  General Appearance  Alert, conversant and in no acute stress.  Vascular  Dorsalis pedis and posterior tibial  pulses are palpable  bilaterally.  Capillary return is within normal limits  bilaterally. Temperature is within normal limits  bilaterally.  Neurologic  Senn-Weinstein monofilament wire test within normal limits  bilaterally. Muscle power within normal limits bilaterally.  Nails Thick disfigured discolored nails with subungual debris  from hallux to fifth toes bilaterally. No evidence of bacterial infection or drainage bilaterally.  Orthopedic  No limitations of motion  feet .  No crepitus or effusions noted.  No bony pathology or digital deformities noted.  Skin  normotropic skin with no porokeratosis noted bilaterally.  No signs of infections or ulcers noted.   Red swollen painful localized area in the center of his right arch under 1st MCJ.  No fluctuance or pus noted at site of foreign body penetration.  No streaking noted.  Onychomycosis  Pain in right toes  Pain in left toes  Foreign body right foot. Cellulitis right foot.  Consent was obtained for treatment procedures.   Mechanical debridement of nails 1-5  bilaterally performed with a nail nipper.  Filed with dremel without incident. Debridement of necrotic tissue right arch .  No pus or drainage noted upon debridement of necrotic skin.  Neosporin/DSD.  Xray reveals no evidence of glass but significant changes !st  MPJ  right foot.   Return office visit   7-10 days for Dr.  Allena Katz.                 Told patient to return for periodic foot care and evaluation due to potential at risk complications.   Helane Gunther DPM

## 2021-01-30 DIAGNOSIS — Z419 Encounter for procedure for purposes other than remedying health state, unspecified: Secondary | ICD-10-CM | POA: Diagnosis not present

## 2021-02-04 ENCOUNTER — Ambulatory Visit: Payer: Medicaid Other | Admitting: Podiatry

## 2021-02-09 ENCOUNTER — Other Ambulatory Visit: Payer: Self-pay | Admitting: Internal Medicine

## 2021-02-09 ENCOUNTER — Other Ambulatory Visit: Payer: Self-pay | Admitting: Student

## 2021-02-09 DIAGNOSIS — K219 Gastro-esophageal reflux disease without esophagitis: Secondary | ICD-10-CM

## 2021-02-25 ENCOUNTER — Other Ambulatory Visit: Payer: Self-pay | Admitting: Student

## 2021-02-25 DIAGNOSIS — F17209 Nicotine dependence, unspecified, with unspecified nicotine-induced disorders: Secondary | ICD-10-CM

## 2021-02-26 ENCOUNTER — Other Ambulatory Visit: Payer: Self-pay | Admitting: Internal Medicine

## 2021-02-26 DIAGNOSIS — Z789 Other specified health status: Secondary | ICD-10-CM

## 2021-02-26 DIAGNOSIS — Z7289 Other problems related to lifestyle: Secondary | ICD-10-CM

## 2021-03-02 ENCOUNTER — Telehealth: Payer: Self-pay

## 2021-03-02 DIAGNOSIS — Z419 Encounter for procedure for purposes other than remedying health state, unspecified: Secondary | ICD-10-CM | POA: Diagnosis not present

## 2021-03-02 NOTE — Telephone Encounter (Signed)
Melissa from [facility] is requesting a call back she is trying to get the correct contact info for PT [phone]

## 2021-03-02 NOTE — Telephone Encounter (Signed)
Forwarding to [employer].   I can't find where we ordered PT, recerts, etc. Will you please check into this? Thanks, [employer]

## 2021-03-03 ENCOUNTER — Other Ambulatory Visit: Payer: Self-pay | Admitting: Internal Medicine

## 2021-03-03 DIAGNOSIS — Z7289 Other problems related to lifestyle: Secondary | ICD-10-CM

## 2021-03-03 DIAGNOSIS — Z789 Other specified health status: Secondary | ICD-10-CM

## 2021-03-03 NOTE — Telephone Encounter (Signed)
Returned call to Micheal Likens, CCM with [facility]. No answer. Left message on VM requesting return call.

## 2021-03-09 ENCOUNTER — Other Ambulatory Visit: Payer: Self-pay | Admitting: Internal Medicine

## 2021-03-09 DIAGNOSIS — E118 Type 2 diabetes mellitus with unspecified complications: Secondary | ICD-10-CM

## 2021-03-09 NOTE — Telephone Encounter (Signed)
Next appt scheduled 6/28 with PCP. 

## 2021-03-29 ENCOUNTER — Encounter: Payer: Medicaid Other | Admitting: Internal Medicine

## 2021-03-31 ENCOUNTER — Telehealth: Payer: Self-pay

## 2021-03-31 ENCOUNTER — Ambulatory Visit (INDEPENDENT_AMBULATORY_CARE_PROVIDER_SITE_OTHER): Payer: Medicaid Other | Admitting: Internal Medicine

## 2021-03-31 ENCOUNTER — Encounter: Payer: Self-pay | Admitting: Internal Medicine

## 2021-03-31 VITALS — BP 128/81 | HR 98 | Temp 98.6°F | Ht 72.0 in | Wt 184.7 lb

## 2021-03-31 DIAGNOSIS — E1159 Type 2 diabetes mellitus with other circulatory complications: Secondary | ICD-10-CM

## 2021-03-31 DIAGNOSIS — F1011 Alcohol abuse, in remission: Secondary | ICD-10-CM | POA: Diagnosis not present

## 2021-03-31 DIAGNOSIS — E1169 Type 2 diabetes mellitus with other specified complication: Secondary | ICD-10-CM | POA: Diagnosis not present

## 2021-03-31 DIAGNOSIS — E119 Type 2 diabetes mellitus without complications: Secondary | ICD-10-CM | POA: Diagnosis not present

## 2021-03-31 DIAGNOSIS — I152 Hypertension secondary to endocrine disorders: Secondary | ICD-10-CM | POA: Diagnosis not present

## 2021-03-31 DIAGNOSIS — Z720 Tobacco use: Secondary | ICD-10-CM | POA: Diagnosis not present

## 2021-03-31 DIAGNOSIS — J449 Chronic obstructive pulmonary disease, unspecified: Secondary | ICD-10-CM

## 2021-03-31 DIAGNOSIS — K219 Gastro-esophageal reflux disease without esophagitis: Secondary | ICD-10-CM

## 2021-03-31 DIAGNOSIS — L97518 Non-pressure chronic ulcer of other part of right foot with other specified severity: Secondary | ICD-10-CM | POA: Diagnosis not present

## 2021-03-31 DIAGNOSIS — E781 Pure hyperglyceridemia: Secondary | ICD-10-CM

## 2021-03-31 DIAGNOSIS — E785 Hyperlipidemia, unspecified: Secondary | ICD-10-CM | POA: Diagnosis not present

## 2021-03-31 LAB — POCT GLYCOSYLATED HEMOGLOBIN (HGB A1C): Hemoglobin A1C: 6.6 % — AB (ref 4.0–5.6)

## 2021-03-31 LAB — GLUCOSE, CAPILLARY: Glucose-Capillary: 152 mg/dL — ABNORMAL HIGH (ref 70–99)

## 2021-03-31 MED ORDER — PANTOPRAZOLE SODIUM 40 MG PO TBEC: 40.0000 mg | DELAYED_RELEASE_TABLET | Freq: Every day | ORAL | 1 refills | Status: DC

## 2021-03-31 MED ORDER — STIOLTO RESPIMAT 2.5-2.5 MCG/ACT IN AERS: 2.0000 | INHALATION_SPRAY | Freq: Every day | RESPIRATORY_TRACT | 1 refills | Status: DC

## 2021-03-31 MED ORDER — DOXYCYCLINE HYCLATE 100 MG PO TABS: 100.0000 mg | ORAL_TABLET | Freq: Two times a day (BID) | ORAL | 0 refills | Status: DC

## 2021-03-31 MED ORDER — VARENICLINE TARTRATE 0.5 MG X 11 & 1 MG X 42 PO MISC: ORAL | 0 refills | Status: DC

## 2021-03-31 NOTE — Patient Instructions (Addendum)
Foot wound -start taking doxycycline  -follow up in the podiatry clinic  Diabetes -GREAT JOB! Your A1C is 6.6! Continue doing what you are doing  COPD, smoking -STOP SMOKING! I have sent chantix to your pharmacy however I am not sure if it will be covered by your insurance. If it is too expensive, do not pick it up, and let our pharmacist, Boykin Reaper, know when she calls you. -Call to get the lung cancer screening CT rescheduled -Call to get the PFTs rescheduled   Heartburn -start protonix -if it is not better in 3 months, we will consider sending you to the stomach doctor

## 2021-03-31 NOTE — Telephone Encounter (Signed)
Hi Dr. Ephriam Knuckles  Refill request received from pharmacy for [facility], this has fallen off the patient's med list.  He has an appt with you this afternoon, if you could please check into this. Thanks, [employer]

## 2021-03-31 NOTE — Assessment & Plan Note (Addendum)
LDCT and PFT were ordered last visit however he no-showed but of these.  Will continue to encourage him to get these completed however, at this point, will get him started on maintenance therapy with stiolto. He is encouraged to reschedule the LDCT and PFTs.

## 2021-04-01 DIAGNOSIS — Z419 Encounter for procedure for purposes other than remedying health state, unspecified: Secondary | ICD-10-CM | POA: Diagnosis not present

## 2021-04-01 LAB — LIPID PANEL
Chol/HDL Ratio: 2.4 ratio (ref 0.0–5.0)
Cholesterol, Total: 142 mg/dL (ref 100–199)
HDL: 59 mg/dL (ref >39)
LDL Chol Calc (NIH): 20 mg/dL (ref 0–99)
Triglycerides: 471 mg/dL — ABNORMAL HIGH (ref 0–149)
VLDL Cholesterol Cal: 63 mg/dL — ABNORMAL HIGH (ref 5–40)

## 2021-04-03 ENCOUNTER — Encounter: Payer: Self-pay | Admitting: Internal Medicine

## 2021-04-03 DIAGNOSIS — L97509 Non-pressure chronic ulcer of other part of unspecified foot with unspecified severity: Secondary | ICD-10-CM | POA: Insufficient documentation

## 2021-04-03 NOTE — Assessment & Plan Note (Signed)
Current medications: enalapril 10mg  daily Endorses medication compliance. Denies adverse medication reactions. Blood pressure is at goal in the office today. BP Readings from Last 3 Encounters:  03/31/21 128/81  12/28/20 137/76  12/01/20 (!) 141/78   Plan -continue current management -f/u 3 months

## 2021-04-03 NOTE — Assessment & Plan Note (Signed)
He continues to smoke but would like to stop. He understands the importance but this is still an ongoing issue for him. He is planning to start using nicotine patches and lozenges which he has at home.  -start chantix -referred to [facility] for ongoing assistance with smoking cessation

## 2021-04-03 NOTE — Progress Notes (Signed)
Office Visit   Patient ID: Ruben Pierce, male    DOB: 07/15/1966, 55 y.o.   MRN: [id number]   PCP: Elige Radon, MD   Subjective:  Ruben Pierce is a 55 y.o. year old male who presents for diabetes, hypertension, hyperlipidemia, tobacco use disorder, alcohol use disorder follow up. Please refer to problem based charting for assessment and plan.   ACTIVE MEDICATIONS   Outpatient Medications Prior to Visit  Medication Sig   aspirin EC 325 MG tablet Take 1 tablet (325 mg total) by mouth daily.   atorvastatin (LIPITOR) 80 MG tablet Take 1 tablet (80 mg total) by mouth daily.   enalapril (VASOTEC) 10 MG tablet Take 1 tablet (10 mg total) by mouth daily.   glucose blood (ACCU-CHEK AVIVA PLUS) test strip Use as instructed   ipratropium (ATROVENT HFA) 17 MCG/ACT inhaler Inhale 2 puffs into the lungs 4 (four) times daily.   Lancets (FREESTYLE) lancets Use as instructed   metFORMIN (GLUCOPHAGE) 1000 MG tablet Take 1 tablet (1,000 mg total) by mouth 2 (two) times daily with a meal.   nicotine (NICODERM CQ - DOSED IN MG/24 HOURS) 21 mg/24hr patch Place 1 patch (21 mg total) onto the skin daily.   nitroGLYCERIN (NITROSTAT) 0.4 MG SL tablet Place 1 tablet (0.4 mg total) under the tongue every 5 (five) minutes as needed for chest pain. If chest pain does not resolve after 2 tablets, please obtain evaluation in the ER.   sildenafil (VIAGRA) 50 MG tablet Take 1 tablet (50 mg total) by mouth as needed for erectile dysfunction.   [DISCONTINUED] doxycycline (VIBRA-TABS) 100 MG tablet Take 1 tablet (100 mg total) by mouth 2 (two) times daily.   [DISCONTINUED] naltrexone (DEPADE) 50 MG tablet Take 1 tablet (50 mg total) by mouth daily.   No facility-administered medications prior to visit.     Objective:   BP 128/81 (BP Location: Right Arm, Patient Position: Sitting)   Pulse 98   Temp 98.6 F (37 C)   Ht 6' (1.829 m)   Wt 184 lb 11.2 oz (83.8 kg)   SpO2 97%   BMI 25.05 kg/m  Wt Readings from  Last 3 Encounters:  03/31/21 184 lb 11.2 oz (83.8 kg)  01/05/21 203 lb 4.8 oz (92.2 kg)  12/28/20 206 lb 11.2 oz (93.8 kg)    BP Readings from Last 3 Encounters:  03/31/21 128/81  12/28/20 137/76  12/01/20 (!) 141/78   Cardiac: RRR, no LE edema Pulm: diminished lung sounds throughout, no wheezing Right foot: superficial abrasion on the 2nd toe. Fluid collection on the arch of the foot. Painful to touch. No erythema, increased warmth, or drainage.   Health Maintenance:   Health Maintenance  Topic Date Due   Pneumococcal Vaccine 85-31 Years old (1 - PCV) Never done   Zoster Vaccines- Shingrix (1 of 2) Never done   OPHTHALMOLOGY EXAM  03/29/2021   INFLUENZA VACCINE  05/02/2021   COVID-19 Vaccine (4 - Booster for Moderna series) 05/23/2021   HEMOGLOBIN A1C  07/01/2021   FOOT EXAM  03/31/2022   TETANUS/TDAP  03/29/2029   COLONOSCOPY (Pts 45-86yrs Insurance coverage will need to be confirmed)  11/26/2029   PNEUMOCOCCAL POLYSACCHARIDE VACCINE AGE 54-64 HIGH RISK  Completed   Hepatitis C Screening  Completed   HIV Screening  Completed   HPV VACCINES  Aged Out    Assessment & Plan:   Problem List Items Addressed This Visit       Cardiovascular and Mediastinum  Hypertension associated with diabetes (HCC) (Chronic)    Current medications: enalapril 10mg  daily Endorses medication compliance. Denies adverse medication reactions. Blood pressure is at goal in the office today. BP Readings from Last 3 Encounters:  03/31/21 128/81  12/28/20 137/76  12/01/20 (!) 141/78   Plan -continue current management -f/u 3 months          Respiratory   Obstructive lung disease (HCC) (Chronic)    LDCT and PFT were ordered last visit however he no-showed but of these.  Will continue to encourage him to get these completed however, at this point, will get him started on maintenance therapy with stiolto. He is encouraged to reschedule the LDCT and PFTs.       Relevant Orders   Amb  Referral to Clinical Pharmacist     Endocrine   Type 2 diabetes mellitus (HCC) - Primary (Chronic)    Current medications: metformin 1000mg  BID A1C at goal 6.6.  Foot exam performed at today's visit.  Continue current management. Encouraged retinopathy screening.        Relevant Orders   POC Hbg A1C (Completed)   Hyperlipidemia associated with type 2 diabetes mellitus (HCC) (Chronic)    Total cholesterol 142; LDL 20; HDL 59. Continue lipitor 80mg  daily.         Other   Tobacco abuse (Chronic)    He continues to smoke but would like to stop. He understands the importance but this is still an ongoing issue for him. He is planning to start using nicotine patches and lozenges which he has at home.  -start chantix -referred to [facility] for ongoing assistance with smoking cessation       Alcohol use disorder, mild, in early remission (Chronic)    Reports drinking 2-3 beers 3-4 days per week. Given his drinking history, I encouraged him to decrease this. He was previously on naltrexone but is no longer taking it. He does not feel himself having cravings. Will continue to watch alcohol use closely.       Severe Hypertriglyceridemia    Triglycerides are still elevated but improved to 471 today, not fasting. Continue high intensity statin use.        Relevant Orders   Lipid panel (Completed)   Foot ulcer (HCC)    He had been following with podiatry for this however has not seen them for a while. They did not think there was a foreign body present. It is painful for him. It is located on the right arch and is a small fluid collection. He reports some pus drainage in the office today however I was unable to express any at today's visit. It does not appear infected--no increased warmth, erythema. Would consider it high risk given his diabetes, despite being fairly well controlled. Plan -will give a course of doxycycline -encouraged him to arrange a follow up visit with podiatry          Return in about 3 months (around 07/01/2021).   Pt discussed with Dr. , MD Internal Medicine Resident PGY-2 [facility] Internal Medicine Residency Pager: [phone] 04/03/2021 5:22 PM

## 2021-04-03 NOTE — Assessment & Plan Note (Signed)
Reports drinking 2-3 beers 3-4 days per week. Given his drinking history, I encouraged him to decrease this. He was previously on naltrexone but is no longer taking it. He does not feel himself having cravings. Will continue to watch alcohol use closely.

## 2021-04-03 NOTE — Assessment & Plan Note (Signed)
Total cholesterol 142; LDL 20; HDL 59. Continue lipitor 80mg  daily.

## 2021-04-03 NOTE — Assessment & Plan Note (Signed)
He had been following with podiatry for this however has not seen them for a while. They did not think there was a foreign body present. It is painful for him. It is located on the right arch and is a small fluid collection. He reports some pus drainage in the office today however I was unable to express any at today's visit. It does not appear infected--no increased warmth, erythema. Would consider it high risk given his diabetes, despite being fairly well controlled. Plan -will give a course of doxycycline -encouraged him to arrange a follow up visit with podiatry

## 2021-04-03 NOTE — Assessment & Plan Note (Addendum)
Triglycerides are still elevated but improved to 471 today, not fasting. Continue high intensity statin use.

## 2021-04-03 NOTE — Assessment & Plan Note (Signed)
Current medications: metformin 1000mg  BID A1C at goal 6.6.  Foot exam performed at today's visit.  Continue current management. Encouraged retinopathy screening.

## 2021-04-06 NOTE — Progress Notes (Signed)
Internal Medicine Clinic Attending  Case discussed with Dr. Christian  At the time of the visit.  We reviewed the resident's history and exam and pertinent patient test results.  I agree with the assessment, diagnosis, and plan of care documented in the resident's note.  

## 2021-04-07 ENCOUNTER — Ambulatory Visit (INDEPENDENT_AMBULATORY_CARE_PROVIDER_SITE_OTHER): Payer: Medicaid Other | Admitting: Podiatry

## 2021-04-07 ENCOUNTER — Encounter: Payer: Self-pay | Admitting: Dietician

## 2021-04-07 ENCOUNTER — Encounter: Payer: Self-pay | Admitting: Podiatry

## 2021-04-07 ENCOUNTER — Other Ambulatory Visit: Payer: Self-pay

## 2021-04-07 DIAGNOSIS — S90851A Superficial foreign body, right foot, initial encounter: Secondary | ICD-10-CM | POA: Diagnosis not present

## 2021-04-08 ENCOUNTER — Encounter: Payer: Self-pay | Admitting: Internal Medicine

## 2021-04-08 ENCOUNTER — Telehealth: Payer: Self-pay | Admitting: *Deleted

## 2021-04-08 NOTE — Telephone Encounter (Signed)
Information was sent through CoverMyMeds for [place] for Varenicline 0.5 mg tablets.  Awaitng determination from [facility]  Angelina Ok, RN 04/07/2021 12:17 PM

## 2021-04-10 NOTE — Progress Notes (Signed)
Subjective:   Patient ID: Ruben Pierce, male   DOB: 55 y.o.   MRN: [id number]   HPI Patient presents with a pain on the inside of the right ankle and is concerned that it might be something lodged in there.  States its been sore and at times draining but he has had no systemic redness or indications of infection   ROS      Objective:  Physical Exam  Neurovascular status intact with patient found to have a irritant irritation on the right inside arch localized with no proximal edema erythema or drainage noted     Assessment:  Possibility for foreign body versus infection or other pathology right     Plan:  H&P condition reviewed at great length x-ray reviewed today I did a proximal nerve block of the area and using sterile instrumentation I open the area up and was able to excise a sharp protruding piece of glass that was in the subcutaneous tissue.  Flushed the tissue found to be clean did not note any further pathology applied sterile dressing instructed on soaks bandage usage and reappoint if any issues were to occur  X-rays were negative for signs of foreign body or other pathology

## 2021-04-18 ENCOUNTER — Encounter: Payer: Medicaid Other | Admitting: Pharmacist

## 2021-04-26 NOTE — Addendum Note (Signed)
Addended by: Neomia Dear on: 04/26/2021 05:50 PM   Modules accepted: Orders

## 2021-04-27 ENCOUNTER — Other Ambulatory Visit: Payer: Self-pay | Admitting: Internal Medicine

## 2021-04-27 DIAGNOSIS — K219 Gastro-esophageal reflux disease without esophagitis: Secondary | ICD-10-CM

## 2021-04-29 ENCOUNTER — Ambulatory Visit: Payer: Medicaid Other | Admitting: Podiatry

## 2021-05-02 DIAGNOSIS — Z419 Encounter for procedure for purposes other than remedying health state, unspecified: Secondary | ICD-10-CM | POA: Diagnosis not present

## 2021-05-05 ENCOUNTER — Other Ambulatory Visit: Payer: Self-pay | Admitting: Internal Medicine

## 2021-05-05 DIAGNOSIS — J449 Chronic obstructive pulmonary disease, unspecified: Secondary | ICD-10-CM

## 2021-05-17 ENCOUNTER — Other Ambulatory Visit: Payer: Self-pay | Admitting: Podiatry

## 2021-05-28 ENCOUNTER — Other Ambulatory Visit: Payer: Self-pay | Admitting: Internal Medicine

## 2021-05-28 DIAGNOSIS — K219 Gastro-esophageal reflux disease without esophagitis: Secondary | ICD-10-CM

## 2021-06-02 DIAGNOSIS — Z419 Encounter for procedure for purposes other than remedying health state, unspecified: Secondary | ICD-10-CM | POA: Diagnosis not present

## 2021-06-07 ENCOUNTER — Other Ambulatory Visit: Payer: Self-pay | Admitting: Student

## 2021-06-07 ENCOUNTER — Other Ambulatory Visit: Payer: Self-pay | Admitting: Internal Medicine

## 2021-06-07 ENCOUNTER — Other Ambulatory Visit: Payer: Self-pay | Admitting: Podiatry

## 2021-06-07 DIAGNOSIS — E1159 Type 2 diabetes mellitus with other circulatory complications: Secondary | ICD-10-CM

## 2021-06-08 NOTE — Telephone Encounter (Signed)
Can we check into what dose he needs? Looks like he also has the 21mg  patch on the med list.

## 2021-06-08 NOTE — Telephone Encounter (Signed)
Called pt -no answer; left message to clarify nicotine patches. I also called [facility] - stated pt should had finished 21 mg patches and now he's on 14 mg.

## 2021-06-08 NOTE — Telephone Encounter (Signed)
Ok, thank you

## 2021-06-08 NOTE — Telephone Encounter (Signed)
Please have him arrange an office visit for 52month COPD follow up. Thanks!

## 2021-06-25 ENCOUNTER — Encounter: Payer: Self-pay | Admitting: Internal Medicine

## 2021-06-25 NOTE — Progress Notes (Signed)
Office Visit   Patient ID: Ruben Pierce, male    DOB: 1965/12/29, 55 y.o.   MRN: [id number]   PCP: Elige Radon, MD   Subjective:  Ruben Pierce is a 55 y.o. year old male who presents for diabetes, hypertension, hyperlipidemia, tobacco use disorder, alcohol use disorder follow up. Please refer to problem based charting for assessment and plan.   ACTIVE MEDICATIONS   Outpatient Medications Prior to Visit  Medication Sig   aspirin EC 325 MG tablet Take 1 tablet (325 mg total) by mouth daily.   ATROVENT HFA 17 MCG/ACT inhaler Inhale 2 puffs into the lungs 4 (four) times daily.   enalapril (VASOTEC) 10 MG tablet Take 1 tablet (10 mg total) by mouth daily.   famotidine (PEPCID) 20 MG tablet Take 1 tablet (20 mg total) by mouth 2 (two) times daily.   glucose blood (ACCU-CHEK AVIVA PLUS) test strip Use as instructed   Lancets (FREESTYLE) lancets Use as instructed   NICOTINE STEP 2 14 MG/24HR patch Place 1 patch (14 mg total) onto the skin daily.   nitroGLYCERIN (NITROSTAT) 0.4 MG SL tablet Place 1 tablet (0.4 mg total) under the tongue every 5 (five) minutes as needed for chest pain. If chest pain does not resolve after 2 tablets, please obtain evaluation in the ER.   sildenafil (VIAGRA) 50 MG tablet Take 1 tablet (50 mg total) by mouth as needed for erectile dysfunction.   [DISCONTINUED] atorvastatin (LIPITOR) 80 MG tablet Take 1 tablet (80 mg total) by mouth daily.   [DISCONTINUED] diclofenac Sodium (VOLTAREN) 1 % GEL APPLY TWO GRAMS TOPICALLY FOUR TIMES DAILY. RUB INTO THE AFFECTED AREA OF FOOT TWO - FOUR TIMES DAILY   [DISCONTINUED] doxycycline (VIBRA-TABS) 100 MG tablet Take 1 tablet (100 mg total) by mouth 2 (two) times daily.   [DISCONTINUED] metFORMIN (GLUCOPHAGE) 1000 MG tablet Take 1 tablet (1,000 mg total) by mouth 2 (two) times daily with a meal.   [DISCONTINUED] pantoprazole (PROTONIX) 40 MG tablet Take 1 tablet (40 mg total) by mouth daily.   [DISCONTINUED] STIOLTO RESPIMAT  2.5-2.5 MCG/ACT AERS Inhale 2 puffs into the lungs daily.   [DISCONTINUED] varenicline (CHANTIX STARTING MONTH PAK) 0.5 MG X 11 & 1 MG X 42 tablet Take one 0.5 mg tablet by mouth once daily for 3 days, then increase to one 0.5 mg tablet twice daily for 4 days, then increase to one 1 mg tablet twice daily.   No facility-administered medications prior to visit.     Objective:   BP 133/84 (BP Location: Right Arm, Patient Position: Sitting, Cuff Size: Normal)   Pulse (!) 104   Temp 98 F (36.7 C) (Oral)   Ht 6\' 1"  (1.854 m)   Wt 192 lb 9.6 oz (87.4 kg)   SpO2 100%   BMI 25.41 kg/m  Wt Readings from Last 3 Encounters:  06/27/21 192 lb 9.6 oz (87.4 kg)  03/31/21 184 lb 11.2 oz (83.8 kg)  01/05/21 203 lb 4.8 oz (92.2 kg)    BP Readings from Last 3 Encounters:  06/27/21 133/84  03/31/21 128/81  12/28/20 137/76   Cardiac: RRR, no LE edema Pulm: breathing comfortably on room, lung sounds diminished throughout Right foot: toenails are thick and yellow colored. Diminished sensation in the toes. Well healing surgical incision on the medial instep. Small ulceration on dorsal aspect of the 2nd digit, no surrounding erythema or edema. No drainage. Hammer toe deformity.  Health Maintenance:   Health Maintenance  Topic Date Due  Zoster Vaccines- Shingrix (1 of 2) Never done   OPHTHALMOLOGY EXAM  03/29/2021   INFLUENZA VACCINE  05/02/2021   COVID-19 Vaccine (4 - Booster for Moderna series) 05/23/2021   HEMOGLOBIN A1C  09/26/2021   FOOT EXAM  03/31/2022   TETANUS/TDAP  03/29/2029   COLONOSCOPY (Pts 45-62yrs Insurance coverage will need to be confirmed)  11/26/2029   Hepatitis C Screening  Completed   HIV Screening  Completed   HPV VACCINES  Aged Out    Assessment & Plan:   Problem List Items Addressed This Visit       Cardiovascular and Mediastinum   Hypertension associated with diabetes (HCC) (Chronic)    Current medications: enalapril 10mg  daily Endorses medication compliance.  Denies adverse medication reactions. Blood pressure is at goal in the office today. BP Readings from Last 3 Encounters:  06/27/21 133/84  03/31/21 128/81  12/28/20 137/76   Plan continue current management labs today f/u 3 months       Relevant Medications   metFORMIN (GLUCOPHAGE) 1000 MG tablet   atorvastatin (LIPITOR) 80 MG tablet   Other Relevant Orders   Basic metabolic panel   12/30/20 + Anion Gap     Respiratory   COPD (chronic obstructive pulmonary disease) (HCC) - Primary (Chronic)    COPD follow up: Current medications: stiolto, atrovent Med Adherence:  [x]  Yes    []  No Symptom control: []  Yes    [x]  No  CAT score 20  # of exacerbations in past 7mo: 0 Smoking history: [x]  Yes    []  No  Currently smoking: [x]  Yes    []  No  0.5pack per day Lung cancer screening in the past 12 months:  []  Yes    [x]  No  -ordered but not performed Plan:  We will work on escalating his therapy to trelegy as symptoms are uncontrolled on stiolto. I explained to him that it will need a prior auth which will take a while and to continue to use stiolto in the mean time. Encouraged smoking cessation We have discussed LDCT and PFTs numerous times however he has continued to not follow up on these. He understands the reasoning of these. I will defer re-ordering these today. Chantix refilled, continue nicotine patches Follow up in January with me       Relevant Medications   Fluticasone-Umeclidin-Vilant (TRELEGY ELLIPTA) 100-62.5-25 MCG/INH AEPB   Tiotropium Bromide-Olodaterol (STIOLTO RESPIMAT) 2.5-2.5 MCG/ACT AERS   varenicline (CHANTIX STARTING MONTH PAK) 0.5 MG X 11 & 1 MG X 42 tablet     Endocrine   Type 2 diabetes mellitus (HCC) (Chronic)    Current medications: metformin 1000mg  BID A1C at goal 6.5 Last foot exam 04/2021 Retinopathy screening due.  Continue current management.  Encouraged retinopathy screening. Ophthalmology referral placed Follow up January 2023      Relevant  Medications   metFORMIN (GLUCOPHAGE) 1000 MG tablet   atorvastatin (LIPITOR) 80 MG tablet   Other Relevant Orders   POC Hbg A1C (Completed)   Ambulatory referral to Ophthalmology   Hyperlipidemia associated with type 2 diabetes mellitus (HCC) (Chronic)   Relevant Medications   metFORMIN (GLUCOPHAGE) 1000 MG tablet   atorvastatin (LIPITOR) 80 MG tablet     Musculoskeletal and Integument   Onychomycosis    Check LFTs today Start terbinafine x12w Repeat LFTs in 6w      Relevant Medications   terbinafine (LAMISIL) 250 MG tablet   Other Relevant Orders   Liver Profile   CMP14 + Anion Gap  Other   Tobacco abuse (Chronic)   Relevant Medications   varenicline (CHANTIX STARTING MONTH PAK) 0.5 MG X 11 & 1 MG X 42 tablet   Alcohol use disorder, mild, in early remission (Chronic)    No increase in alcohol use since LOV. He is drinking (2) 40oz beers roughly 2-3 days per week. I cautioned him on alcohol use again today but so far, he seems to be doing ok with this.      Iron deficiency    Check cbc, repeat iron studies today.       Relevant Orders   CBC no Diff   Iron, TIBC and Ferritin Panel   Foot ulcer (HCC)    Interval history: followed up with podiatry since our last office visit. Area was incised and he was found to have a small shard of of glass which was removed.   Surgical incision area appears to be healing well. He also has an ulceration on the 2nd digit of his right foot located on the dorsal aspect over the joint. It was present and unchanged from his LOV. 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 We will have him follow up with podiatry again for evaluation      Other Visit Diagnoses     Need for immunization against influenza       Relevant Orders   Flu Vaccine QUAD 6+ mos IM (Fluarix)       Return in about 3 months (around 10/04/2021) for Follow up of chronic medical conditions with Dr. Ephriam Knuckles; return  in 6 weeks for labs only.   Pt discussed with Dr. Fredrich Romans, MD Internal Medicine Resident PGY-2 [facility] 06/27/2021 1:24 PM

## 2021-06-25 NOTE — Assessment & Plan Note (Addendum)
Current medications: metformin 1000mg  BID A1C at goal 6.5 Last foot exam 04/2021 Retinopathy screening due.   Continue current management.   Encouraged retinopathy screening. Ophthalmology referral placed  Follow up January 2023

## 2021-06-25 NOTE — Assessment & Plan Note (Signed)
Check cbc, repeat iron studies today.

## 2021-06-25 NOTE — Assessment & Plan Note (Addendum)
Current medications: enalapril 10mg  daily Endorses medication compliance. Denies adverse medication reactions. Blood pressure is at goal in the office today. BP Readings from Last 3 Encounters:  06/27/21 133/84  03/31/21 128/81  12/28/20 137/76   Plan  continue current management  labs today  f/u 3 months

## 2021-06-25 NOTE — Assessment & Plan Note (Addendum)
Interval history: followed up with podiatry since our last office visit. Area was incised and he was found to have a small shard of of glass which was removed.   Surgical incision area appears to be healing well.  He also has an ulceration on the 2nd digit of his right foot located on the dorsal aspect over the joint. It was present and unchanged from his LOV. 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 No findings on exam to raise suspicion for infection  We will have him follow up with podiatry again for evaluation

## 2021-06-27 ENCOUNTER — Telehealth: Payer: Self-pay

## 2021-06-27 ENCOUNTER — Ambulatory Visit (INDEPENDENT_AMBULATORY_CARE_PROVIDER_SITE_OTHER): Payer: Medicaid Other | Admitting: Internal Medicine

## 2021-06-27 ENCOUNTER — Other Ambulatory Visit: Payer: Self-pay

## 2021-06-27 ENCOUNTER — Encounter: Payer: Self-pay | Admitting: Internal Medicine

## 2021-06-27 VITALS — BP 133/84 | HR 104 | Temp 98.0°F | Ht 73.0 in | Wt 192.6 lb

## 2021-06-27 DIAGNOSIS — E611 Iron deficiency: Secondary | ICD-10-CM

## 2021-06-27 DIAGNOSIS — L97518 Non-pressure chronic ulcer of other part of right foot with other specified severity: Secondary | ICD-10-CM | POA: Diagnosis not present

## 2021-06-27 DIAGNOSIS — E785 Hyperlipidemia, unspecified: Secondary | ICD-10-CM

## 2021-06-27 DIAGNOSIS — I152 Hypertension secondary to endocrine disorders: Secondary | ICD-10-CM

## 2021-06-27 DIAGNOSIS — B351 Tinea unguium: Secondary | ICD-10-CM | POA: Insufficient documentation

## 2021-06-27 DIAGNOSIS — Z720 Tobacco use: Secondary | ICD-10-CM

## 2021-06-27 DIAGNOSIS — Z23 Encounter for immunization: Secondary | ICD-10-CM

## 2021-06-27 DIAGNOSIS — E119 Type 2 diabetes mellitus without complications: Secondary | ICD-10-CM | POA: Diagnosis not present

## 2021-06-27 DIAGNOSIS — F1011 Alcohol abuse, in remission: Secondary | ICD-10-CM | POA: Diagnosis not present

## 2021-06-27 DIAGNOSIS — E1159 Type 2 diabetes mellitus with other circulatory complications: Secondary | ICD-10-CM | POA: Diagnosis not present

## 2021-06-27 DIAGNOSIS — E1169 Type 2 diabetes mellitus with other specified complication: Secondary | ICD-10-CM

## 2021-06-27 DIAGNOSIS — J449 Chronic obstructive pulmonary disease, unspecified: Secondary | ICD-10-CM

## 2021-06-27 LAB — POCT GLYCOSYLATED HEMOGLOBIN (HGB A1C): Hemoglobin A1C: 6.5 % — AB (ref 4.0–5.6)

## 2021-06-27 MED ORDER — STIOLTO RESPIMAT 2.5-2.5 MCG/ACT IN AERS: 2.0000 | INHALATION_SPRAY | Freq: Every day | RESPIRATORY_TRACT | 1 refills | Status: DC

## 2021-06-27 MED ORDER — ATORVASTATIN CALCIUM 80 MG PO TABS: 80.0000 mg | ORAL_TABLET | Freq: Every day | ORAL | 2 refills | Status: DC

## 2021-06-27 MED ORDER — VARENICLINE TARTRATE 0.5 MG X 11 & 1 MG X 42 PO MISC: ORAL | 0 refills | Status: DC

## 2021-06-27 MED ORDER — TERBINAFINE HCL 250 MG PO TABS: 250.0000 mg | ORAL_TABLET | Freq: Every day | ORAL | 0 refills | Status: AC

## 2021-06-27 MED ORDER — METFORMIN HCL 1000 MG PO TABS: 1000.0000 mg | ORAL_TABLET | Freq: Two times a day (BID) | ORAL | 2 refills | Status: DC

## 2021-06-27 MED ORDER — TRELEGY ELLIPTA 100-62.5-25 MCG/INH IN AEPB: INHALATION_SPRAY | RESPIRATORY_TRACT | 3 refills | Status: DC

## 2021-06-27 NOTE — Assessment & Plan Note (Addendum)
COPD follow up: Current medications: stiolto, atrovent Med Adherence:  [x]  Yes    []  No Symptom control: []  Yes    [x]  No  CAT score 20  # of exacerbations in past 48mo: 0 Smoking history: [x]  Yes    []  No  Currently smoking: [x]  Yes    []  No  0.5pack per day Lung cancer screening in the past 12 months:  []  Yes    [x]  No  -ordered but not performed Plan:   We will work on escalating his therapy to trelegy as symptoms are uncontrolled on stiolto. I explained to him that it will need a prior auth which will take a while and to continue to use stiolto in the mean time.  Encouraged smoking cessation  We have discussed LDCT and PFTs numerous times however he has continued to not follow up on these. He understands the reasoning of these. I will defer re-ordering these today.  Chantix refilled, continue nicotine patches  Follow up in January with me

## 2021-06-27 NOTE — Assessment & Plan Note (Signed)
Check LFTs today  Start terbinafine x12w  Repeat LFTs in 6w

## 2021-06-27 NOTE — Assessment & Plan Note (Signed)
No increase in alcohol use since LOV. He is drinking (2) 40oz beers roughly 2-3 days per week. I cautioned him on alcohol use again today but so far, he seems to be doing ok with this.

## 2021-06-27 NOTE — Patient Instructions (Addendum)
Mr.Edvardo Boutwell, it was a pleasure seeing you today!  Today we discussed:  Diabetes: your A1C is 6.5--very good!  -continue metformin  COPD -We will try to get you approved for Trelegy. In the meantime, continue to use Stioloto. Once you receive the Trelegy, stop Stioloto.  -Continue to work on stopping smoking.  Toe nail fungus -Start taking terbinafine. You will take this for 12 weeks. -Please return the beginning of November to have your liver function rechecked. You do not need to see a doctor for this, just schedule a lab only visit. -Follow up with podiatry  Follow-up: January 2023  Please make sure to arrive 15 minutes prior to your next appointment. If you arrive late, you may be asked to reschedule.   We look forward to seeing you next time. Please call our clinic at [phone] if you have any questions or concerns. The best time to call is Monday-Friday from 9am-4pm, but there is someone available 24/7. If after hours or the weekend, call the main hospital number and ask for the Internal Medicine Resident On-Call. If you need medication refills, please notify your pharmacy one week in advance and they will send [place] a request.  Thank you for letting us take part in your care. Wishing you the best!

## 2021-06-27 NOTE — Telephone Encounter (Signed)
PA  came through for pt  Trelegy Ellipta  was done and sent back through cover my meds awaiting approval  or denial

## 2021-06-28 NOTE — Telephone Encounter (Signed)
Decision :    Denied reason being  pt has not tried and fail 2 of the preferred drugs .. he has tried one ( Advair Diskus ) but they require 2 ....  ( Notice  was given to PCP  and one copy scanned to chart )

## 2021-06-29 MED ORDER — VARENICLINE TARTRATE 0.5 MG PO TABS: ORAL_TABLET | ORAL | 0 refills | Status: DC

## 2021-06-29 MED ORDER — VARENICLINE TARTRATE 1 MG PO TABS: 1.0000 mg | ORAL_TABLET | Freq: Every day | ORAL | 1 refills | Status: DC

## 2021-06-29 NOTE — Addendum Note (Signed)
Addended by: Elige Radon on: 06/29/2021 04:57 PM   Modules accepted: Orders

## 2021-07-02 DIAGNOSIS — Z419 Encounter for procedure for purposes other than remedying health state, unspecified: Secondary | ICD-10-CM | POA: Diagnosis not present

## 2021-07-04 ENCOUNTER — Other Ambulatory Visit: Payer: Self-pay | Admitting: Internal Medicine

## 2021-07-18 ENCOUNTER — Other Ambulatory Visit (INDEPENDENT_AMBULATORY_CARE_PROVIDER_SITE_OTHER): Payer: Medicaid Other

## 2021-07-18 DIAGNOSIS — I152 Hypertension secondary to endocrine disorders: Secondary | ICD-10-CM

## 2021-07-18 DIAGNOSIS — B351 Tinea unguium: Secondary | ICD-10-CM

## 2021-07-18 DIAGNOSIS — E1159 Type 2 diabetes mellitus with other circulatory complications: Secondary | ICD-10-CM

## 2021-07-19 LAB — CMP14 + ANION GAP
ALT: 21 IU/L (ref 0–44)
AST: 20 IU/L (ref 0–40)
Albumin/Globulin Ratio: 1.8 (ref 1.2–2.2)
Albumin: 4.4 g/dL (ref 3.8–4.9)
Alkaline Phosphatase: 117 IU/L (ref 44–121)
Anion Gap: 17 mmol/L (ref 10.0–18.0)
BUN/Creatinine Ratio: 7 — ABNORMAL LOW (ref 9–20)
BUN: 7 mg/dL (ref 6–24)
Bilirubin Total: 0.3 mg/dL (ref 0.0–1.2)
CO2: 22 mmol/L (ref 20–29)
Calcium: 9.1 mg/dL (ref 8.7–10.2)
Chloride: 102 mmol/L (ref 96–106)
Creatinine, Ser: 0.95 mg/dL (ref 0.76–1.27)
Globulin, Total: 2.4 g/dL (ref 1.5–4.5)
Glucose: 138 mg/dL — ABNORMAL HIGH (ref 70–99)
Potassium: 5.2 mmol/L (ref 3.5–5.2)
Sodium: 141 mmol/L (ref 134–144)
Total Protein: 6.8 g/dL (ref 6.0–8.5)
eGFR: 95 mL/min/{1.73_m2} (ref >59)

## 2021-07-25 ENCOUNTER — Other Ambulatory Visit: Payer: Self-pay | Admitting: Internal Medicine

## 2021-07-25 DIAGNOSIS — B351 Tinea unguium: Secondary | ICD-10-CM

## 2021-07-26 ENCOUNTER — Other Ambulatory Visit: Payer: Self-pay | Admitting: Internal Medicine

## 2021-07-26 DIAGNOSIS — B351 Tinea unguium: Secondary | ICD-10-CM

## 2021-07-26 NOTE — Telephone Encounter (Signed)
This should have lasted him through December. Can you call and ask how he is taking this that he needs a refill?  We should also have him come in for labs in November to check his LFTs with taking this. I'll place the order if you can just let him know to make the lab only appointment.

## 2021-07-26 NOTE — Telephone Encounter (Signed)
Disregard the lab only visit. I see that he had the labs drawn a couple weeks ago. So please just clarify how he is taking this. Thanks.

## 2021-08-02 ENCOUNTER — Other Ambulatory Visit: Payer: Self-pay | Admitting: Internal Medicine

## 2021-08-02 DIAGNOSIS — Z419 Encounter for procedure for purposes other than remedying health state, unspecified: Secondary | ICD-10-CM | POA: Diagnosis not present

## 2021-08-22 ENCOUNTER — Other Ambulatory Visit: Payer: Self-pay | Admitting: Internal Medicine

## 2021-08-22 DIAGNOSIS — Z789 Other specified health status: Secondary | ICD-10-CM

## 2021-08-26 ENCOUNTER — Other Ambulatory Visit: Payer: Self-pay | Admitting: Internal Medicine

## 2021-08-26 DIAGNOSIS — F1090 Alcohol use, unspecified, uncomplicated: Secondary | ICD-10-CM

## 2021-08-26 DIAGNOSIS — Z789 Other specified health status: Secondary | ICD-10-CM

## 2021-09-01 DIAGNOSIS — Z419 Encounter for procedure for purposes other than remedying health state, unspecified: Secondary | ICD-10-CM | POA: Diagnosis not present

## 2021-09-08 ENCOUNTER — Other Ambulatory Visit: Payer: Self-pay | Admitting: Internal Medicine

## 2021-09-08 DIAGNOSIS — E1159 Type 2 diabetes mellitus with other circulatory complications: Secondary | ICD-10-CM

## 2021-09-16 ENCOUNTER — Other Ambulatory Visit: Payer: Self-pay | Admitting: Internal Medicine

## 2021-09-16 DIAGNOSIS — G4700 Insomnia, unspecified: Secondary | ICD-10-CM

## 2021-09-19 ENCOUNTER — Other Ambulatory Visit: Payer: Self-pay | Admitting: Internal Medicine

## 2021-09-19 DIAGNOSIS — J449 Chronic obstructive pulmonary disease, unspecified: Secondary | ICD-10-CM

## 2021-09-20 NOTE — Assessment & Plan Note (Signed)
Trelegy denied due to not failing 2 other agents. Will remove from his medication list. Continue stioloto. Will re-evaluate its effectiveness at his upcoming office visit with me in January.

## 2021-09-27 ENCOUNTER — Other Ambulatory Visit: Payer: Self-pay | Admitting: Internal Medicine

## 2021-10-02 DIAGNOSIS — Z419 Encounter for procedure for purposes other than remedying health state, unspecified: Secondary | ICD-10-CM | POA: Diagnosis not present

## 2021-10-06 ENCOUNTER — Other Ambulatory Visit: Payer: Self-pay | Admitting: Internal Medicine

## 2021-10-06 DIAGNOSIS — E119 Type 2 diabetes mellitus without complications: Secondary | ICD-10-CM | POA: Diagnosis not present

## 2021-10-06 DIAGNOSIS — H524 Presbyopia: Secondary | ICD-10-CM | POA: Diagnosis not present

## 2021-10-06 DIAGNOSIS — H538 Other visual disturbances: Secondary | ICD-10-CM | POA: Diagnosis not present

## 2021-10-06 DIAGNOSIS — H5213 Myopia, bilateral: Secondary | ICD-10-CM | POA: Diagnosis not present

## 2021-10-06 DIAGNOSIS — H2513 Age-related nuclear cataract, bilateral: Secondary | ICD-10-CM | POA: Diagnosis not present

## 2021-10-06 DIAGNOSIS — H52223 Regular astigmatism, bilateral: Secondary | ICD-10-CM | POA: Diagnosis not present

## 2021-10-06 DIAGNOSIS — H1045 Other chronic allergic conjunctivitis: Secondary | ICD-10-CM | POA: Diagnosis not present

## 2021-10-06 DIAGNOSIS — H35033 Hypertensive retinopathy, bilateral: Secondary | ICD-10-CM | POA: Diagnosis not present

## 2021-10-06 LAB — HM DIABETES EYE EXAM

## 2021-10-11 ENCOUNTER — Other Ambulatory Visit: Payer: Self-pay | Admitting: Podiatry

## 2021-10-11 ENCOUNTER — Other Ambulatory Visit: Payer: Self-pay | Admitting: Internal Medicine

## 2021-10-13 ENCOUNTER — Encounter: Payer: Medicaid Other | Admitting: Internal Medicine

## 2021-10-13 DIAGNOSIS — I152 Hypertension secondary to endocrine disorders: Secondary | ICD-10-CM

## 2021-10-13 DIAGNOSIS — E119 Type 2 diabetes mellitus without complications: Secondary | ICD-10-CM

## 2021-10-19 ENCOUNTER — Other Ambulatory Visit: Payer: Self-pay

## 2021-10-19 ENCOUNTER — Ambulatory Visit: Payer: Medicaid Other | Admitting: Podiatry

## 2021-10-19 ENCOUNTER — Encounter: Payer: Self-pay | Admitting: Podiatry

## 2021-10-19 DIAGNOSIS — M79674 Pain in right toe(s): Secondary | ICD-10-CM | POA: Diagnosis not present

## 2021-10-19 DIAGNOSIS — B351 Tinea unguium: Secondary | ICD-10-CM

## 2021-10-19 DIAGNOSIS — G629 Polyneuropathy, unspecified: Secondary | ICD-10-CM

## 2021-10-19 DIAGNOSIS — I739 Peripheral vascular disease, unspecified: Secondary | ICD-10-CM

## 2021-10-19 DIAGNOSIS — M79675 Pain in left toe(s): Secondary | ICD-10-CM

## 2021-10-19 DIAGNOSIS — E1142 Type 2 diabetes mellitus with diabetic polyneuropathy: Secondary | ICD-10-CM

## 2021-10-19 NOTE — Progress Notes (Signed)
This patient returns to my office for at risk foot care.  This patient requires this care by a professional since this patient will be at risk due to having type 2 diabetes.  This patient is unable to cut nails himself since the patient cannot reach his nails.These nails are painful walking and wearing shoes.  This patient presents for at risk foot care today.  General Appearance  Alert, conversant and in no acute stress.  Vascular  Dorsalis pedis and posterior tibial  pulses are palpable  bilaterally.  Capillary return is within normal limits  bilaterally. Temperature is within normal limits  bilaterally.  Neurologic  Senn-Weinstein monofilament wire test within normal limits  bilaterally. Muscle power within normal limits bilaterally.  Nails Thick disfigured discolored nails with subungual debris  from hallux to fifth toes bilaterally. No evidence of bacterial infection or drainage bilaterally.  Orthopedic  No limitations of motion  feet .  No crepitus or effusions noted.  No bony pathology or digital deformities noted.  Skin  normotropic skin with no porokeratosis noted bilaterally.  No signs of infections or ulcers noted.     Onychomycosis  Pain in right toes  Pain in left toes  Consent was obtained for treatment procedures.   Mechanical debridement of nails 1-5  bilaterally performed with a nail nipper.  Filed with dremel without incident.    Return office visit     6 months                 Told patient to return for periodic foot care and evaluation due to potential at risk complications.   Gennavieve Huq DPM   

## 2021-10-20 ENCOUNTER — Other Ambulatory Visit: Payer: Self-pay | Admitting: Internal Medicine

## 2021-10-20 ENCOUNTER — Ambulatory Visit (INDEPENDENT_AMBULATORY_CARE_PROVIDER_SITE_OTHER): Payer: Medicaid Other | Admitting: Internal Medicine

## 2021-10-20 ENCOUNTER — Encounter: Payer: Self-pay | Admitting: Internal Medicine

## 2021-10-20 VITALS — BP 139/83 | HR 113 | Temp 98.2°F | Wt 204.7 lb

## 2021-10-20 DIAGNOSIS — E785 Hyperlipidemia, unspecified: Secondary | ICD-10-CM

## 2021-10-20 DIAGNOSIS — E1159 Type 2 diabetes mellitus with other circulatory complications: Secondary | ICD-10-CM | POA: Diagnosis not present

## 2021-10-20 DIAGNOSIS — F1011 Alcohol abuse, in remission: Secondary | ICD-10-CM

## 2021-10-20 DIAGNOSIS — E611 Iron deficiency: Secondary | ICD-10-CM

## 2021-10-20 DIAGNOSIS — E119 Type 2 diabetes mellitus without complications: Secondary | ICD-10-CM

## 2021-10-20 DIAGNOSIS — J449 Chronic obstructive pulmonary disease, unspecified: Secondary | ICD-10-CM

## 2021-10-20 DIAGNOSIS — K219 Gastro-esophageal reflux disease without esophagitis: Secondary | ICD-10-CM | POA: Diagnosis not present

## 2021-10-20 DIAGNOSIS — Z23 Encounter for immunization: Secondary | ICD-10-CM

## 2021-10-20 DIAGNOSIS — E1169 Type 2 diabetes mellitus with other specified complication: Secondary | ICD-10-CM

## 2021-10-20 DIAGNOSIS — I152 Hypertension secondary to endocrine disorders: Secondary | ICD-10-CM

## 2021-10-20 LAB — POCT GLYCOSYLATED HEMOGLOBIN (HGB A1C): Hemoglobin A1C: 7.7 % — AB (ref 4.0–5.6)

## 2021-10-20 LAB — GLUCOSE, CAPILLARY: Glucose-Capillary: 220 mg/dL — ABNORMAL HIGH (ref 70–99)

## 2021-10-20 MED ORDER — CANAGLIFLOZIN 300 MG PO TABS: 300.0000 mg | ORAL_TABLET | Freq: Every day | ORAL | 0 refills | Status: DC

## 2021-10-20 MED ORDER — CANAGLIFLOZIN 100 MG PO TABS: 100.0000 mg | ORAL_TABLET | Freq: Every day | ORAL | 0 refills | Status: DC

## 2021-10-20 NOTE — Addendum Note (Signed)
Addended by: Bufford Spikes on: 10/20/2021 08:46 AM   Modules accepted: Orders

## 2021-10-20 NOTE — Patient Instructions (Addendum)
Mr.Jaskirat Carrejo, it was a pleasure seeing you today!  Today we discussed:  COPD. Congratulations on cutting back smoking! I am proud of you and keep up the hard work! -REMEMBER to put the medicine into the Stiolato device and use it daily -Let us know if the Stiolato is still not working after you put the medicine in it.  Alcohol use. I am so proud of you for stopping drinking! Keep it up and let me know if there is anything I can do to help.  Iron deficiency -Start taking a daily iron supplement -We will recheck your iron level when you come back to see me in 3 months  Diabetes. Your A1C is up to 7.7 today.  -Start taking Invokana -Continue taking metformin  Follow-up: 3 months   Please make sure to arrive 15 minutes prior to your next appointment. If you arrive late, you may be asked to reschedule.   We look forward to seeing you next time. Please call our clinic at [phone] if you have any questions or concerns. The best time to call is Monday-Friday from 9am-4pm, but there is someone available 24/7. If after hours or the weekend, call the main hospital number and ask for the Internal Medicine Resident On-Call. If you need medication refills, please notify your pharmacy one week in advance and they will send [place] a request.  Thank you for letting us take part in your care. Wishing you the best!

## 2021-10-21 ENCOUNTER — Encounter: Payer: Self-pay | Admitting: Internal Medicine

## 2021-10-21 MED ORDER — ATORVASTATIN CALCIUM 80 MG PO TABS: 80.0000 mg | ORAL_TABLET | Freq: Every day | ORAL | 2 refills | Status: DC

## 2021-10-21 MED ORDER — STIOLTO RESPIMAT 2.5-2.5 MCG/ACT IN AERS: 2.0000 | INHALATION_SPRAY | Freq: Every day | RESPIRATORY_TRACT | 1 refills | Status: DC

## 2021-10-21 MED ORDER — VARENICLINE TARTRATE 1 MG PO TABS: 1.0000 mg | ORAL_TABLET | Freq: Two times a day (BID) | ORAL | 0 refills | Status: DC

## 2021-10-21 NOTE — Assessment & Plan Note (Addendum)
Current medications: metformin 1000mg  BID Slight increase in A1C since September from 6.5>>7.7. he endorses medication adherence, congruent with dispense report.  Last foot exam 04/2021 Retinopathy screening due. Referral place 06/2021--he has not followed up with them in the interm. Encouraged he do this again today.   Will add invokana to his diabetic regimen for additional CV benefits.  Continue metformin and lipitor  Follow up April/May 2023

## 2021-10-21 NOTE — Assessment & Plan Note (Signed)
He reports that he has undergone complete alcohol cessation since his last office visit. No alcohol use since 12/26. Pt congratulated and encouraged to continue.  Continue naltrexone 50mg  daily

## 2021-10-21 NOTE — Assessment & Plan Note (Addendum)
COPD follow up: Current medications: stiolto, atrovent Med Adherence:  [x]  Yes    []  No Symptom control: []  Yes    [x]  No  CAT score 19  # of exacerbations in past 34mo: 0 Smoking history: [x]  Yes    []  No     Currently smoking: [x]  Yes    []  No  0.5pack per day--unchanged from prior Lung cancer screening in the past 12 months:  []  Yes    [x]  No  -ordered but not performed Notes: He reported that he did not feel that he gets anything with inhaling the stiolato. I asked him to demonstrate use at which time I noticed that there was no mist. I had Rachelle stop by to look at the device and we discovered that there was no cartridge inserted into it, hence the reason no medication was being dispensed. Rachelle assisted him with walking through the necessary steps of loading the cartridge. Plan:   Will have him continue to use stiolato. His sister in law was present at today's appointment and does know how to load the cartridges from prior experience with her own. Garris was encouraged to reach out to our office if he continues to have issues with the inhaler and we will have him come in for an appointment with Rachelle.   Encouraged complete smoking cessation  We have discussed LDCT and PFTs numerous times however he has continued to not follow up on these. He understands the reasoning of these. I will defer re-ordering these today.  Increase chantix to 1mg  BID, continue nicotine patches  Follow up in April/May with me

## 2021-10-21 NOTE — Assessment & Plan Note (Signed)
Current medications: enalapril 10mg  daily Endorses medication compliance, congruent with dispense report. Denies adverse medication reactions. Blood pressure is at goal in the office today--139/83  continue current management

## 2021-10-22 ENCOUNTER — Encounter: Payer: Self-pay | Admitting: Internal Medicine

## 2021-10-22 NOTE — Progress Notes (Addendum)
Office Visit   Patient ID: Ruben Pierce, male    DOB: Jan 06, 1966, 56 y.o.   MRN: [id number]   PCP: Ruben Radon, MD   Subjective:  Ruben Pierce is a 56 y.o. year old male who presents for diabetes, hypertension, hyperlipidemia, tobacco use disorder, alcohol use disorder follow up.   LOV with me was 06/27/21. We had attempted to escalate his COPD maintenance regimen to trelegy however this was declined by medicaid due to needing to try another alternative other than advair. He also underwent a course of terbinafine for treatment of onychomycosis.  Interm history 10/19/21: podiatry--nail debridement--66mo follow up  He is not having any acute issues today. No significant improvement in COPD symptoms. Still smoking about 1/2 pack of cigarettes per day.  Denies anginal symptoms.  ACTIVE MEDICATIONS   Outpatient Medications Prior to Visit  Medication Sig   GNP NICOTINE 14 MG/24HR patch Place 1 patch (14 mg total) onto the skin daily.   ATROVENT HFA 17 MCG/ACT inhaler Inhale 2 puffs into the lungs 4 (four) times daily.   diclofenac Sodium (VOLTAREN) 1 % GEL APPLY TWO GRAMS TOPICALLY FOUR TIMES DAILY. RUB INTO THE AFFECTED AREA OF FOOT TWO - FOUR TIMES DAILY   enalapril (VASOTEC) 10 MG tablet Take 1 tablet (10 mg total) by mouth daily.   famotidine (PEPCID) 20 MG tablet Take 1 tablet (20 mg total) by mouth 2 (two) times daily.   glucose blood (ACCU-CHEK AVIVA PLUS) test strip Use as instructed   Lancets (FREESTYLE) lancets Use as instructed   metFORMIN (GLUCOPHAGE) 1000 MG tablet Take 1 tablet (1,000 mg total) by mouth 2 (two) times daily with a meal.   naltrexone (DEPADE) 50 MG tablet Take 1 tablet (50 mg total) by mouth daily.   nitroGLYCERIN (NITROSTAT) 0.4 MG SL tablet Place 1 tablet (0.4 mg total) under the tongue every 5 (five) minutes as needed for chest pain. If chest pain does not resolve after 2 tablets, please obtain evaluation in the ER.   sildenafil (VIAGRA) 50 MG tablet Take  1 tablet (50 mg total) by mouth as needed for erectile dysfunction.   [DISCONTINUED] atorvastatin (LIPITOR) 80 MG tablet Take 1 tablet (80 mg total) by mouth daily.   [DISCONTINUED] STIOLTO RESPIMAT 2.5-2.5 MCG/ACT AERS Inhale 2 puffs into the lungs daily.   [DISCONTINUED] varenicline (CHANTIX) 0.5 MG tablet Take one 0.5mg  tablet by mouth daily for 7 days followed by two 0.5mg  tablets by mouth daily thereafter.   [DISCONTINUED] varenicline (CHANTIX) 1 MG tablet Take 1 tablet (1 mg total) by mouth daily.   No facility-administered medications prior to visit.     Objective:   BP 139/83 (BP Location: Left Arm, Patient Position: Sitting, Cuff Size: Normal)    Pulse (!) 113    Temp 98.2 F (36.8 C) (Oral)    Wt 204 lb 11.2 oz (92.9 kg)    SpO2 99%    BMI 27.01 kg/m  Wt Readings from Last 3 Encounters:  10/20/21 204 lb 11.2 oz (92.9 kg)  06/27/21 192 lb 9.6 oz (87.4 kg)  03/31/21 184 lb 11.2 oz (83.8 kg)    BP Readings from Last 3 Encounters:  10/20/21 139/83  06/27/21 133/84  03/31/21 128/81   General: well appearing, no distress Cardiac: RRR Pulm: diminished lung sounds throughout, no wheezes  Assessment & Plan:    Hypertension associated with diabetes (HCC) (Chronic)    Current medications: enalapril 10mg  daily Endorses medication compliance, congruent with dispense report. Denies adverse medication reactions.  Blood pressure is at goal in the office today--139/83 continue current management       COPD (chronic obstructive pulmonary disease) (HCC) (Chronic), Tobacco use disorder (Chronic)    Chronic and stable but still not ideally controlled Current medications: stiolto, atrovent Med Adherence:  [x]  Yes    []  No Symptom control: []  Yes    [x]  No  CAT score 19   # of exacerbations in past 55mo: 0 Smoking history: [x]  Yes    []  No   Currently smoking: [x]  Yes    []  No  0.5pack per day--unchanged from prior Lung cancer screening in the past 12 months:  []  Yes    [x]  No   -ordered but not performed Notes: He reported that he did not feel that he gets anything with inhaling the stiolato. I asked him to demonstrate use at which time I noticed that there was no mist. I had Ruben Pierce stop by to look at the device and we discovered that there was no cartridge inserted into it, hence the reason no medication was being dispensed. Ruben Pierce assisted him with walking through the necessary steps of loading the cartridge. Plan:  Will have him continue to use stiolato. His sister in law was present at today's appointment and does know how to load the cartridges from prior experience with her own. Ruben Pierce was encouraged to reach out to our office if he continues to have issues with the inhaler and we will have him come in for an appointment with Ruben Pierce. If COPD symptoms are still not well controlled at follow up in 55 months, will plan to proceed with Trelegy Prn albuterol Increase chantix to 1mg  BID, continue nicotine patches Encouraged complete smoking cessation We have discussed LDCT and PFTs numerous times however he has continued to not follow up on these. He understands the reasoning of these. I will defer re-ordering these today. Follow up in April/May with me      Type 2 diabetes mellitus (HCC) - Primary (Chronic)    Current medications: metformin 1000mg  BID Slight increase in A1C since September from 6.5>>7.7. he endorses medication adherence, congruent with dispense report.  Last foot exam 04/2021 Retinopathy screening due. Referral place 06/2021--he has not followed up with them in the interm. Encouraged he do this again today.  Will add invokana to his diabetic regimen for additional CV benefits. Continue metformin and lipitor Follow up April/May 2023      Alcohol use disorder, mild, in early remission (Chronic)    He reports that he has undergone complete alcohol cessation since his last office visit. No alcohol use since 12/26. Pt congratulated and encouraged to  continue. Continue naltrexone 50mg  daily       Iron deficiency    Still no overt bleeding. Last colonoscopy was 2021--no polyps found although colon prep was noted to be poor, no recommendation from GI to repeat the colonoscopy at that time. Question relation to alcohol use vs nutrition.  He has not been taking iron supplementation as we had previously discussed so we will defer repeat testing today.  He will start oral iron and we will plan on repeating studies NOV. If still iron deficient, may need to consider referring back to GI for repeat scope       Need for immunization against influenza       Relevant Orders   Flu Vaccine QUAD 7473mo+IM (Fluarix, Fluzone & Alfiuria Quad PF) (Completed)   Return in about 3 months (around 01/18/2022). -CBC, CMP, iron studies  Pt discussed with Dr. Tori Milks, MD Internal Medicine Resident PGY-2 [facility] 10/22/2021 9:01 AM

## 2021-10-22 NOTE — Assessment & Plan Note (Addendum)
Still no overt bleeding. Last colonoscopy was 2021--no polyps found although colon prep was noted to be poor, no recommendation from GI to repeat the colonoscopy at that time. Question relation to alcohol use vs nutrition.  He has not been taking iron supplementation as we had previously discussed so we will defer repeat testing today.  He will start oral iron and we will plan on repeating studies NOV. If still iron deficient, may need to consider referring back to GI for repeat scope

## 2021-10-24 NOTE — Progress Notes (Signed)
Internal Medicine Clinic Attending  Case discussed with Dr. Christian  At the time of the visit.  We reviewed the resident's history and exam and pertinent patient test results.  I agree with the assessment, diagnosis, and plan of care documented in the resident's note.  

## 2021-11-02 DIAGNOSIS — Z419 Encounter for procedure for purposes other than remedying health state, unspecified: Secondary | ICD-10-CM | POA: Diagnosis not present

## 2021-11-10 ENCOUNTER — Encounter (HOSPITAL_COMMUNITY): Payer: Self-pay | Admitting: Emergency Medicine

## 2021-11-10 ENCOUNTER — Emergency Department (HOSPITAL_COMMUNITY)
Admission: EM | Admit: 2021-11-10 | Discharge: 2021-11-11 | Disposition: A | Discharge status: Home / Self Care | Payer: Medicaid Other | Attending: Emergency Medicine | Admitting: Emergency Medicine

## 2021-11-10 ENCOUNTER — Emergency Department (HOSPITAL_COMMUNITY): Payer: Medicaid Other

## 2021-11-10 DIAGNOSIS — Z7984 Long term (current) use of oral hypoglycemic drugs: Secondary | ICD-10-CM | POA: Diagnosis not present

## 2021-11-10 DIAGNOSIS — E119 Type 2 diabetes mellitus without complications: Secondary | ICD-10-CM | POA: Insufficient documentation

## 2021-11-10 DIAGNOSIS — F29 Unspecified psychosis not due to a substance or known physiological condition: Secondary | ICD-10-CM | POA: Diagnosis not present

## 2021-11-10 DIAGNOSIS — W19XXXA Unspecified fall, initial encounter: Secondary | ICD-10-CM

## 2021-11-10 DIAGNOSIS — R791 Abnormal coagulation profile: Secondary | ICD-10-CM | POA: Insufficient documentation

## 2021-11-10 DIAGNOSIS — Z043 Encounter for examination and observation following other accident: Secondary | ICD-10-CM | POA: Diagnosis not present

## 2021-11-10 DIAGNOSIS — R000 Tachycardia, unspecified: Secondary | ICD-10-CM | POA: Diagnosis not present

## 2021-11-10 DIAGNOSIS — F10921 Alcohol use, unspecified with intoxication delirium: Secondary | ICD-10-CM | POA: Diagnosis not present

## 2021-11-10 DIAGNOSIS — M542 Cervicalgia: Secondary | ICD-10-CM | POA: Insufficient documentation

## 2021-11-10 DIAGNOSIS — Y907 Blood alcohol level of 200-239 mg/100 ml: Secondary | ICD-10-CM | POA: Diagnosis not present

## 2021-11-10 DIAGNOSIS — S0990XA Unspecified injury of head, initial encounter: Secondary | ICD-10-CM | POA: Diagnosis not present

## 2021-11-10 DIAGNOSIS — I10 Essential (primary) hypertension: Secondary | ICD-10-CM | POA: Diagnosis not present

## 2021-11-10 DIAGNOSIS — Z743 Need for continuous supervision: Secondary | ICD-10-CM | POA: Diagnosis not present

## 2021-11-10 DIAGNOSIS — R410 Disorientation, unspecified: Secondary | ICD-10-CM | POA: Diagnosis not present

## 2021-11-10 LAB — COMPREHENSIVE METABOLIC PANEL
ALT: 19 U/L (ref 0–44)
AST: 23 U/L (ref 15–41)
Albumin: 4.2 g/dL (ref 3.5–5.0)
Alkaline Phosphatase: 118 U/L (ref 38–126)
Anion gap: 13 (ref 5–15)
BUN: 11 mg/dL (ref 6–20)
CO2: 22 mmol/L (ref 22–32)
Calcium: 9.3 mg/dL (ref 8.9–10.3)
Chloride: 100 mmol/L (ref 98–111)
Creatinine, Ser: 0.91 mg/dL (ref 0.61–1.24)
GFR, Estimated: >60 mL/min (ref >60)
Glucose, Bld: 123 mg/dL — ABNORMAL HIGH (ref 70–99)
Potassium: 3.8 mmol/L (ref 3.5–5.1)
Sodium: 135 mmol/L (ref 135–145)
Total Bilirubin: 0.3 mg/dL (ref 0.3–1.2)
Total Protein: 7 g/dL (ref 6.5–8.1)

## 2021-11-10 LAB — CBC WITH DIFFERENTIAL/PLATELET
Abs Immature Granulocytes: 0.12 K/uL — ABNORMAL HIGH (ref 0.00–0.07)
Basophils Absolute: 0.1 K/uL (ref 0.0–0.1)
Basophils Relative: 1 %
Eosinophils Absolute: 0.1 K/uL (ref 0.0–0.5)
Eosinophils Relative: 1 %
HCT: 40 % (ref 39.0–52.0)
Hemoglobin: 13.3 g/dL (ref 13.0–17.0)
Immature Granulocytes: 1 %
Lymphocytes Relative: 44 %
Lymphs Abs: 4 K/uL (ref 0.7–4.0)
MCH: 26.8 pg (ref 26.0–34.0)
MCHC: 33.3 g/dL (ref 30.0–36.0)
MCV: 80.6 fL (ref 80.0–100.0)
Monocytes Absolute: 0.6 K/uL (ref 0.1–1.0)
Monocytes Relative: 7 %
Neutro Abs: 4.1 K/uL (ref 1.7–7.7)
Neutrophils Relative %: 46 %
Platelets: 265 K/uL (ref 150–400)
RBC: 4.96 MIL/uL (ref 4.22–5.81)
RDW: 13.7 % (ref 11.5–15.5)
WBC: 9 K/uL (ref 4.0–10.5)
nRBC: 0 % (ref 0.0–0.2)

## 2021-11-10 LAB — PROTIME-INR
INR: 1 (ref 0.8–1.2)
Prothrombin Time: 12.7 seconds (ref 11.4–15.2)

## 2021-11-10 LAB — TROPONIN I (HIGH SENSITIVITY): Troponin I (High Sensitivity): 6 ng/L

## 2021-11-10 LAB — CBG MONITORING, ED: Glucose-Capillary: 124 mg/dL — ABNORMAL HIGH (ref 70–99)

## 2021-11-10 LAB — ETHANOL: Alcohol, Ethyl (B): 213 mg/dL — ABNORMAL HIGH

## 2021-11-10 MED ORDER — SODIUM CHLORIDE 0.9 % IV BOLUS
500.0000 mL | Freq: Once | INTRAVENOUS | Status: AC
Start: 2021-11-10 — End: 2021-11-10
  Administered 2021-11-10: 500 mL via INTRAVENOUS

## 2021-11-10 NOTE — ED Triage Notes (Signed)
Pt bib EMS from home and fell of porch, hit head with ETOH on board. C/o headache. Pt was very combative with FD and EMS, given 5mg  versed IM given for sedation and cooperation. Unable to state name, place, date. Moving all extremities without issue. Arrives in c-collar.   160/110 HR 98 NSR RR 16 96% room air CBG 107

## 2021-11-10 NOTE — Discharge Instructions (Addendum)
Continue medications as previously prescribed.  Follow-up with primary doctor and return to the emergency department if you experience any additional problems.

## 2021-11-10 NOTE — ED Notes (Signed)
Pt oxygen saturation 88-89% on room air while asleep. Placed on 3L nasal cannula, oxygen increased to 99%.

## 2021-11-10 NOTE — ED Notes (Signed)
Patient transported to CT 

## 2021-11-10 NOTE — ED Notes (Addendum)
Pt awake in room, c/o need to urinate. RN assisted pt with using urinal. 1L of urine output. Calm and cooperative at this time.

## 2021-11-10 NOTE — ED Provider Notes (Signed)
[facility] EMERGENCY DEPARTMENT  Provider Note  CSN: [id number] Arrival date & time: 11/10/21 2032  History Chief Complaint  Patient presents with   Ruben Pierce is a 56 y.o. male with history of alcohol abuse and DM brought to the ED via EMS who provide the history. Patient was reportedly drinking heavily today when he fell off the porch. May have hit his head. He was complaining to them of neck pain as well. He was combative with EMS and was given Versed 5mg  IM and a c-collar was placed. He is unable to give any history now.    Home Medications Prior to Admission medications   Medication Sig Start Date End Date Taking? Authorizing Provider  GNP NICOTINE 14 MG/24HR patch Place 1 patch (14 mg total) onto the skin daily. 09/27/21   09/29/21, MD  atorvastatin (LIPITOR) 80 MG tablet Take 1 tablet (80 mg total) by mouth daily. 10/21/21   10/23/21, MD  ATROVENT HFA 17 MCG/ACT inhaler Inhale 2 puffs into the lungs 4 (four) times daily. 10/20/21   10/22/21, MD  canagliflozin (INVOKANA) 100 MG TABS tablet Take 1 tablet (100 mg total) by mouth daily before breakfast. 10/20/21 01/18/22  01/20/22, MD  canagliflozin (INVOKANA) 300 MG TABS tablet Take 1 tablet (300 mg total) by mouth daily before breakfast. 11/20/21   11/22/21, Rylee, MD  diclofenac Sodium (VOLTAREN) 1 % GEL APPLY TWO GRAMS TOPICALLY FOUR TIMES DAILY. RUB INTO THE AFFECTED AREA OF FOOT TWO - FOUR TIMES DAILY 10/11/21   12/09/21, DPM  enalapril (VASOTEC) 10 MG tablet Take 1 tablet (10 mg total) by mouth daily. 06/08/21   08/08/21, MD  famotidine (PEPCID) 20 MG tablet Take 1 tablet (20 mg total) by mouth 2 (two) times daily. 06/08/21 06/08/22  08/08/22, MD  glucose blood (ACCU-CHEK AVIVA PLUS) test strip Use as instructed 11/10/20   01/08/21, MD  Lancets (FREESTYLE) lancets Use as instructed 10/28/19   10/30/19, MD  metFORMIN (GLUCOPHAGE) 1000 MG  tablet Take 1 tablet (1,000 mg total) by mouth 2 (two) times daily with a meal. 06/27/21   Christian, Rylee, MD  naltrexone (DEPADE) 50 MG tablet Take 1 tablet (50 mg total) by mouth daily. 08/24/21   08/26/21, MD  nitroGLYCERIN (NITROSTAT) 0.4 MG SL tablet Place 1 tablet (0.4 mg total) under the tongue every 5 (five) minutes as needed for chest pain. If chest pain does not resolve after 2 tablets, please obtain evaluation in the ER. 06/28/20 06/28/21  06/30/21, MD  sildenafil (VIAGRA) 50 MG tablet Take 1 tablet (50 mg total) by mouth as needed for erectile dysfunction. 09/15/20 09/15/21  09/17/21, MD  Tiotropium Bromide-Olodaterol (STIOLTO RESPIMAT) 2.5-2.5 MCG/ACT AERS Inhale 2 puffs into the lungs daily. 10/21/21   10/23/21, MD  varenicline (CHANTIX) 1 MG tablet Take 1 tablet (1 mg total) by mouth 2 (two) times daily. 10/21/21   10/23/21, MD     Allergies    Patient has no known allergies.   Review of Systems   Review of Systems Please see HPI for pertinent positives and negatives  Physical Exam BP 120/89    Pulse 100    Temp 98.2 F (36.8 C) (Oral)    Resp 20    Ht 6\' 1"  (1.854 m)    Wt 90.7 kg    SpO2 93%    BMI 26.39 kg/m   Physical Exam Vitals  and nursing note reviewed.  Constitutional:      Appearance: Normal appearance.  HENT:     Head: Normocephalic and atraumatic.     Nose: Nose normal.     Mouth/Throat:     Mouth: Mucous membranes are moist.  Eyes:     Extraocular Movements: Extraocular movements intact.     Conjunctiva/sclera: Conjunctivae normal.     Pupils: Pupils are equal, round, and reactive to light.  Neck:     Comments: In c-collar Cardiovascular:     Rate and Rhythm: Normal rate.  Pulmonary:     Effort: Pulmonary effort is normal.     Breath sounds: Normal breath sounds.  Abdominal:     General: Abdomen is flat.     Palpations: Abdomen is soft.     Tenderness: There is no abdominal tenderness.  Musculoskeletal:         General: No swelling. Normal range of motion.  Skin:    General: Skin is warm and dry.  Neurological:     Comments: Obtunded, mumbles incomprehensibly. Moves all four extremities.   Psychiatric:     Comments: Unable to assess    ED Results / Procedures / Treatments   EKG EKG Interpretation  Date/Time:  Thursday November 10 2021 20:39:16 EST Ventricular Rate:  103 PR Interval:  166 QRS Duration: 113 QT Interval:  340 QTC Calculation: 445 R Axis:   62 Text Interpretation: Sinus tachycardia Incomplete right bundle branch block ST depr, consider ischemia, inferior leads Minimal ST elevation, lateral leads Since last tracing Non-specific ST-t changes Confirmed by Susy Frizzle [phone]) on 11/10/2021 9:24:00 PM  Procedures Procedures  Medications Ordered in the ED Medications  sodium chloride 0.9 % bolus 500 mL (0 mLs Intravenous Stopped 11/10/21 2137)    Initial Impression and Plan  Patient with reported fall from porch, intoxicated and combative. Given Versed enroute. Will send for CT, check labs and continue to monitor. Sinus tachy on the monitor at this time.   ED Course   Clinical Course as of 11/10/21 2314  Thu Nov 10, 2021  2131 CBC is normal, CMP is unremarkable. EtOH is elevated. EKG with some nonspecific ST changes, given patient cannot give any history, will check Trops as well.  [CS]  2149 CT head and Cspine neg for acute injury. Will continue to monitor for sobering.  [CS]  2213 Initial Trop is neg. Will recheck a delta while he sobers.  [CS]  2311 Patient more arousable, but sleeping and still slurring words. Will continue to monitor for sobering. Care of the patient signed out to Dr. Judd Lien at the change of shift.  [CS]    Clinical Course User Index [CS] Pollyann Savoy, MD     MDM Rules/Calculators/A&P Medical Decision Making Problems Addressed: Alcohol intoxication with delirium [facility]): acute illness or injury that poses a threat to life or bodily  functions Fall, initial encounter: complicated acute illness or injury  Amount and/or Complexity of Data Reviewed Labs: ordered. Decision-making details documented in ED Course. Radiology: ordered and independent interpretation performed. Decision-making details documented in ED Course. ECG/medicine tests: ordered and independent interpretation performed. Decision-making details documented in ED Course.    Final Clinical Impression(s) / ED Diagnoses Final diagnoses:  Fall, initial encounter  Alcohol intoxication with delirium [facility])    Rx / DC Orders ED Discharge Orders     None        Pollyann Savoy, MD 11/10/21 2314

## 2021-11-11 LAB — TROPONIN I (HIGH SENSITIVITY): Troponin I (High Sensitivity): 6 ng/L (ref <18)

## 2021-11-11 NOTE — ED Notes (Signed)
Pt alert, oriented, and able to converse with this RN at this time. Cooperative for troponin specimen collection.

## 2021-11-11 NOTE — ED Notes (Signed)
Pt's significant other, [name], spoke with this RN on the phone and confirmed that she will be able to pick up pt from ED at 0800, unable to arrive earlier due to poor vision in the dark.

## 2021-11-11 NOTE — ED Provider Notes (Signed)
°  Physical Exam  BP (!) 168/105    Pulse 99    Temp 98.2 F (36.8 C) (Oral)    Resp 18    Ht 6\' 1"  (1.854 m)    Wt 90.7 kg    SpO2 98%    BMI 26.39 kg/m   Physical Exam Vitals and nursing note reviewed.  Constitutional:      Appearance: Normal appearance.  HENT:     Head: Normocephalic and atraumatic.  Pulmonary:     Effort: Pulmonary effort is normal.  Musculoskeletal:        General: Normal range of motion.  Neurological:     General: No focal deficit present.     Mental Status: He is alert and oriented to person, place, and time.    Procedures  Procedures  ED Course / MDM   Clinical Course as of 11/11/21 0647  Thu Nov 10, 2021  2131 CBC is normal, CMP is unremarkable. EtOH is elevated. EKG with some nonspecific ST changes, given patient cannot give any history, will check Trops as well.  [CS]  2149 CT head and Cspine neg for acute injury. Will continue to monitor for sobering.  [CS]  2213 Initial Trop is neg. Will recheck a delta while he sobers.  [CS]  2311 Patient more arousable, but sleeping and still slurring words. Will continue to monitor for sobering. Care of the patient signed out to Dr. 2312 at the change of shift.  [CS]    Clinical Course User Index [CS] Judd Lien, MD   Care assumed from Dr. Pollyann Savoy at shift change.  Patient presents here after a fall while intoxicated.  His imaging studies are unremarkable.  Laboratory studies are unremarkable.  EtOH level is 213.  Patient allowed to metabolize here in the ER.  He is now awake and alert and seems appropriate for discharge.       Bernette Mayers, MD 11/11/21 [phone]

## 2021-11-11 NOTE — ED Notes (Signed)
Assumed pt care at this time

## 2021-11-15 ENCOUNTER — Other Ambulatory Visit: Payer: Self-pay | Admitting: Internal Medicine

## 2021-11-15 DIAGNOSIS — K219 Gastro-esophageal reflux disease without esophagitis: Secondary | ICD-10-CM

## 2021-11-15 DIAGNOSIS — E119 Type 2 diabetes mellitus without complications: Secondary | ICD-10-CM

## 2021-11-15 DIAGNOSIS — Z789 Other specified health status: Secondary | ICD-10-CM

## 2021-11-15 MED ORDER — CANAGLIFLOZIN 300 MG PO TABS: 300.0000 mg | ORAL_TABLET | Freq: Every day | ORAL | 1 refills | Status: DC

## 2021-11-17 ENCOUNTER — Other Ambulatory Visit: Payer: Self-pay | Admitting: Podiatry

## 2021-11-30 DIAGNOSIS — Z419 Encounter for procedure for purposes other than remedying health state, unspecified: Secondary | ICD-10-CM | POA: Diagnosis not present

## 2021-12-05 ENCOUNTER — Other Ambulatory Visit: Payer: Self-pay

## 2021-12-05 DIAGNOSIS — E119 Type 2 diabetes mellitus without complications: Secondary | ICD-10-CM

## 2021-12-05 MED ORDER — FREESTYLE LANCETS MISC: 12 refills | Status: DC

## 2021-12-05 MED ORDER — ACCU-CHEK AVIVA PLUS VI STRP: ORAL_STRIP | 12 refills | Status: DC

## 2021-12-31 DIAGNOSIS — Z419 Encounter for procedure for purposes other than remedying health state, unspecified: Secondary | ICD-10-CM | POA: Diagnosis not present

## 2022-01-03 ENCOUNTER — Telehealth: Payer: Self-pay

## 2022-01-03 NOTE — Telephone Encounter (Signed)
Pa for pt ( VARENICLINE  TARTRATE ) came through on cover my meds was submitted awaiting approval or denial .. ?

## 2022-01-04 NOTE — Telephone Encounter (Signed)
DECISION : ? ? ?Approvedon April 4 ?Approved. This drug is covered on the Preferred Drug List. It does not require prior approval. You filled this medication on 12/05/2021 for a quantity of 30 for a 30 day supply at [facility]. Your next refill is on 03/30/2022. ? ? ? ? ? ? ( COPY SENT TO PHARMACY ALSO )  ?

## 2022-01-11 ENCOUNTER — Other Ambulatory Visit: Payer: Self-pay | Admitting: Internal Medicine

## 2022-01-11 ENCOUNTER — Other Ambulatory Visit: Payer: Self-pay | Admitting: Podiatry

## 2022-01-11 DIAGNOSIS — F1090 Alcohol use, unspecified, uncomplicated: Secondary | ICD-10-CM

## 2022-01-11 DIAGNOSIS — Z789 Other specified health status: Secondary | ICD-10-CM

## 2022-01-11 DIAGNOSIS — J449 Chronic obstructive pulmonary disease, unspecified: Secondary | ICD-10-CM

## 2022-01-28 NOTE — Progress Notes (Signed)
? ?Office Visit ? ? Patient ID: Lendon KaDennis Iseminger, male    DOB: [birth year], 56 y.o.   MRN: [id number]   PCP: Elige Radonhristian, Deleon Passe, MD  ? ?Subjective:  ?Lendon KaDennis Germano is a 56 y.o. year old male who presents for diabetes, hypertension, hyperlipidemia, tobacco use disorder, alcohol use disorder follow up.  ? ?LOV with me was in January ?-We discussed his iron deficiency and I recommended he start an iron supplement ?-Inhaler education ?-Increased chantix ?-A1C increased 6.5>>7.7. Invokana added  ?-reported 4w of alcohol cessation. Naltrexone continued ? ?Interm history ?11/10/21: Presented to the ED after falling off the porch. ETOH level 213; negative CTH and C-spine imaging. Discharged home ? ? ? ?ACTIVE MEDICATIONS  ? ?Outpatient Medications Prior to Visit  ?Medication Sig  ? atorvastatin (LIPITOR) 80 MG tablet Take 1 tablet (80 mg total) by mouth daily.  ? ATROVENT HFA 17 MCG/ACT inhaler Inhale 2 puffs into the lungs 4 (four) times daily.  ? canagliflozin (INVOKANA) 300 MG TABS tablet Take 1 tablet (300 mg total) by mouth daily before breakfast.  ? diclofenac Sodium (VOLTAREN) 1 % GEL Apply 2 g topically 2 (two) times daily as needed.  ? enalapril (VASOTEC) 10 MG tablet Take 1 tablet (10 mg total) by mouth daily.  ? glucose blood (ACCU-CHEK AVIVA PLUS) test strip Use as instructed  ? GNP NICOTINE 14 MG/24HR patch APPLY ONE PATCH ONTO THE SKIN EVERY DAY  ? Lancets (FREESTYLE) lancets Use as instructed  ? metFORMIN (GLUCOPHAGE) 1000 MG tablet Take 1 tablet (1,000 mg total) by mouth 2 (two) times daily with a meal.  ? nitroGLYCERIN (NITROSTAT) 0.4 MG SL tablet Place 1 tablet (0.4 mg total) under the tongue every 5 (five) minutes as needed for chest pain. If chest pain does not resolve after 2 tablets, please obtain evaluation in the ER.  ? sildenafil (VIAGRA) 50 MG tablet Take 1 tablet (50 mg total) by mouth as needed for erectile dysfunction.  ? [DISCONTINUED] famotidine (PEPCID) 20 MG tablet Take 1 tablet (20 mg total) by  mouth 2 (two) times daily.  ? [DISCONTINUED] naltrexone (DEPADE) 50 MG tablet TAKE ONE TABLET BY MOUTH DAILY  ? [DISCONTINUED] pantoprazole (PROTONIX) 40 MG tablet TAKE ONE TABLET BY MOUTH DAILY  ? [DISCONTINUED] STIOLTO RESPIMAT 2.5-2.5 MCG/ACT AERS Inhale 2 puffs into the lungs daily.  ? [DISCONTINUED] varenicline (CHANTIX) 1 MG tablet Take 1 tablet (1 mg total) by mouth 2 (two) times daily.  ? ?No facility-administered medications prior to visit.  ?  ? ?Objective:  ? ?BP (!) 148/95 (BP Location: Left Arm, Cuff Size: Normal)   Pulse 89   Temp 98 ?F (36.7 ?C) (Oral)   Resp (!) 28   Ht 6\' 1"  (1.854 m)   Wt 194 lb 9.6 oz (88.3 kg)   SpO2 100% Comment: room air  BMI 25.67 kg/m?  ?Wt Readings from Last 3 Encounters:  ?02/02/22 194 lb 9.6 oz (88.3 kg)  ?11/10/21 200 lb (90.7 kg)  ?10/20/21 204 lb 11.2 oz (92.9 kg)  ?  ?BP Readings from Last 3 Encounters:  ?02/02/22 (!) 148/95  ?11/11/21 (!) 146/82  ?10/20/21 139/83  ? ?General: well appearing, no distress ?Cardiac: RRR ?Pulm: diminished lung sounds throughout, no wheezes ? ?Assessment & Plan:  ? ?Problem List Items Addressed This Visit   ? ?  ? Cardiovascular and Mediastinum  ? Hypertension associated with diabetes (HCC) (Chronic)  ?  Current medications: enalapril 10mg  daily ?Endorses medication compliance, congruent with dispense report. Denies adverse medication reactions. ?  Blood pressure is above goal in the office today--146/82. ?Add amlodipine 10 mg daily.  Continue enalapril 10 mg daily  ?Follow-up in 2 weeks for blood pressure recheck ? ?  ?  ? Relevant Medications  ? amLODipine (NORVASC) 10 MG tablet  ?  ? Respiratory  ? COPD (chronic obstructive pulmonary disease) (HCC) (Chronic)  ?  COPD follow up: ?Current medications: stiolto, atrovent ?Med Adherence:  [x]  Yes    []  No ?Symptom control: [x]  Yes    []  No  CAT score: Not obtained today due to time limitations # of exacerbations in past 56mo: 0 ?Smoking history: [x]  Yes    []  No     Currently smoking:  [x]  Yes    []  No 2 to 3 cigarettes daily, down from 0.5 packs at his last office visit ?Lung cancer screening in the past 12 months:  []  Yes    [x]  No  -ordered but not performed ?Plan:  ?Continue current management ?Encouraged complete smoking cessation ?We have discussed LDCT and PFTs numerous times however he has continued to not follow up on these. He understands the reasoning of these. I will defer re-ordering these today. ?Follow-up in 3 months ?  ?  ? Relevant Medications  ? varenicline (CHANTIX) 1 MG tablet  ? Tiotropium Bromide-Olodaterol (STIOLTO RESPIMAT) 2.5-2.5 MCG/ACT AERS  ?  ? Digestive  ? GERD (gastroesophageal reflux disease) (Chronic)  ?  He has been experiencing persistent symptoms. ?- Transition to Protonix ?- Check fecal H. pylori antigen ?- Referred to GI ? ?  ?  ? Relevant Medications  ? pantoprazole (PROTONIX) 40 MG tablet  ? Other Relevant Orders  ? Ambulatory referral to Gastroenterology  ? H. pylori antigen, stool  ?  ? Endocrine  ? Type 2 diabetes mellitus (HCC) - Primary (Chronic)  ?  Current medications: metformin 1000mg  BID, Invokana 300 mg daily ?A1c has shown some minimal improvement since the addition of Invokana, down from 7.7-7.5 today.  He was hypoglycemic in the office today--68.  He was symptomatic with feeling "whoozy".  Denies frequent hypoglycemia at home ?Last foot exam 04/2021 ?Retinopathy screening due. Referral place 06/2021--he has not followed up with them in the interm. Encouraged he do this again today.  ?Continue current management ?Follow-up in 3 months ?  ?  ? Relevant Orders  ? POC Hbg A1C (Completed)  ? Ambulatory referral to Ophthalmology  ?  ? Other  ? Tobacco abuse (Chronic)  ?  He reports that he is down to smoking 2 or 3 cigarettes daily.  He is continuing to use Chantix 1 mg twice daily.  7 minutes of today's visit was used on smoking cessation counseling. ? ?  ?  ? Alcohol use disorder, mild, in early remission (Chronic)  ?  He was seen in the ED in  February after falling off a patio while being intoxicated.  We discussed this today.  He tells me that he is drinking 2-3 beers a couple times a week.  He had previously used naltrexone however he says that he no longer wants to take this due to pill burden.  We had a lengthy discussion about the importance of abstaining from alcohol and the role that naltrexone can play however he remains adamant that he does not want to take it anymore. ?- We will continue to follow his alcohol use at follow-up appointments and can consider referral to AA in the future ? ?  ?  ? ?Follow-up in 2 weeks for blood  pressure recheck ? ?Pt discussed with Dr. Sol Blazing ? ?Elige Radon, MD ?Internal Medicine Resident PGY-2 ?[facility] ?02/05/2022 10:23 AM  ?  ? ?

## 2022-01-30 DIAGNOSIS — Z419 Encounter for procedure for purposes other than remedying health state, unspecified: Secondary | ICD-10-CM | POA: Diagnosis not present

## 2022-02-02 ENCOUNTER — Other Ambulatory Visit: Payer: Self-pay

## 2022-02-02 ENCOUNTER — Encounter: Payer: Self-pay | Admitting: Internal Medicine

## 2022-02-02 ENCOUNTER — Ambulatory Visit (INDEPENDENT_AMBULATORY_CARE_PROVIDER_SITE_OTHER): Payer: Medicaid Other | Admitting: Internal Medicine

## 2022-02-02 VITALS — BP 148/95 | HR 89 | Temp 98.0°F | Resp 28 | Ht 73.0 in | Wt 194.6 lb

## 2022-02-02 DIAGNOSIS — E611 Iron deficiency: Secondary | ICD-10-CM

## 2022-02-02 DIAGNOSIS — F17210 Nicotine dependence, cigarettes, uncomplicated: Secondary | ICD-10-CM

## 2022-02-02 DIAGNOSIS — I152 Hypertension secondary to endocrine disorders: Secondary | ICD-10-CM | POA: Diagnosis not present

## 2022-02-02 DIAGNOSIS — K219 Gastro-esophageal reflux disease without esophagitis: Secondary | ICD-10-CM

## 2022-02-02 DIAGNOSIS — J449 Chronic obstructive pulmonary disease, unspecified: Secondary | ICD-10-CM

## 2022-02-02 DIAGNOSIS — Z7984 Long term (current) use of oral hypoglycemic drugs: Secondary | ICD-10-CM

## 2022-02-02 DIAGNOSIS — E119 Type 2 diabetes mellitus without complications: Secondary | ICD-10-CM

## 2022-02-02 DIAGNOSIS — E1159 Type 2 diabetes mellitus with other circulatory complications: Secondary | ICD-10-CM

## 2022-02-02 DIAGNOSIS — F1011 Alcohol abuse, in remission: Secondary | ICD-10-CM | POA: Diagnosis not present

## 2022-02-02 DIAGNOSIS — Z720 Tobacco use: Secondary | ICD-10-CM

## 2022-02-02 LAB — POCT GLYCOSYLATED HEMOGLOBIN (HGB A1C): Hemoglobin A1C: 7.3 % — AB (ref 4.0–5.6)

## 2022-02-02 LAB — GLUCOSE, CAPILLARY: Glucose-Capillary: 68 mg/dL — ABNORMAL LOW (ref 70–99)

## 2022-02-02 MED ORDER — STIOLTO RESPIMAT 2.5-2.5 MCG/ACT IN AERS: 2.0000 | INHALATION_SPRAY | Freq: Every day | RESPIRATORY_TRACT | 1 refills | Status: DC

## 2022-02-02 MED ORDER — PANTOPRAZOLE SODIUM 40 MG PO TBEC: 40.0000 mg | DELAYED_RELEASE_TABLET | Freq: Every day | ORAL | 2 refills | Status: DC

## 2022-02-02 MED ORDER — AMLODIPINE BESYLATE 10 MG PO TABS: 10.0000 mg | ORAL_TABLET | Freq: Every day | ORAL | 1 refills | Status: DC

## 2022-02-02 MED ORDER — AMITRIPTYLINE HCL 100 MG PO TABS: 100.0000 mg | ORAL_TABLET | Freq: Every day | ORAL | 1 refills | Status: DC

## 2022-02-02 MED ORDER — VARENICLINE TARTRATE 1 MG PO TABS: 1.0000 mg | ORAL_TABLET | Freq: Two times a day (BID) | ORAL | 0 refills | Status: DC

## 2022-02-02 NOTE — Progress Notes (Signed)
Hypoglycemic Event ? ?CBG: 68 ? ?Treatment: 8 oz juice/soda pt also given graham crackers and peaches as he had not eaten all day.  ? ?Symptoms: None ? ?Possible Reasons for Event: Inadequate meal intake ? ?Comments/MD notified:Yes ? ? ? ?Ruben Pierce ? ? ?

## 2022-02-04 NOTE — Assessment & Plan Note (Signed)
Current medications: enalapril 10mg  daily ?Endorses medication compliance, congruent with dispense report. Denies adverse medication reactions. ?Blood pressure is above goal in the office today--146/82. ?? Add amlodipine 10 mg daily.  Continue enalapril 10 mg daily  ?? Follow-up in 2 weeks for blood pressure recheck ? ?

## 2022-02-04 NOTE — Assessment & Plan Note (Addendum)
Current medications: metformin 1000mg  BID, Invokana 300 mg daily ?A1c has shown some minimal improvement since the addition of Invokana, down from 7.7-7.5 today.  He was hypoglycemic in the office today--68.  He was symptomatic with feeling "whoozy".  Denies frequent hypoglycemia at home ?Last foot exam 04/2021 ?Retinopathy screening due. Referral place 06/2021--he has not followed up with them in the interm. Encouraged he do this again today.  ?? Continue current management ?? Follow-up in 3 months ?

## 2022-02-05 ENCOUNTER — Encounter: Payer: Self-pay | Admitting: Internal Medicine

## 2022-02-05 NOTE — Assessment & Plan Note (Signed)
He has been experiencing persistent symptoms. ?- Transition to Protonix ?- Check fecal H. pylori antigen ?- Referred to GI ?

## 2022-02-05 NOTE — Assessment & Plan Note (Signed)
He was seen in the ED in February after falling off a patio while being intoxicated.  We discussed this today.  He tells me that he is drinking 2-3 beers a couple times a week.  He had previously used naltrexone however he says that he no longer wants to take this due to pill burden.  We had a lengthy discussion about the importance of abstaining from alcohol and the role that naltrexone can play however he remains adamant that he does not want to take it anymore. ?- We will continue to follow his alcohol use at follow-up appointments and can consider referral to AA in the future ?

## 2022-02-05 NOTE — Assessment & Plan Note (Signed)
He reports that he is down to smoking 2 or 3 cigarettes daily.  He is continuing to use Chantix 1 mg twice daily.  7 minutes of today's visit was used on smoking cessation counseling. ?

## 2022-02-05 NOTE — Assessment & Plan Note (Signed)
COPD follow up: ?Current medications: stiolto, atrovent ?Med Adherence:  [x]  Yes    []  No ?Symptom control: [x]  Yes    []  No  CAT score: Not obtained today due to time limitations # of exacerbations in past 32mo: 0 ?Smoking history: [x]  Yes    []  No     Currently smoking: [x]  Yes    []  No 2 to 3 cigarettes daily, down from 0.5 packs at his last office visit ?Lung cancer screening in the past 12 months:  []  Yes    [x]  No  -ordered but not performed ?Plan:  ?? Continue current management ?? Encouraged complete smoking cessation ?? We have discussed LDCT and PFTs numerous times however he has continued to not follow up on these. He understands the reasoning of these. I will defer re-ordering these today. ?? Follow-up in 3 months ?

## 2022-02-08 NOTE — Progress Notes (Signed)
Internal Medicine Clinic Attending  Case discussed with Dr. Christian  At the time of the visit.  We reviewed the resident's history and exam and pertinent patient test results.  I agree with the assessment, diagnosis, and plan of care documented in the resident's note.  

## 2022-02-14 ENCOUNTER — Encounter: Payer: Self-pay | Admitting: Dietician

## 2022-02-16 ENCOUNTER — Encounter: Payer: Self-pay | Admitting: Internal Medicine

## 2022-02-16 ENCOUNTER — Ambulatory Visit (INDEPENDENT_AMBULATORY_CARE_PROVIDER_SITE_OTHER): Payer: Medicaid Other | Admitting: Internal Medicine

## 2022-02-16 DIAGNOSIS — F17210 Nicotine dependence, cigarettes, uncomplicated: Secondary | ICD-10-CM | POA: Diagnosis not present

## 2022-02-16 DIAGNOSIS — K219 Gastro-esophageal reflux disease without esophagitis: Secondary | ICD-10-CM

## 2022-02-16 DIAGNOSIS — I152 Hypertension secondary to endocrine disorders: Secondary | ICD-10-CM | POA: Diagnosis not present

## 2022-02-16 DIAGNOSIS — E1159 Type 2 diabetes mellitus with other circulatory complications: Secondary | ICD-10-CM

## 2022-02-16 NOTE — Progress Notes (Signed)
   Office Visit   Patient ID: Ruben Pierce, male    DOB: 07-23-1966, 56 y.o.   MRN: [id number]   PCP: Elige Radon, MD   Subjective:  CC: Hypertension (Right/)   Ruben Pierce is a 56 y.o. year old male who presents for the above medical condition(s). Please refer to problem based charting for assessment and plan.  Objective:   BP 129/75 (Patient Position: Sitting, Cuff Size: Normal)   Pulse 94   Temp 98.2 F (36.8 C) (Oral)   Wt 197 lb 3.2 oz (89.4 kg)   SpO2 99%   BMI 26.02 kg/m   General: Chronically ill male in no distress Cardiac: Heart regular rate and rhythm Pulm: Breathing comfortably on room air, lung sounds are clear Assessment & Plan:   Problem List Items Addressed This Visit       Cardiovascular and Mediastinum   Hypertension associated with diabetes (HCC) (Chronic)    Current medications: Enalapril 10 mg daily, amlodipine 10 mg daily (started at last office visit) He presents today for 2-week blood pressure follow-up visit.  Reports adherence to current medications.  Denies adverse medication effects. Blood pressure is at goal in the office today- 129/75 Plan - Continue current management - Follow-up 3 months         Digestive   GERD (gastroesophageal reflux disease) (Chronic)    H. pylori fecal antigen test return to the office today.  Will follow results       Follow-up with new PCP and [facility] in 3 months -Routine 90-month diabetes check with A1c 8 - Ensure retinopathy screening has been performed - Reevaluate blood pressure  Follow-up with GI for evaluation of persistent GERD   Pt discussed with Dr. Leo Rod, MD Internal Medicine Resident PGY-3 [facility] 02/16/2022 12:25 PM

## 2022-02-16 NOTE — Assessment & Plan Note (Signed)
H. pylori fecal antigen test return to the office today.  Will follow results

## 2022-02-16 NOTE — Assessment & Plan Note (Signed)
Current medications: Enalapril 10 mg daily, amlodipine 10 mg daily (started at last office visit) He presents today for 2-week blood pressure follow-up visit.  Reports adherence to current medications.  Denies adverse medication effects. Blood pressure is at goal in the office today- 129/75 Plan - Continue current management - Follow-up 3 months

## 2022-02-16 NOTE — Addendum Note (Signed)
Addended by: Bufford Spikes on: 02/16/2022 10:38 AM   Modules accepted: Orders

## 2022-02-18 LAB — H. PYLORI ANTIGEN, STOOL: H pylori Ag, Stl: NEGATIVE

## 2022-02-20 ENCOUNTER — Encounter: Payer: Self-pay | Admitting: Internal Medicine

## 2022-02-21 NOTE — Progress Notes (Signed)
Internal Medicine Clinic Attending  Case discussed with Dr. Christian  At the time of the visit.  We reviewed the resident's history and exam and pertinent patient test results.  I agree with the assessment, diagnosis, and plan of care documented in the resident's note.  

## 2022-03-02 DIAGNOSIS — Z419 Encounter for procedure for purposes other than remedying health state, unspecified: Secondary | ICD-10-CM | POA: Diagnosis not present

## 2022-03-13 ENCOUNTER — Other Ambulatory Visit: Payer: Self-pay | Admitting: Podiatry

## 2022-03-13 ENCOUNTER — Other Ambulatory Visit: Payer: Self-pay | Admitting: Internal Medicine

## 2022-03-13 DIAGNOSIS — J449 Chronic obstructive pulmonary disease, unspecified: Secondary | ICD-10-CM

## 2022-03-14 ENCOUNTER — Other Ambulatory Visit: Payer: Self-pay | Admitting: Internal Medicine

## 2022-03-14 DIAGNOSIS — Z789 Other specified health status: Secondary | ICD-10-CM

## 2022-03-20 ENCOUNTER — Other Ambulatory Visit: Payer: Self-pay | Admitting: Internal Medicine

## 2022-03-20 DIAGNOSIS — E119 Type 2 diabetes mellitus without complications: Secondary | ICD-10-CM

## 2022-03-27 ENCOUNTER — Other Ambulatory Visit: Payer: Self-pay | Admitting: Internal Medicine

## 2022-03-27 ENCOUNTER — Encounter: Payer: Self-pay | Admitting: *Deleted

## 2022-03-27 ENCOUNTER — Other Ambulatory Visit: Payer: Self-pay | Admitting: Podiatry

## 2022-03-27 DIAGNOSIS — Z789 Other specified health status: Secondary | ICD-10-CM

## 2022-03-28 ENCOUNTER — Other Ambulatory Visit: Payer: Self-pay | Admitting: Internal Medicine

## 2022-04-01 DIAGNOSIS — Z419 Encounter for procedure for purposes other than remedying health state, unspecified: Secondary | ICD-10-CM | POA: Diagnosis not present

## 2022-04-12 ENCOUNTER — Other Ambulatory Visit: Payer: Self-pay | Admitting: Internal Medicine

## 2022-04-12 ENCOUNTER — Other Ambulatory Visit: Payer: Self-pay | Admitting: Podiatry

## 2022-04-12 DIAGNOSIS — Z789 Other specified health status: Secondary | ICD-10-CM

## 2022-04-17 ENCOUNTER — Encounter: Payer: Self-pay | Admitting: Internal Medicine

## 2022-04-18 ENCOUNTER — Ambulatory Visit (INDEPENDENT_AMBULATORY_CARE_PROVIDER_SITE_OTHER): Payer: Medicaid Other | Admitting: Podiatry

## 2022-04-18 DIAGNOSIS — M79675 Pain in left toe(s): Secondary | ICD-10-CM

## 2022-04-18 DIAGNOSIS — B351 Tinea unguium: Secondary | ICD-10-CM

## 2022-04-18 DIAGNOSIS — G629 Polyneuropathy, unspecified: Secondary | ICD-10-CM

## 2022-04-18 DIAGNOSIS — I739 Peripheral vascular disease, unspecified: Secondary | ICD-10-CM

## 2022-04-18 DIAGNOSIS — M79674 Pain in right toe(s): Secondary | ICD-10-CM

## 2022-04-18 DIAGNOSIS — E1142 Type 2 diabetes mellitus with diabetic polyneuropathy: Secondary | ICD-10-CM

## 2022-04-18 NOTE — Progress Notes (Signed)
This patient returns to my office for at risk foot care.  This patient requires this care by a professional since this patient will be at risk due to having type 2 diabetes.  This patient is unable to cut nails himself since the patient cannot reach his nails.These nails are painful walking and wearing shoes.  This patient presents for at risk foot care today.  General Appearance  Alert, conversant and in no acute stress.  Vascular  Dorsalis pedis and posterior tibial  pulses are palpable  bilaterally.  Capillary return is within normal limits  bilaterally. Temperature is within normal limits  bilaterally.  Neurologic  Senn-Weinstein monofilament wire test within normal limits  bilaterally. Muscle power within normal limits bilaterally.  Nails Thick disfigured discolored nails with subungual debris  from hallux to fifth toes bilaterally. No evidence of bacterial infection or drainage bilaterally. Right hallux toenail has self avulsed.  Orthopedic  No limitations of motion  feet .  No crepitus or effusions noted.  No bony pathology or digital deformities noted.   HAV  B/L.  Skin  normotropic skin with no porokeratosis noted bilaterally.  No signs of infections or ulcers noted.     Onychomycosis  Pain in right toes  Pain in left toes   Consent was obtained for treatment procedures.   Mechanical debridement of nails 1-5  bilaterally performed with a nail nipper.  Filed with dremel without incident.   Return office visit   6 months                 Told patient to return for periodic foot care and evaluation due to potential at risk complications.   Helane Gunther DPM

## 2022-04-20 ENCOUNTER — Ambulatory Visit (INDEPENDENT_AMBULATORY_CARE_PROVIDER_SITE_OTHER): Payer: Medicaid Other | Admitting: Student

## 2022-04-20 VITALS — BP 115/57 | HR 99 | Temp 97.8°F | Ht 73.0 in | Wt 192.4 lb

## 2022-04-20 DIAGNOSIS — F17210 Nicotine dependence, cigarettes, uncomplicated: Secondary | ICD-10-CM | POA: Diagnosis not present

## 2022-04-20 DIAGNOSIS — E1159 Type 2 diabetes mellitus with other circulatory complications: Secondary | ICD-10-CM | POA: Diagnosis not present

## 2022-04-20 DIAGNOSIS — G4700 Insomnia, unspecified: Secondary | ICD-10-CM | POA: Diagnosis not present

## 2022-04-20 DIAGNOSIS — Z7984 Long term (current) use of oral hypoglycemic drugs: Secondary | ICD-10-CM

## 2022-04-20 DIAGNOSIS — K219 Gastro-esophageal reflux disease without esophagitis: Secondary | ICD-10-CM

## 2022-04-20 DIAGNOSIS — M79604 Pain in right leg: Secondary | ICD-10-CM | POA: Diagnosis not present

## 2022-04-20 DIAGNOSIS — M79606 Pain in leg, unspecified: Secondary | ICD-10-CM | POA: Insufficient documentation

## 2022-04-20 DIAGNOSIS — I152 Hypertension secondary to endocrine disorders: Secondary | ICD-10-CM | POA: Diagnosis not present

## 2022-04-20 DIAGNOSIS — E119 Type 2 diabetes mellitus without complications: Secondary | ICD-10-CM | POA: Diagnosis not present

## 2022-04-20 DIAGNOSIS — J449 Chronic obstructive pulmonary disease, unspecified: Secondary | ICD-10-CM

## 2022-04-20 MED ORDER — AMLODIPINE BESYLATE 10 MG PO TABS: 10.0000 mg | ORAL_TABLET | Freq: Every day | ORAL | 11 refills | Status: DC

## 2022-04-20 MED ORDER — METFORMIN HCL 1000 MG PO TABS: 1000.0000 mg | ORAL_TABLET | Freq: Two times a day (BID) | ORAL | 2 refills | Status: DC

## 2022-04-20 MED ORDER — DICLOFENAC SODIUM 1 % EX GEL
2.0000 g | Freq: Four times a day (QID) | CUTANEOUS | 1 refills | Status: DC
Start: 2022-04-20 — End: 2022-05-05

## 2022-04-20 MED ORDER — STIOLTO RESPIMAT 2.5-2.5 MCG/ACT IN AERS: 2.0000 | INHALATION_SPRAY | Freq: Every day | RESPIRATORY_TRACT | 1 refills | Status: DC

## 2022-04-20 MED ORDER — PANTOPRAZOLE SODIUM 40 MG PO TBEC: 40.0000 mg | DELAYED_RELEASE_TABLET | Freq: Every day | ORAL | 11 refills | Status: DC

## 2022-04-20 NOTE — Assessment & Plan Note (Signed)
Current medications: Enalapril 10 mg daily, amlodipine 10 mg daily. Patient presents with blood pressure of 115/57.  Patient denies any headaches or vision changes.  Blood pressure at goal of less than 130/80.  Plan: - Continue enalapril 10 mg daily - Continue amlodipine 10 mg daily - Follow-up in 3 months for hypertension

## 2022-04-20 NOTE — Assessment & Plan Note (Addendum)
Patient presents for a 2-3 month history of leg pain.  Patient describes this as a burning sensation that started at his right knee and goes down to his right ankle.  He states that it has gradually come on.  He states weightbearing makes it worse.  He denies anything that makes it better.  He denies taking anything for this pain.  On exam, he has a limp with walking.  Patient has no tenderness to the right leg.  He does report pain with passive range of motion.  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  This is likely musculoskeletal related.  This can either be patellofemoral syndrome versus iliotibial band syndrome.  The treatment will be conservative.  Plan: - Apply Voltaren gel to area of pain - Refer patient to physical therapy - Inform patient that stretching and minor exercises can help as well

## 2022-04-20 NOTE — Patient Instructions (Addendum)
Nickolai, Rinks you for allowing me to take part in your care today.  Here are your instructions.  1.  Regarding your leg pain, I want to see you back in 2 to 3 weeks.  I have prescribed you Voltaren gel.  I want you to use this as needed for the pain.  Please apply this to your area of pain.  I have also referred you to physical therapy, please await their phone call.  2.  I have obtained blood work from you for your A1c.  Please await phone call for results.  3.  I have sent in your Stiolto, metformin, amlodipine, pantoprazole,and Voltaren gel.  Please pick this up from your pharmacy and take as directed.  Thank you, Dr. Allena Katz  If you have any other questions please contact the internal medicine clinic at [phone]

## 2022-04-20 NOTE — Assessment & Plan Note (Signed)
Patient reports no symptoms of GERD today.  He requests his Protonix be refilled.  He states that this helps him very much.  Plan: - Continue Protonix 40 mg daily.

## 2022-04-20 NOTE — Progress Notes (Signed)
CC: Right leg pain  HPI:  [name] is a 56 y.o. male with a past medical history of hypertension, diabetes, and GERD who presents to the clinic for concerns of right leg pain.  He also requests medication refills at this time.  He states that he is low on his amlodipine, Stiolto, pantoprazole, and metformin.  Patient reports she has been having right leg pain for the past 2 to 3 months.  He describes this as a burning pain to his right knee that radiates down to his foot.  He states that he has not tried anything for this pain.  He reports that the pain becomes worse with weightbearing and squatting.  He currently works at a [employer]/land [employer] and states that his pain gets worse at work.  His pain is relieved by rest.  Patient informs me that his left leg is completely fine.  He denies any back pain, hip pain, or foot pain.  He only is concerned about his right knee down to his ankle.  See assessment and plan for full HPI.   Past Medical History:  Diagnosis Date   Asthma    Counseling regarding advanced directives and goals of care 12/01/2020   DNR/DNI MOST form filled out 12/01/20   ETOH abuse    Hypertension    TBI (traumatic brain injury) (HCC)    Type 2 diabetes mellitus, uncontrolled 2010     Current Outpatient Medications:    diclofenac Sodium (VOLTAREN) 1 % GEL, Apply 2 g topically 4 (four) times daily., Disp: 50 g, Rfl: 1   amitriptyline (ELAVIL) 100 MG tablet, Take 1 tablet (100 mg total) by mouth at bedtime., Disp: 90 tablet, Rfl: 1   amLODipine (NORVASC) 10 MG tablet, Take 1 tablet (10 mg total) by mouth daily., Disp: 30 tablet, Rfl: 11   atorvastatin (LIPITOR) 80 MG tablet, Take 1 tablet (80 mg total) by mouth daily., Disp: 90 tablet, Rfl: 2   enalapril (VASOTEC) 10 MG tablet, Take 1 tablet (10 mg total) by mouth daily., Disp: 90 tablet, Rfl: 3   glucose blood (ACCU-CHEK AVIVA PLUS) test strip, Use as instructed, Disp: 100 each, Rfl: 12   GNP NICOTINE 14 MG/24HR patch,  APPLY ONE PATCH ONTO THE SKIN EVERY DAY, Disp: 28 patch, Rfl: 3   INVOKANA 300 MG TABS tablet, Take 1 tablet (300 mg total) by mouth daily before breakfast., Disp: 90 tablet, Rfl: 1   Lancets (FREESTYLE) lancets, Use as instructed, Disp: 100 each, Rfl: 12   metFORMIN (GLUCOPHAGE) 1000 MG tablet, Take 1 tablet (1,000 mg total) by mouth 2 (two) times daily with a meal., Disp: 360 tablet, Rfl: 2   nitroGLYCERIN (NITROSTAT) 0.4 MG SL tablet, Place 1 tablet (0.4 mg total) under the tongue every 5 (five) minutes as needed for chest pain. If chest pain does not resolve after 2 tablets, please obtain evaluation in the ER., Disp: 100 tablet, Rfl: 0   pantoprazole (PROTONIX) 40 MG tablet, Take 1 tablet (40 mg total) by mouth daily., Disp: 30 tablet, Rfl: 11   sildenafil (VIAGRA) 50 MG tablet, Take 1 tablet (50 mg total) by mouth as needed for erectile dysfunction., Disp: 20 tablet, Rfl: 1   Tiotropium Bromide-Olodaterol (STIOLTO RESPIMAT) 2.5-2.5 MCG/ACT AERS, Inhale 2 puffs into the lungs daily., Disp: 4 g, Rfl: 1   varenicline (CHANTIX) 1 MG tablet, Take 1 tablet (1 mg total) by mouth 2 (two) times daily., Disp: 180 tablet, Rfl: 0  Review of Systems:  MSK: Patient  reports right leg pain  Physical Exam:  Vitals:   04/20/22 0942  BP: (!) 115/57  Pulse: 99  Temp: 97.8 F (36.6 C)  TempSrc: Oral  SpO2: 97%  Weight: 192 lb 6.4 oz (87.3 kg)  Height: 6\' 1"  (1.854 m)    General: Alert and orientated x3. Patient is sitting comfortably in the room  Eyes: EOM intact  Head: Normocephalic, atraumatic  Cardio: Regular rate and rhythm, no murmurs, rubs or gallops. 2+ pulses to bilateral upper and lower extremities  Pulmonary: There is bilateral wheezing Back: No midline tenderness, no step off or deformities noted. No paraspinal muscle tenderness.  MSK: 5/5 strength to upper and lower extremities.  Patient has full active and passive range of motion of his right lower extremity on hip extension, hip  flexion, knee extension, and knee flexion.  Passive range of motion does cause him pain.  There is no tenderness to his bilateral lower extremities.  2+ pulses to bilateral lower extremities.  Patellar grind test positive on the right.  Patellar grind test negative on left.   Assessment & Plan:   Hypertension associated with diabetes (HCC) Current medications: Enalapril 10 mg daily, amlodipine 10 mg daily. Patient presents with blood pressure of 115/57.  Patient denies any headaches or vision changes.  Blood pressure at goal of less than 130/80.  Plan: - Continue enalapril 10 mg daily - Continue amlodipine 10 mg daily - Follow-up in 3 months for hypertension  COPD (chronic obstructive pulmonary disease) (HCC) Patient currently on Stiolto.  There was some confusion on if the patient was on Atrovent versus Stiolto.  He states that he takes Stiolto, which I will refill for him today.  Patient is encouraged to go to a pulmonologist for PFTs.  Patient has wheezing noted to bilateral lungs today.  Plan: - Continue Stiolto 2 puffs daily - Have patient reach out to pulmonology for PFTs  GERD (gastroesophageal reflux disease) Patient reports no symptoms of GERD today.  He requests his Protonix be refilled.  He states that this helps him very much.  Plan: - Continue Protonix 40 mg daily.  Type 2 diabetes mellitus (HCC) Patient is currently on Invokana 300 mg and metformin 1000 mg twice daily.  He reports compliance with his medications.  Patient denies any issues with this.  He denies any side effects.  Patient's last A1c was on 05/04 which was 7.3.  He is due for another A1c in 2 weeks.  Since he is here, I will obtain now.  Plan: - Obtain A1c today - Continue Invokana 300 mg daily -Continue metformin 1000 mg twice daily  Leg pain Patient presents for a 2-3 month history of leg pain.  Patient describes this as a burning sensation that started at his right knee and goes down to his right  ankle.  He states that it has gradually come on.  He states weightbearing makes it worse.  He denies anything that makes it better.  He denies taking anything for this pain.  On exam, he has a limp with walking.  Patient has no tenderness to the right leg.  He does report pain with passive range of motion.  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  This is likely musculoskeletal related.  This can either be patellofemoral syndrome versus iliotibial band syndrome.  The treatment will be conservative.  Plan: - Apply Voltaren gel to area of pain - Refer  patient to physical therapy - Inform patient that stretching and minor exercises can help as well     Patient seen with Dr. Princella Pellegrini, DO PGY-1 Internal Medicine Resident  Pager: [phone]

## 2022-04-20 NOTE — Assessment & Plan Note (Signed)
Patient currently on Stiolto.  There was some confusion on if the patient was on Atrovent versus Stiolto.  He states that he takes Stiolto, which I will refill for him today.  Patient is encouraged to go to a pulmonologist for PFTs.  Patient has wheezing noted to bilateral lungs today.  Plan: - Continue Stiolto 2 puffs daily - Have patient reach out to pulmonology for PFTs

## 2022-04-20 NOTE — Assessment & Plan Note (Signed)
Patient is currently on Invokana 300 mg and metformin 1000 mg twice daily.  He reports compliance with his medications.  Patient denies any issues with this.  He denies any side effects.  Patient's last A1c was on 05/04 which was 7.3.  He is due for another A1c in 2 weeks.  Since he is here, I will obtain now.  Plan: - Obtain A1c today - Continue Invokana 300 mg daily -Continue metformin 1000 mg twice daily

## 2022-04-21 ENCOUNTER — Encounter: Payer: Self-pay | Admitting: Student

## 2022-04-21 LAB — HEMOGLOBIN A1C
Est. average glucose Bld gHb Est-mCnc: 154 mg/dL
Hgb A1c MFr Bld: 7 % — ABNORMAL HIGH (ref 4.8–5.6)

## 2022-04-25 ENCOUNTER — Other Ambulatory Visit: Payer: Self-pay | Admitting: Internal Medicine

## 2022-05-01 ENCOUNTER — Other Ambulatory Visit: Payer: Self-pay | Admitting: Internal Medicine

## 2022-05-01 NOTE — Progress Notes (Signed)
Internal Medicine Clinic Attending  I saw and evaluated the patient.  I personally confirmed the key portions of the history and exam documented by Dr. Patel and I reviewed pertinent patient test results.  The assessment, diagnosis, and plan were formulated together and I agree with the documentation in the resident's note.  

## 2022-05-02 DIAGNOSIS — Z419 Encounter for procedure for purposes other than remedying health state, unspecified: Secondary | ICD-10-CM | POA: Diagnosis not present

## 2022-05-04 ENCOUNTER — Telehealth: Payer: Self-pay

## 2022-05-04 NOTE — Telephone Encounter (Signed)
Pa  for pt ( VARENICLINE  RENEWAL ) came through on cover my meds.. was submitted with last office notes awaiting approval or denial

## 2022-05-05 ENCOUNTER — Other Ambulatory Visit: Payer: Self-pay | Admitting: Internal Medicine

## 2022-05-05 DIAGNOSIS — M79604 Pain in right leg: Secondary | ICD-10-CM

## 2022-05-08 NOTE — Telephone Encounter (Signed)
DECISION :      Denied on August 3   Denied-Partial. The amount requested is over the limit allowed by your plan. You filled a total of 180 days. This prescription may only be filled for an amount of 180 day supply in365 days. You are trying to refill this drug too soon. You can refill it on 05/05/2022. Drug Varenicline Tartrate 1MG  tablets Form [employer] of Electronic Prior Authorization Request        ( No pa needed ( AT THIS TIME )  pt is trying to fill to early -- COPY SENT TO PHARMACY ALSO )

## 2022-05-11 ENCOUNTER — Other Ambulatory Visit: Payer: Self-pay | Admitting: Internal Medicine

## 2022-05-11 ENCOUNTER — Encounter: Payer: Medicaid Other | Admitting: Student

## 2022-05-11 DIAGNOSIS — E1169 Type 2 diabetes mellitus with other specified complication: Secondary | ICD-10-CM

## 2022-05-11 DIAGNOSIS — E119 Type 2 diabetes mellitus without complications: Secondary | ICD-10-CM

## 2022-05-18 ENCOUNTER — Encounter: Payer: Medicaid Other | Admitting: Student

## 2022-05-23 ENCOUNTER — Encounter: Payer: Medicaid Other | Admitting: Internal Medicine

## 2022-05-23 ENCOUNTER — Telehealth: Payer: Self-pay | Admitting: *Deleted

## 2022-05-23 NOTE — Progress Notes (Deleted)
Leg pain: Patient has been conservatively managed with Voltaren gel over the last 1 month. Physical therapy referral placed with their clinic attempting to contact patient to set up appointments but have not had success reaching him yet.   Patient reports she has been having right leg pain for the past 2 to 3 months.  He describes this as a burning pain to his right knee that radiates down to his foot.  He states that he has not tried anything for this pain.  He reports that the pain becomes worse with weightbearing and squatting.  He currently works at a [employer]/land [employer] and states that his pain gets worse at work.  His pain is relieved by rest.  Patient informs me that his left leg is completely fine.  He denies any back pain, hip pain, or foot pain.  He only is concerned about his right knee down to his ankle  On exam, he has a limp with walking.  Patient has no tenderness to the right leg.  He does report pain with passive range of motion.  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  This is likely musculoskeletal related.  This can either be patellofemoral syndrome versus iliotibial band syndrome.  The treatment will be conservative.

## 2022-05-23 NOTE — Telephone Encounter (Signed)
Called patient regarding his missed appointment. Patient needs to call the main office number [phone] to reschedule

## 2022-05-24 ENCOUNTER — Encounter: Payer: Self-pay | Admitting: Student

## 2022-06-01 ENCOUNTER — Other Ambulatory Visit: Payer: Self-pay | Admitting: Internal Medicine

## 2022-06-01 DIAGNOSIS — E1159 Type 2 diabetes mellitus with other circulatory complications: Secondary | ICD-10-CM

## 2022-06-02 DIAGNOSIS — Z419 Encounter for procedure for purposes other than remedying health state, unspecified: Secondary | ICD-10-CM | POA: Diagnosis not present

## 2022-07-02 DIAGNOSIS — Z419 Encounter for procedure for purposes other than remedying health state, unspecified: Secondary | ICD-10-CM | POA: Diagnosis not present

## 2022-07-06 ENCOUNTER — Ambulatory Visit (INDEPENDENT_AMBULATORY_CARE_PROVIDER_SITE_OTHER): Payer: Medicaid Other | Admitting: Student

## 2022-07-06 VITALS — BP 150/97 | HR 96 | Temp 98.2°F | Ht 73.0 in | Wt 189.0 lb

## 2022-07-06 DIAGNOSIS — F1011 Alcohol abuse, in remission: Secondary | ICD-10-CM | POA: Diagnosis not present

## 2022-07-06 DIAGNOSIS — N529 Male erectile dysfunction, unspecified: Secondary | ICD-10-CM

## 2022-07-06 DIAGNOSIS — Z7984 Long term (current) use of oral hypoglycemic drugs: Secondary | ICD-10-CM | POA: Diagnosis not present

## 2022-07-06 DIAGNOSIS — G4700 Insomnia, unspecified: Secondary | ICD-10-CM

## 2022-07-06 DIAGNOSIS — L97519 Non-pressure chronic ulcer of other part of right foot with unspecified severity: Secondary | ICD-10-CM

## 2022-07-06 DIAGNOSIS — L03031 Cellulitis of right toe: Secondary | ICD-10-CM

## 2022-07-06 DIAGNOSIS — L0390 Cellulitis, unspecified: Secondary | ICD-10-CM | POA: Insufficient documentation

## 2022-07-06 DIAGNOSIS — E119 Type 2 diabetes mellitus without complications: Secondary | ICD-10-CM

## 2022-07-06 DIAGNOSIS — J449 Chronic obstructive pulmonary disease, unspecified: Secondary | ICD-10-CM | POA: Diagnosis not present

## 2022-07-06 HISTORY — DX: Non-pressure chronic ulcer of other part of right foot with unspecified severity: L97.519

## 2022-07-06 LAB — POCT GLYCOSYLATED HEMOGLOBIN (HGB A1C): Hemoglobin A1C: 7.1 % — AB (ref 4.0–5.6)

## 2022-07-06 LAB — GLUCOSE, CAPILLARY: Glucose-Capillary: 115 mg/dL — ABNORMAL HIGH (ref 70–99)

## 2022-07-06 MED ORDER — ACCU-CHEK AVIVA PLUS VI STRP: ORAL_STRIP | 12 refills | Status: DC

## 2022-07-06 MED ORDER — STIOLTO RESPIMAT 2.5-2.5 MCG/ACT IN AERS: 2.0000 | INHALATION_SPRAY | Freq: Every day | RESPIRATORY_TRACT | 1 refills | Status: DC

## 2022-07-06 MED ORDER — CLINDAMYCIN HCL 150 MG PO CAPS: 450.0000 mg | ORAL_CAPSULE | Freq: Three times a day (TID) | ORAL | 0 refills | Status: AC

## 2022-07-06 MED ORDER — METFORMIN HCL 1000 MG PO TABS: 1000.0000 mg | ORAL_TABLET | Freq: Two times a day (BID) | ORAL | 2 refills | Status: DC

## 2022-07-06 MED ORDER — CIPROFLOXACIN HCL 750 MG PO TABS: 750.0000 mg | ORAL_TABLET | Freq: Two times a day (BID) | ORAL | 0 refills | Status: AC

## 2022-07-06 NOTE — Assessment & Plan Note (Signed)
Patient states the amitriptyline is not working for his insomnia, however he may not be taking it as directed.  He did state that his prior medication for insomnia did work better.  We will need to discuss this at a further appointment as his acute complaint of cellulitis did not allow enough time to address this. - Continue with amitriptyline and discuss in the future

## 2022-07-06 NOTE — Assessment & Plan Note (Signed)
Patient presents with a nonhealing wound on his right first toe that started after swimming in a river barefoot about 2 months ago.  He states that it is been slowly getting worse and had previously had drainage.  Today there is no drainage but there is a 3 to 4 cm laceration on the distal right first toe with surrounded macerated tissue.  There does not appear to be any necrotic tissue.  There are no signs of deep tissue infection at this point and no signs of bacteremia.  We will start oral antibiotics as below. - Clindamycin 450 mg 3 times a day for 14 days - Ciprofloxacin 750 mg twice a day for 14 days - Return in 1 week for repeat evaluation

## 2022-07-06 NOTE — Assessment & Plan Note (Signed)
Patient currently drinks about 18 beers a day and states that he had 90-day abstinence in the last 2 years without symptoms of withdrawal.  He also states that he gets the shakes whenever he does not drink sometimes.  This will need to be addressed at future visits.

## 2022-07-06 NOTE — Assessment & Plan Note (Signed)
Patient complains of trouble with obtaining, maintaining, and having an erection adequate for intercourse.  He does still have nocturnal erections.  He does take nitrates for chest pain but has not used these in a long time he states.  We will discuss this at a further visit.

## 2022-07-06 NOTE — Progress Notes (Deleted)
HTN: amlodipine, enalapril -cmp -urine protein  Stable angina  COPD: stiolto respimat  GERD: protonix  HLD: atorva  T2DM: invokana, metformin. A1c 7.0 04/2022.  TBI  AUD  Insomnia  IDA   Voltaren gel Viagra Chantix  Pulm for pfts? PT?

## 2022-07-06 NOTE — Progress Notes (Signed)
   CC: Wound on right first toe  HPI:  [name] is a 56 y.o. with history as below who presents for a scheduled follow-up with an acute complaint of a wound on his right toe that has been present for the past 2 months.  See problem based charting for assessment and plan.  Past Medical History:  Diagnosis Date   Asthma    Counseling regarding advanced directives and goals of care 12/01/2020   DNR/DNI MOST form filled out 12/01/20   ETOH abuse    Hypertension    TBI (traumatic brain injury) ([place])    Type 2 diabetes mellitus, uncontrolled 2010   Review of Systems:   Painful and swollen right first toe with an unhealing wound.  Urinary urge incontinence, insomnia, blurry vision. No chest pain, fevers, chills, nausea, vomiting, pain/redness/warmth of either lower extremity above the ankles.  Physical Exam:  Vitals:   07/06/22 1018  BP: (!) 150/97  Pulse: 96  Temp: 98.2 F (36.8 C)  TempSrc: Oral  SpO2: 100%  Weight: 189 lb (85.7 kg)  Height: 6\' 1"  (1.854 m)   Constitutional: Elderly male sitting in chair. In no acute distress. HENT: Normocephalic and atraumatic Eyes: No scleral icterus Cardio:Regular rate and rhythm. No murmurs, rubs, or gallops. Pulm: Moderate diffuse wheezes diffusely. Normal work of breathing on room air.  2+ bilateral radial and pedal pulses. MSK: Negative for lower extremity edema other than the area on his right first toe described under skin. Skin: 3 to 4 cm laceration on the anterior medial right first toe with macerated tissue surrounding.  No appreciable drainage on palpation.  Erythema, edema, and warmth extending near to the proximal metatarsophalangeal joint of the first toe.  Pain to palpation on the dorsal aspect of the right great toe, stable neuropathy without pain on the plantar right great toe.  Stable calluses proximal to the proximal metatarsophalangeal joint bilateral feet.  See photos below for reference of the right foot. Neuro:Alert  and oriented x3. No focal deficit noted. Psych:Pleasant mood and affect.       Assessment & Plan:   See Encounters Tab for problem based charting.  Patient seen with Dr. Daryll Drown

## 2022-07-06 NOTE — Patient Instructions (Signed)
Thank you, [name], for allowing [place] to provide your care today. Today we discussed . . .  > Right big toe cellulitis       -We talked about the wound you have on your right big toe that happened about 2 months ago [place].  It looks like it is infected and we are going to start you on a course of antibiotics.  Clindamycin 450 mg 3 times a day and ciprofloxacin 750 mg twice a day for 14 days.  Please tell your sister and her sister in law about these medications so they may help you take them correctly.  Please go to the hospital and call our office with the number, [phone], a few have any worsening of the infection in your toe such as spreading of the swelling/redness/pain up your foot or leg or if there is increased drainage from the wound itself.  Also call our office or go to the hospital if you have any worsening fevers, severe fatigue, fainting, or other worrisome symptoms. > Erectile dysfunction/urinary urgency incontinence/insomnia       -We discussed these issues today and decided that we will tackle the infection in your foot and then work on fixing these.  If you have any questions or concerns please call our office. > Diabetes       -We refill your metformin today.  Your A1c was 7.1%.  If you run out of any other medication please call the office to get a refill.   I have ordered the following labs for you:  Lab Orders         Glucose, capillary         POC Hbg A1C       Tests ordered today:  None   Referrals ordered today:   Referral Orders  No referral(s) requested today      I have ordered the following medication/changed the following medications:   Stop the following medications: None  Start the following medications: Meds ordered this encounter  Medications   clindamycin (CLEOCIN) 150 MG capsule    Sig: Take 3 capsules (450 mg total) by mouth 3 (three) times daily for 14 days.    Dispense:  126 capsule    Refill:  0    ciprofloxacin (CIPRO) 750 MG tablet    Sig: Take 1 tablet (750 mg total) by mouth 2 (two) times daily for 14 days.    Dispense:  28 tablet    Refill:  0   glucose blood (ACCU-CHEK AVIVA PLUS) test strip    Sig: Use as instructed    Dispense:  100 each    Refill:  12   metFORMIN (GLUCOPHAGE) 1000 MG tablet    Sig: Take 1 tablet (1,000 mg total) by mouth 2 (two) times daily with a meal.    Dispense:  360 tablet    Refill:  2    This prescription was filled on 03/09/2021. Any refills authorized will be placed on file.   Tiotropium Bromide-Olodaterol (STIOLTO RESPIMAT) 2.5-2.5 MCG/ACT AERS    Sig: Inhale 2 puffs into the lungs daily.    Dispense:  4 g    Refill:  1    This prescription was filled on 03/13/2022. Any refills authorized will be placed on file.      Follow up: In 1 week to reevaluate your wound.   Remember: Go to the hospital and call our office at the number below if you have any worsening  symptoms or any questions.   Should you have any questions or concerns please call the internal medicine clinic at [phone].     Ruben Pierce, [place]

## 2022-07-06 NOTE — Assessment & Plan Note (Addendum)
See cellulitis for further details of acute complaint. Patient has been out of metformin for the past 2 months.  A1c today 7.1% up from 7% 3 months ago.  Glucometer reviewed with blood sugars ranging from 90-220 with the majority being in the range of 150-200.  We will refill his metformin and treat his acute cellulitis as noted in the cellulitis tab.  Patient will also need a new eye exam as he states has been over 2 years since his last one.  Prior referral placed on 06/2021 May need to be repeated.

## 2022-07-06 NOTE — Assessment & Plan Note (Signed)
No current respiratory complaints he would like refill of his inhaler.

## 2022-07-14 NOTE — Progress Notes (Signed)
Internal Medicine Clinic Attending  Case discussed with Dr. Nikki Dom  at the time of the visit.  We reviewed the resident's history and exam and pertinent patient test results.  I agree with the assessment, diagnosis, and plan of care documented in the resident's note.   Patient did not schedule with [place] in 1 week, however, he has a scheduled appointment with podiatry on 10/16.  If he does not keep this appointment, we will need to schedule him in our clinic in the near future to assess his toe.

## 2022-07-17 ENCOUNTER — Ambulatory Visit: Payer: Medicaid Other | Admitting: Podiatry

## 2022-07-25 ENCOUNTER — Encounter: Payer: Self-pay | Admitting: Podiatry

## 2022-07-25 ENCOUNTER — Ambulatory Visit (INDEPENDENT_AMBULATORY_CARE_PROVIDER_SITE_OTHER): Payer: Medicaid Other | Admitting: Podiatry

## 2022-07-25 ENCOUNTER — Ambulatory Visit (INDEPENDENT_AMBULATORY_CARE_PROVIDER_SITE_OTHER): Payer: Medicaid Other

## 2022-07-25 DIAGNOSIS — Z720 Tobacco use: Secondary | ICD-10-CM | POA: Diagnosis not present

## 2022-07-25 DIAGNOSIS — L97512 Non-pressure chronic ulcer of other part of right foot with fat layer exposed: Secondary | ICD-10-CM

## 2022-07-25 DIAGNOSIS — I739 Peripheral vascular disease, unspecified: Secondary | ICD-10-CM | POA: Diagnosis not present

## 2022-07-25 MED ORDER — GENTAMICIN SULFATE 0.1 % EX CREA: 1.0000 | TOPICAL_CREAM | Freq: Every day | CUTANEOUS | 0 refills | Status: AC

## 2022-07-25 NOTE — Patient Instructions (Signed)
Call [phone] to schedule your vascular testing:  [facility] [address], [place], [place] [address]  

## 2022-07-26 ENCOUNTER — Ambulatory Visit: Payer: Medicaid Other | Admitting: Podiatry

## 2022-07-26 NOTE — Progress Notes (Signed)
  Subjective:  Patient ID: Ruben Pierce, male    DOB: Feb 07, 1966,  MRN: [id number]  Chief Complaint  Patient presents with   Toe Pain    Hallux bilateral (R>L) - [place] and feet got cut up, toes are slow to heal, especially right hallux, been using neosporin and bandaid to cover, diabetic - last a1c was 7.1    56 y.o. male presents with the above complaint. History confirmed with patient.   Objective:  Physical Exam: warm, good capillary refill and +2 pulses on left foot, right foot has +1 DP and PT, decree sensation, full-thickness ulceration on right hallux measuring 1.5 x 1.0 x 0.2 cm, exposed subcutaneous tissue.     Radiographs: Multiple views x-ray of the right foot: osteoarthrosis of IPJ and MTPJ, no clear evidence of osteomyelitis Assessment:   1. Skin ulcer of right great toe with fat layer exposed ([place])   2. Tobacco abuse   3. PAD (peripheral artery disease) ([place])      Plan:  Patient was evaluated and treated and all questions answered.  Ulcer right hallux -We discussed the etiology and factors that are a part of the wound healing process.  We also discussed the risk of infection both soft tissue and osteomyelitis from open ulceration.  Discussed the risk of limb loss if this happens or worsens. -Debridement as below. -Dressed with Iodosorb, DSD. -Continue home dressing changes  3 times weekly with 4 x 4 gauze and gentamicin ointment.  Rx sent to pharmacy -Vascular testing I recommended ABIs to evaluate his peripheral circulation and this was ordered -HgbA1c: 7.1% -Last antibiotics: Currently no clear evidence of local infection or systemic infection.  Gentamicin ointment should be sufficient at this point, advised on signs symptoms of worsening infection -Imaging: x-ray reviewed, shows no signs of erosions, osteolysis, osteomyelitis or emphysema.  Procedure: Excisional Debridement of Wound Rationale: Removal of non-viable soft tissue  from the wound to promote healing.  Anesthesia: none Post-Debridement Wound Measurements: 1.5 cm x 1.0 cm x 0.2 cm  Type of Debridement: Sharp Excisional Tissue Removed: Non-viable soft tissue Depth of Debridement: subcutaneous tissue. Technique: Sharp excisional debridement to bleeding, viable wound base.  Dressing: Dry, sterile, compression dressing. Disposition: Patient tolerated procedure well.      Return in about 3 weeks (around 08/15/2022) for wound care.

## 2022-08-01 NOTE — Progress Notes (Deleted)
HTN Enalapril 10 mg Amlodipine 10 mg  COPD Stiolto( tiotrop-olodaterol)  GERD Protonix 40   T2DM A1c 7.1% 07/2022 Metformin 1000 BID Invokana(canaglaflozin) 300 daily Resolution of cellulitis 07/2022 Eye exam?  R great toe ulcer Saw podiatry 10/24 with debridement, home dressing changes Gentamicin ointment No osteo on xray 3 week follow up ABI performed   HLD LDL 20 04/2021 Repeat lipids? -lipitor 80mg   HCM flu

## 2022-08-02 ENCOUNTER — Encounter: Payer: Self-pay | Admitting: Student

## 2022-08-02 DIAGNOSIS — Z419 Encounter for procedure for purposes other than remedying health state, unspecified: Secondary | ICD-10-CM | POA: Diagnosis not present

## 2022-08-09 ENCOUNTER — Other Ambulatory Visit: Payer: Self-pay | Admitting: Student

## 2022-08-28 ENCOUNTER — Other Ambulatory Visit: Payer: Self-pay | Admitting: Internal Medicine

## 2022-08-28 NOTE — Telephone Encounter (Signed)
Next appt scheduled tomorrow 11/28 with PCP.

## 2022-08-29 ENCOUNTER — Other Ambulatory Visit (HOSPITAL_COMMUNITY): Payer: Self-pay

## 2022-08-29 ENCOUNTER — Ambulatory Visit (INDEPENDENT_AMBULATORY_CARE_PROVIDER_SITE_OTHER): Payer: Medicaid Other | Admitting: Student

## 2022-08-29 VITALS — BP 112/66 | HR 106 | Temp 98.4°F | Ht 73.0 in | Wt 195.5 lb

## 2022-08-29 DIAGNOSIS — I2089 Other forms of angina pectoris: Secondary | ICD-10-CM | POA: Diagnosis not present

## 2022-08-29 DIAGNOSIS — G4700 Insomnia, unspecified: Secondary | ICD-10-CM | POA: Diagnosis not present

## 2022-08-29 DIAGNOSIS — N529 Male erectile dysfunction, unspecified: Secondary | ICD-10-CM

## 2022-08-29 DIAGNOSIS — S91109D Unspecified open wound of unspecified toe(s) without damage to nail, subsequent encounter: Secondary | ICD-10-CM | POA: Diagnosis not present

## 2022-08-29 DIAGNOSIS — L03031 Cellulitis of right toe: Secondary | ICD-10-CM

## 2022-08-29 MED ORDER — IBUPROFEN 200 MG PO TABS
200.0000 mg | ORAL_TABLET | Freq: Four times a day (QID) | ORAL | 2 refills | Status: DC | PRN
  Filled 2022-08-29: qty 90, 23d supply, fill #0

## 2022-08-29 MED ORDER — GENTAMICIN SULFATE 0.1 % EX OINT: 1.0000 | TOPICAL_OINTMENT | Freq: Three times a day (TID) | CUTANEOUS | 0 refills | Status: DC

## 2022-08-29 MED ORDER — ACETAMINOPHEN 500 MG PO TABS
1000.0000 mg | ORAL_TABLET | Freq: Three times a day (TID) | ORAL | 2 refills | Status: AC | PRN
  Filled 2022-08-29: qty 180, 30d supply, fill #0

## 2022-08-29 MED ORDER — AMITRIPTYLINE HCL 100 MG PO TABS: 100.0000 mg | ORAL_TABLET | Freq: Every day | ORAL | 1 refills | Status: DC

## 2022-08-29 NOTE — Progress Notes (Unsigned)
   CC: Follow-up right toe wound  HPI:  [name] is a 56 y.o. male with history as below who presents for follow-up visit for cellulitis of his right first toe.  Please see encounters tab for problem-based charting.  Past Medical History:  Diagnosis Date   Asthma    Counseling regarding advanced directives and goals of care 12/01/2020   DNR/DNI MOST form filled out 12/01/20   ETOH abuse    Hypertension    TBI (traumatic brain injury) (HCC)    Type 2 diabetes mellitus, uncontrolled 2010   Review of Systems:   A comprehensive review of systems was negative except for: Right toe pain and wound, erectile dysfunction, insomnia   Physical Exam:  Vitals:   08/29/22 1455  BP: 112/66  Pulse: (!) 106  Temp: 98.4 F (36.9 C)  TempSrc: Oral  SpO2: 98%  Weight: 195 lb 8 oz (88.7 kg)  Height: 6\' 1"  (1.854 m)   Constitutional: Unkempt middle-age male who appears older than stated age. In no acute distress. HENT: Normocephalic, atraumatic,  Eyes: Sclera non-icteric, EOM intact Cardio:Regular rate and rhythm. No murmurs, rubs, or gallops.  Diminished left dorsalis pedis pulse, palpable radial and right dorsalis pedis pulse Pulm:Clear to auscultation bilaterally. Normal work of breathing on room air. for extremity edema. Skin: Overall stable 3-4 cm laceration medial anterior aspect of his right first toe with surrounding callus.  No drainage on palpation of the wound.  Stable edema, erythema, but decreased warmth from prior extending from his toe just proximal to his proximal metatarsophalangeal joint. Neuro:Alert and oriented x3. No focal deficit noted. Psych:Pleasant mood and affect.        Assessment & Plan:   See Encounters Tab for problem based charting.  Patient seen with Dr. IYM:EBRAXENM

## 2022-08-29 NOTE — Patient Instructions (Signed)
  Thank you, [name], for allowing [place] to provide your care today. Today we discussed . . .  > Toe wound       -We have sent in some gentamicin ointment that you should use on your toe 3 times a day.  Please also continue to change her dressing and follow-up with podiatry.  We will call their office to set up this appointment.  Please call our office at [phone] from your sister's phone and give [place] her phone number. > Erectile dysfunction       -Before we prescribe you medication for your erectile dysfunction we would like you to be seen by cardiology to evaluate your heart.  We would like to make sure that your heart is healthy enough for sexual activity before prescribing this medication.  We will send a referral to cardiology and they should call your sister to help set up the appointment.   I have ordered the following labs for you:  Lab Orders  No laboratory test(s) ordered today      Tests ordered today:  ABIs   Referrals ordered today:   Referral Orders  No referral(s) requested today      I have ordered the following medication/changed the following medications:   Stop the following medications: Medications Discontinued During This Encounter  Medication Reason   nitroGLYCERIN (NITROSTAT) 0.4 MG SL tablet Patient has not taken in last 30 days   amitriptyline (ELAVIL) 100 MG tablet Reorder     Start the following medications: Meds ordered this encounter  Medications   amitriptyline (ELAVIL) 100 MG tablet    Sig: Take 1 tablet (100 mg total) by mouth at bedtime.    Dispense:  90 tablet    Refill:  1    This prescription was filled on 05/01/2022. Any refills authorized will be placed on file.      Follow up:  1 Month     Remember:     Should you have any questions or concerns please call the internal medicine clinic at [phone].     Ruben Morel, DO [facility]

## 2022-08-30 ENCOUNTER — Telehealth: Payer: Self-pay | Admitting: *Deleted

## 2022-08-30 NOTE — Assessment & Plan Note (Addendum)
Patient again notes that he has trouble with insomnia but this time states that amitriptyline has worked for him in the past.  Last visit he may have been confused and said that amitriptyline was not really working.  He was able to describe the pill and pointed out on pill identifier as well as confirm the dosage of his amitriptyline.  Since this worked well for him in the past and he appeared to tolerate it very well we will restart this. - Amitriptyline 100 mg nightly as needed for sleep

## 2022-08-30 NOTE — Telephone Encounter (Signed)
Call to [facility].  Patient given an appointment for 09/01/2022 at 10:15 AM with Dr. Leary Roca.  Attempts to notify patient through his sister -unable to leave message as mailbox is full.  Attempts to call patient unable to reach at contact number.

## 2022-08-30 NOTE — Assessment & Plan Note (Signed)
Patient again inquires about Viagra prescription which he has previously used.  He has a history of what appears to be stable angina and has been prescribed sublingual nitroglycerin in the past.  He notes that he has not had this pain in a long time and that he has not had those pills in a while either.  He was referred to cardiology for further workup after having his complaints originally but never made it to the referral.  We discussed the dangers of taking sildenafil if his heart is not healthy enough for sexual activity and that like in the past refer him to have a cardiology evaluation before prescribing sildenafil.  We also discussed smoking cessation that would likely also help with his erectile dysfunction. - Referral to cardiology

## 2022-08-30 NOTE — Assessment & Plan Note (Signed)
Patient presents for reevaluation of the wound and cellulitis on his right great toe.  Since his last visit on 07/06/2022 he has gone to see podiatry and had a debridement.  Oral antibiotics were stopped at that time and gentamicin ointment along with good wound care and dressing changes were continued.  He was supposed to follow-up with podiatry about 2 weeks ago but did not do this.  On exam his toe appears overall stable but does not appear to be healing in any significant way.  There is not appear to be any new or worsening infection.  It actually appears that the infection has been controlled and the major issue is his healing.  We discussed tobacco cessation in addition to continuing with gentamicin ointment and good wound care as well as following up with podiatry to help with his healing.  ABIs today were normal with left score of 1.2 and right score of 1.33. - Follow-up with podiatry and continue with wound care and dressing changes as well as gentamicin ointment - Return in 1 month for reevaluation

## 2022-08-31 NOTE — Telephone Encounter (Signed)
Attempts to reach patient's sister to inform her of patient's appointment at [facility] on tomorrow morning at 10:15 AM.. Call to patient's sister at her new number  [phone] .  Informed her of the appointment.  She will inform patient when he returns home today.

## 2022-09-01 ENCOUNTER — Ambulatory Visit: Payer: Medicaid Other | Admitting: Podiatry

## 2022-09-01 DIAGNOSIS — Z419 Encounter for procedure for purposes other than remedying health state, unspecified: Secondary | ICD-10-CM | POA: Diagnosis not present

## 2022-09-04 NOTE — Progress Notes (Signed)
Internal Medicine Clinic Attending  I saw and evaluated the patient.  I personally confirmed the key portions of the history and exam documented by the resident  and I reviewed pertinent patient test results.  The assessment, diagnosis, and plan were formulated together and I agree with the documentation in the resident's note.  

## 2022-09-06 ENCOUNTER — Ambulatory Visit: Payer: Medicaid Other | Admitting: Podiatry

## 2022-09-08 ENCOUNTER — Telehealth: Payer: Self-pay | Admitting: *Deleted

## 2022-09-08 NOTE — Telephone Encounter (Signed)
Call from patient and his sister requesting refill on his sleep medication.  Patient when asked did not know the name of the medication.  Only tht it was a brown pill that he takes every night.  Patient states has not taken in a while.  Patient also stated that he goes to bed around 10 pm and awakens about 3 am unable to go back to sleep.  Call to [facility] who had previously spoken to patient and did not know what medication he was requesting.  Upon review of patient's medication he is on Elavil 100 mg.  Patient last got  medication on 07/25/2022 90 tablets.  Should have 40 left.   Patient still insist that he does not and did not pick up the prescription.  Sister to go to patient's home and see what medications he has.  Patient when asked stayed that he went to the foot doctor but arrived to late.  Expressed to patient for need to follow up for his foot.  States will have sis ter call the [facility] to reschedule the appointment.

## 2022-09-09 ENCOUNTER — Other Ambulatory Visit: Payer: Self-pay | Admitting: Internal Medicine

## 2022-09-09 DIAGNOSIS — E119 Type 2 diabetes mellitus without complications: Secondary | ICD-10-CM

## 2022-09-11 ENCOUNTER — Other Ambulatory Visit: Payer: Self-pay | Admitting: Student

## 2022-09-11 DIAGNOSIS — G4700 Insomnia, unspecified: Secondary | ICD-10-CM

## 2022-09-15 ENCOUNTER — Other Ambulatory Visit: Payer: Self-pay | Admitting: Student

## 2022-09-15 DIAGNOSIS — G4700 Insomnia, unspecified: Secondary | ICD-10-CM

## 2022-09-15 MED ORDER — AMITRIPTYLINE HCL 100 MG PO TABS: 100.0000 mg | ORAL_TABLET | Freq: Every day | ORAL | 1 refills | Status: DC

## 2022-09-27 ENCOUNTER — Ambulatory Visit (INDEPENDENT_AMBULATORY_CARE_PROVIDER_SITE_OTHER): Payer: Medicaid Other | Admitting: Podiatry

## 2022-09-27 DIAGNOSIS — L600 Ingrowing nail: Secondary | ICD-10-CM

## 2022-09-27 NOTE — Patient Instructions (Signed)

## 2022-09-27 NOTE — Progress Notes (Signed)
Subjective:   Patient ID: Ruben Pierce, male   DOB: 56 y.o.   MRN: [id number]   HPI Patient states he traumatized his right big toe and has developed callus formation on it since then and it is doing somewhat better since Dr. Lilian Kapur saw it but it still can be aggravating   ROS      Objective:  Physical Exam  Neurovascular status unchanged with patient smoking and does have vascular disease has severe keratotic tissue on the right hallux with some nail that is formed localized to this area     Assessment:  Damaged tissue right secondary to an injury sustained in the summer when he was tubing and irritated his toe     Plan:  Reviewed condition and went ahead anesthetized the right hallux sterile instrumentation debrided the tissue again it appears healthy underneath advised on soaks gave strict instructions if any erythema edema drainage were to occur to let us know immediately and understands he is at high risk for eventual loss of the big toe and amputation.  If anything were to occur he is to see me or Dr. Lilian Kapur I am hopeful that the debridement today will give him good relief of symptoms

## 2022-10-02 DIAGNOSIS — Z419 Encounter for procedure for purposes other than remedying health state, unspecified: Secondary | ICD-10-CM | POA: Diagnosis not present

## 2022-10-13 ENCOUNTER — Ambulatory Visit: Payer: Medicaid Other | Admitting: Cardiovascular Disease

## 2022-11-02 DIAGNOSIS — Z419 Encounter for procedure for purposes other than remedying health state, unspecified: Secondary | ICD-10-CM | POA: Diagnosis not present

## 2022-11-03 ENCOUNTER — Ambulatory Visit: Payer: Medicaid Other | Admitting: Cardiology

## 2022-11-27 ENCOUNTER — Other Ambulatory Visit: Payer: Self-pay | Admitting: Student

## 2022-11-28 NOTE — Telephone Encounter (Signed)
Next appt scheduled 2/29 with Dr Collene Gobble.

## 2022-11-30 ENCOUNTER — Ambulatory Visit (INDEPENDENT_AMBULATORY_CARE_PROVIDER_SITE_OTHER): Payer: Medicaid Other | Admitting: Student

## 2022-11-30 VITALS — BP 145/86 | Temp 98.2°F | Ht 73.0 in | Wt 191.9 lb

## 2022-11-30 DIAGNOSIS — E1169 Type 2 diabetes mellitus with other specified complication: Secondary | ICD-10-CM

## 2022-11-30 DIAGNOSIS — E785 Hyperlipidemia, unspecified: Secondary | ICD-10-CM | POA: Diagnosis not present

## 2022-11-30 DIAGNOSIS — E119 Type 2 diabetes mellitus without complications: Secondary | ICD-10-CM | POA: Diagnosis not present

## 2022-11-30 DIAGNOSIS — F17210 Nicotine dependence, cigarettes, uncomplicated: Secondary | ICD-10-CM

## 2022-11-30 DIAGNOSIS — Z7984 Long term (current) use of oral hypoglycemic drugs: Secondary | ICD-10-CM | POA: Diagnosis not present

## 2022-11-30 DIAGNOSIS — S069X9S Unspecified intracranial injury with loss of consciousness of unspecified duration, sequela: Secondary | ICD-10-CM

## 2022-11-30 DIAGNOSIS — S91109D Unspecified open wound of unspecified toe(s) without damage to nail, subsequent encounter: Secondary | ICD-10-CM

## 2022-11-30 DIAGNOSIS — R0989 Other specified symptoms and signs involving the circulatory and respiratory systems: Secondary | ICD-10-CM

## 2022-11-30 DIAGNOSIS — S069X9D Unspecified intracranial injury with loss of consciousness of unspecified duration, subsequent encounter: Secondary | ICD-10-CM | POA: Diagnosis not present

## 2022-11-30 DIAGNOSIS — L97511 Non-pressure chronic ulcer of other part of right foot limited to breakdown of skin: Secondary | ICD-10-CM | POA: Diagnosis not present

## 2022-11-30 DIAGNOSIS — F17209 Nicotine dependence, unspecified, with unspecified nicotine-induced disorders: Secondary | ICD-10-CM

## 2022-11-30 LAB — POCT GLYCOSYLATED HEMOGLOBIN (HGB A1C): Hemoglobin A1C: 7.9 % — AB (ref 4.0–5.6)

## 2022-11-30 LAB — GLUCOSE, CAPILLARY: Glucose-Capillary: 109 mg/dL — ABNORMAL HIGH (ref 70–99)

## 2022-11-30 MED ORDER — ASPIRIN 81 MG PO TBEC: 81.0000 mg | DELAYED_RELEASE_TABLET | Freq: Every day | ORAL | 2 refills | Status: AC

## 2022-11-30 NOTE — Patient Instructions (Signed)
[name], it was a pleasure seeing you today!  Today we discussed: - I would like for you to start taking aspirin every day. I would also like for you to do a procedure to test the blood flow in your legs.  - I am going to get lab work and urine studies today. I will call you with the results.   I have ordered the following labs today:   Lab Orders         Glucose, capillary         BMP8+Anion Gap         Microalbumin / Creatinine Urine Ratio         Lipid Profile         POC Hbg A1C      Tests ordered today:  ABI  I have ordered the following medication/changed the following medications:   Start the following medications: Meds ordered this encounter  Medications   aspirin EC 81 MG tablet    Sig: Take 1 tablet (81 mg total) by mouth daily. Swallow whole.    Dispense:  90 tablet    Refill:  2     Follow-up: 3 months   Please make sure to arrive 15 minutes prior to your next appointment. If you arrive late, you may be asked to reschedule.   We look forward to seeing you next time. Please call our clinic at [phone] if you have any questions or concerns. The best time to call is Monday-Friday from 9am-4pm, but there is someone available 24/7. If after hours or the weekend, call the main hospital number and ask for the Internal Medicine Resident On-Call. If you need medication refills, please notify your pharmacy one week in advance and they will send [place] a request.  Thank you for letting us take part in your care. Wishing you the best!  Thank you, Sanjuan Dame, MD

## 2022-12-01 DIAGNOSIS — Z419 Encounter for procedure for purposes other than remedying health state, unspecified: Secondary | ICD-10-CM | POA: Diagnosis not present

## 2022-12-01 NOTE — Assessment & Plan Note (Signed)
Patient reports he gets a disability check each month. However, three weeks ago his caretaker who took care of the money died. He reports his case manager asked a for a letter from his doctor. Ruben Pierce states his caretaker taught him how to do the bills and handle money, and he feels comfortable with this. I have written a letter stating Mr. Justen takes his medications and follows with our clinic regularly. Patient will need to follow-up with his case manager for further assistance.

## 2022-12-01 NOTE — Assessment & Plan Note (Signed)
Patient presents today for re-evaluation of his right great toe. This started roughly six months after an injury while swimming. Since then has seen podiatry for debridement of wound and has completed oral antibiotics for cellulitis for this. He reports the wound overall looks better than previous, denies any bleeding or purulence. He denies any pain in the area.   On exam today there are two discreet eschars in the distal and medial side of the toe. Given his history of tobacco use, hyperlipidemia, and type II diabetes I am concerned for ischemic wounds. ABI's in the office were completed during his last visit in November, which were normal. Today on exam I do appreciate decreased pulses bilaterally. Today we will start him on aspirin for medical management of peripheral arterial disease. Will also obtain formal ABI's.  - Follow-up ABI's - Start aspirin '81mg'$  daily - Continue atorvastatin - Return precautions given

## 2022-12-01 NOTE — Progress Notes (Signed)
   CC: follow-up  HPI:  Mr.Treylon Navas is a 57 y.o. person with medical history as below presenting to [facility] for follow-up  Please see problem-based list for further details, assessments, and plans.  Past Medical History:  Diagnosis Date   Asthma    Counseling regarding advanced directives and goals of care 12/01/2020   DNR/DNI MOST form filled out 12/01/20   ETOH abuse    Hypertension    TBI (traumatic brain injury) ([place])    Type 2 diabetes mellitus, uncontrolled 2010   Review of Systems:  As per HPI  Physical Exam:  Vitals:   11/30/22 0948  BP: (!) 145/86  Temp: 98.2 F (36.8 C)  TempSrc: Oral  SpO2: 100%  Weight: 191 lb 14.4 oz (87 kg)  Height: '6\' 1"'$  (1.854 m)   General: Resting comfortably in no acute distress CV: Regular rate, rhythm. No murmurs appreciated. Dorsalis pedis and posterior tibialis pulses 1+ bilaterally. Pulm: Normal work of breathing on room air. Clear to auscultation bilaterally. Skin: R great toe with hardened fibrinous material with two discreet eschars on distal/medial side of toe.  Neuro: Awake, alert, conversing appropriately.  Psych: Normal mood, affect, speech.     Assessment & Plan:   Hyperlipidemia associated with type 2 diabetes mellitus ([place]) Last lipid panel 03/2021. Given decreased pulses and possible ischemic wound on toe, will repeat this today.  - Follow-up lipid panel - Continue atorvastatin daily   Type 2 diabetes mellitus (HCC) A1c 7.9% from 7.1% at last check. He has been compliant with metformin and Invokana, denies any adverse effects from these. He has been able to get these medications without difficulty. Denies any polyuria, polydipsia, vision changes. Patient is currently at goal with A1c <8%. If A1c continues to rise would consider addition of GLP-1.   - Continue Invokana '300mg'$  daily - Continue metformin '1000mg'$  twice daily - Urine microalbumin/creatinine today - BMP today  Chronic ulcer of right great toe  [facility]) Patient presents today for re-evaluation of his right great toe. This started roughly six months after an injury while swimming. Since then has seen podiatry for debridement of wound and has completed oral antibiotics for cellulitis for this. He reports the wound overall looks better than previous, denies any bleeding or purulence. He denies any pain in the area.   On exam today there are two discreet eschars in the distal and medial side of the toe. Given his history of tobacco use, hyperlipidemia, and type II diabetes I am concerned for ischemic wounds. ABI's in the office were completed during his last visit in November, which were normal. Today on exam I do appreciate decreased pulses bilaterally. Today we will start him on aspirin for medical management of peripheral arterial disease. Will also obtain formal ABI's.  - Follow-up ABI's - Start aspirin '81mg'$  daily - Continue atorvastatin - Return precautions given  [facility]BI (traumatic [facility]) [facility]) Patient reports he gets a disability check each month. However, three weeks ago his caretaker who took care of the money died. He reports his case manager asked a for a letter from his doctor. Mr. Doles states his caretaker taught him how to do the bills and handle money, and he feels comfortable with this. I have written a letter stating Mr. Cortez takes his medications and follows with our clinic regularly. Patient will need to follow-up with his case manager for further assistance.   Patient discussed with Dr. Jacquenette Shone, MD Internal Medicine PGY-3 Pager: [phone]

## 2022-12-01 NOTE — Assessment & Plan Note (Signed)
Last lipid panel 03/2021. Given decreased pulses and possible ischemic wound on toe, will repeat this today.  - Follow-up lipid panel - Continue atorvastatin daily

## 2022-12-01 NOTE — Assessment & Plan Note (Addendum)
A1c 7.9% from 7.1% at last check. He has been compliant with metformin and Invokana, denies any adverse effects from these. He has been able to get these medications without difficulty. Denies any polyuria, polydipsia, vision changes. Patient is currently at goal with A1c <8%. If A1c continues to rise would consider addition of GLP-1.   - Continue Invokana '300mg'$  daily - Continue metformin '1000mg'$  twice daily - Urine microalbumin/creatinine today - BMP today

## 2022-12-02 LAB — MICROALBUMIN / CREATININE URINE RATIO
Creatinine, Urine: 101 mg/dL
Microalb/Creat Ratio: 58 mg/g creat — ABNORMAL HIGH (ref 0–29)
Microalbumin, Urine: 58.2 ug/mL

## 2022-12-02 LAB — BMP8+ANION GAP
Anion Gap: 18 mmol/L (ref 10.0–18.0)
BUN/Creatinine Ratio: 15 (ref 9–20)
BUN: 15 mg/dL (ref 6–24)
CO2: 22 mmol/L (ref 20–29)
Calcium: 10.1 mg/dL (ref 8.7–10.2)
Chloride: 94 mmol/L — ABNORMAL LOW (ref 96–106)
Creatinine, Ser: 1.01 mg/dL (ref 0.76–1.27)
Glucose: 94 mg/dL (ref 70–99)
Potassium: 4.9 mmol/L (ref 3.5–5.2)
Sodium: 134 mmol/L (ref 134–144)
eGFR: 88 mL/min/{1.73_m2} (ref >59)

## 2022-12-02 LAB — LIPID PANEL
Chol/HDL Ratio: 3.4 ratio (ref 0.0–5.0)
Cholesterol, Total: 165 mg/dL (ref 100–199)
HDL: 49 mg/dL (ref >39)
LDL Chol Calc (NIH): 74 mg/dL (ref 0–99)
Triglycerides: 260 mg/dL — ABNORMAL HIGH (ref 0–149)
VLDL Cholesterol Cal: 42 mg/dL — ABNORMAL HIGH (ref 5–40)

## 2022-12-04 ENCOUNTER — Encounter: Payer: Self-pay | Admitting: Student

## 2022-12-04 NOTE — Progress Notes (Signed)
Internal Medicine Clinic Attending ? ?Case discussed with Dr. Braswell  At the time of the visit.  We reviewed the resident?s history and exam and pertinent patient test results.  I agree with the assessment, diagnosis, and plan of care documented in the resident?s note.  ?

## 2022-12-04 NOTE — Addendum Note (Signed)
Addended by: Lalla Brothers T on: 12/04/2022 09:02 AM   Modules accepted: Level of Service

## 2022-12-05 ENCOUNTER — Other Ambulatory Visit: Payer: Self-pay | Admitting: Internal Medicine

## 2022-12-05 ENCOUNTER — Other Ambulatory Visit: Payer: Self-pay | Admitting: Student

## 2022-12-05 DIAGNOSIS — J449 Chronic obstructive pulmonary disease, unspecified: Secondary | ICD-10-CM

## 2022-12-06 ENCOUNTER — Encounter (HOSPITAL_COMMUNITY): Payer: Self-pay

## 2022-12-06 ENCOUNTER — Emergency Department (HOSPITAL_COMMUNITY)
Admission: EM | Admit: 2022-12-06 | Discharge: 2022-12-06 | Disposition: A | Discharge status: Home / Self Care | Payer: Medicaid Other | Attending: Emergency Medicine | Admitting: Emergency Medicine

## 2022-12-06 ENCOUNTER — Emergency Department (HOSPITAL_COMMUNITY): Payer: Medicaid Other

## 2022-12-06 ENCOUNTER — Other Ambulatory Visit: Payer: Self-pay

## 2022-12-06 DIAGNOSIS — Z79899 Other long term (current) drug therapy: Secondary | ICD-10-CM | POA: Insufficient documentation

## 2022-12-06 DIAGNOSIS — J209 Acute bronchitis, unspecified: Secondary | ICD-10-CM | POA: Diagnosis not present

## 2022-12-06 DIAGNOSIS — D72829 Elevated white blood cell count, unspecified: Secondary | ICD-10-CM | POA: Insufficient documentation

## 2022-12-06 DIAGNOSIS — Z20822 Contact with and (suspected) exposure to covid-19: Secondary | ICD-10-CM | POA: Diagnosis not present

## 2022-12-06 DIAGNOSIS — Z743 Need for continuous supervision: Secondary | ICD-10-CM | POA: Diagnosis not present

## 2022-12-06 DIAGNOSIS — Z7982 Long term (current) use of aspirin: Secondary | ICD-10-CM | POA: Insufficient documentation

## 2022-12-06 DIAGNOSIS — Z7984 Long term (current) use of oral hypoglycemic drugs: Secondary | ICD-10-CM | POA: Insufficient documentation

## 2022-12-06 DIAGNOSIS — R000 Tachycardia, unspecified: Secondary | ICD-10-CM | POA: Diagnosis not present

## 2022-12-06 DIAGNOSIS — M79671 Pain in right foot: Secondary | ICD-10-CM | POA: Insufficient documentation

## 2022-12-06 DIAGNOSIS — E119 Type 2 diabetes mellitus without complications: Secondary | ICD-10-CM | POA: Diagnosis not present

## 2022-12-06 DIAGNOSIS — R52 Pain, unspecified: Secondary | ICD-10-CM | POA: Diagnosis not present

## 2022-12-06 DIAGNOSIS — F17200 Nicotine dependence, unspecified, uncomplicated: Secondary | ICD-10-CM | POA: Diagnosis not present

## 2022-12-06 LAB — RESP PANEL BY RT-PCR (RSV, FLU A&B, COVID)  RVPGX2
Influenza A by PCR: NEGATIVE
Influenza B by PCR: NEGATIVE
Resp Syncytial Virus by PCR: NEGATIVE
SARS Coronavirus 2 by RT PCR: NEGATIVE

## 2022-12-06 LAB — COMPREHENSIVE METABOLIC PANEL
ALT: 26 U/L (ref 0–44)
AST: 22 U/L (ref 15–41)
Albumin: 4 g/dL (ref 3.5–5.0)
Alkaline Phosphatase: 126 U/L (ref 38–126)
Anion gap: 13 (ref 5–15)
BUN: 15 mg/dL (ref 6–20)
CO2: 22 mmol/L (ref 22–32)
Calcium: 8.9 mg/dL (ref 8.9–10.3)
Chloride: 95 mmol/L — ABNORMAL LOW (ref 98–111)
Creatinine, Ser: 1.04 mg/dL (ref 0.61–1.24)
GFR, Estimated: >60 mL/min (ref >60)
Glucose, Bld: 126 mg/dL — ABNORMAL HIGH (ref 70–99)
Potassium: 3.7 mmol/L (ref 3.5–5.1)
Sodium: 130 mmol/L — ABNORMAL LOW (ref 135–145)
Total Bilirubin: 0.5 mg/dL (ref 0.3–1.2)
Total Protein: 7.1 g/dL (ref 6.5–8.1)

## 2022-12-06 LAB — CBC WITH DIFFERENTIAL/PLATELET
Abs Immature Granulocytes: 0.06 10*3/uL (ref 0.00–0.07)
Basophils Absolute: 0 10*3/uL (ref 0.0–0.1)
Basophils Relative: 0 %
Eosinophils Absolute: 0 10*3/uL (ref 0.0–0.5)
Eosinophils Relative: 0 %
HCT: 39 % (ref 39.0–52.0)
Hemoglobin: 12.9 g/dL — ABNORMAL LOW (ref 13.0–17.0)
Immature Granulocytes: 1 %
Lymphocytes Relative: 15 %
Lymphs Abs: 1.7 10*3/uL (ref 0.7–4.0)
MCH: 26.5 pg (ref 26.0–34.0)
MCHC: 33.1 g/dL (ref 30.0–36.0)
MCV: 80.1 fL (ref 80.0–100.0)
Monocytes Absolute: 0.7 10*3/uL (ref 0.1–1.0)
Monocytes Relative: 6 %
Neutro Abs: 8.8 10*3/uL — ABNORMAL HIGH (ref 1.7–7.7)
Neutrophils Relative %: 78 %
Platelets: 285 10*3/uL (ref 150–400)
RBC: 4.87 MIL/uL (ref 4.22–5.81)
RDW: 14.6 % (ref 11.5–15.5)
WBC: 11.3 10*3/uL — ABNORMAL HIGH (ref 4.0–10.5)
nRBC: 0 % (ref 0.0–0.2)

## 2022-12-06 LAB — CBG MONITORING, ED: Glucose-Capillary: 129 mg/dL — ABNORMAL HIGH (ref 70–99)

## 2022-12-06 LAB — ETHANOL: Alcohol, Ethyl (B): <10 mg/dL (ref <10)

## 2022-12-06 MED ORDER — IPRATROPIUM-ALBUTEROL 0.5-2.5 (3) MG/3ML IN SOLN
3.0000 mL | Freq: Once | RESPIRATORY_TRACT | Status: AC
  Administered 2022-12-06: 3 mL via RESPIRATORY_TRACT
  Filled 2022-12-06: qty 3

## 2022-12-06 MED ORDER — ALBUTEROL SULFATE HFA 108 (90 BASE) MCG/ACT IN AERS
2.0000 | INHALATION_SPRAY | Freq: Once | RESPIRATORY_TRACT | Status: AC
  Administered 2022-12-06: 2 via RESPIRATORY_TRACT
  Filled 2022-12-06: qty 6.7

## 2022-12-06 MED ORDER — HYDROCODONE-ACETAMINOPHEN 5-325 MG PO TABS
1.0000 | ORAL_TABLET | Freq: Once | ORAL | Status: AC
  Administered 2022-12-06: 1 via ORAL
  Filled 2022-12-06: qty 1

## 2022-12-06 MED ORDER — LACTATED RINGERS IV BOLUS
1000.0000 mL | Freq: Once | INTRAVENOUS | Status: AC
  Administered 2022-12-06: 1000 mL via INTRAVENOUS

## 2022-12-06 NOTE — Discharge Instructions (Addendum)
You are being given an albuterol inhaler to help with wheezing or cough.  You can use this 2 puffs every 4 hours as needed for cough, wheezing, shortness of breath.  Be sure to follow-up with your podiatrist in regards to your foot.  If you develop fever, new or worsening pain, or any other new/concerning symptoms and return to the ER or call 911.

## 2022-12-06 NOTE — ED Triage Notes (Signed)
Pt arrives by EMS coming from home. Pt reports pain to right foot since yesterday. Has diabetic ulcer to right 1st toe since September. Denies injury trauma to foot. Denies fevers.

## 2022-12-06 NOTE — ED Provider Notes (Signed)
[facility] Provider Note   CSN: [id number] Arrival date & time: 12/06/22  1746     History  Chief Complaint  Patient presents with   Foot Pain    Ruben Pierce is a 57 y.o. male.  HPI 57 year old male with history of alcohol abuse, TBI, type 2 diabetes, tobacco abuse, and a chronic right toe wound presents with right foot pain.  He has been dealing with a foot wound since September 2023 after going swimming.  However since yesterday he is having such severe pain in his foot that he cannot walk.  He states if absolutely necessary he can bear weight but is very painful.  He denies any fevers.  Yesterday he was walking and felt lightheaded and his sugar went down into the 60s.  Typically it is in the 190s.  He was able to get back up though he thinks he overshot.  However he does not think that he fell or injured himself.  He denies any chest pain but has been having shortness of breath and cough for the last 1 week or so. No weakness in RLE. 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  Home Medications Prior to Admission medications   Medication Sig Start Date End Date Taking? Authorizing Provider  acetaminophen (TYLENOL) 500 MG tablet Take 2 tablets (1,000 mg total) by mouth every 8 (eight) hours as needed. 08/29/22   Johny Blamer, DO  amitriptyline (ELAVIL) 100 MG tablet Take 1 tablet (100 mg total) by mouth at bedtime. 09/15/22   Johny Blamer, DO  amLODipine (NORVASC) 10 MG tablet Take 1 tablet (10 mg total) by mouth daily. 04/20/22 04/20/23  Aldine Contes, MD  aspirin EC 81 MG tablet Take 1 tablet (81 mg total) by mouth daily. Swallow whole. 11/30/22 08/27/23  Sanjuan Dame, MD  atorvastatin (LIPITOR) 80 MG tablet Take 1 tablet (80 mg total) by mouth daily. 05/11/22   Lacinda Axon, MD  diclofenac Sodium (VOLTAREN) 1 % GEL Apply 2 g topically 4 (four) times daily. 05/05/22   Lacinda Axon, MD  enalapril (VASOTEC) 10 MG tablet Take 1  tablet (10 mg total) by mouth daily. 06/01/22   Sanjuan Dame, MD  famotidine (PEPCID) 20 MG tablet Take 1 tablet (20 mg total) by mouth 2 (two) times daily. 08/29/22   Johny Blamer, DO  gentamicin ointment (GARAMYCIN) 0.1 % Apply 1 Application topically 3 (three) times daily. 08/29/22   Johny Blamer, DO  glucose blood (ACCU-CHEK AVIVA PLUS) test strip Use as instructed 07/06/22   Johny Blamer, DO  ibuprofen (ADVIL) 200 MG tablet Take 1 tablet (200 mg total) by mouth every 6 (six) hours as needed. 08/29/22   Johny Blamer, DO  INVOKANA 300 MG TABS tablet TAKE ONE TABLET BY MOUTH EVERY DAY BEFORE breakfast 09/11/22   Johny Blamer, DO  Lancets (FREESTYLE) lancets Use as instructed 12/05/21   Mitzi Hansen, MD  metFORMIN (GLUCOPHAGE) 1000 MG tablet Take 1 tablet (1,000 mg total) by mouth 2 (two) times daily with a meal. 07/06/22   Johny Blamer, DO  naltrexone (DEPADE) 50 MG tablet Take 50 mg by mouth daily. 02/08/22   [provider]  NICOTINE STEP 2 14 MG/24HR patch APPLY ONE PATCH onto THE SKIN EVERY DAY 11/28/22   Johny Blamer, DO  pantoprazole (PROTONIX) 40 MG tablet Take 1 tablet (40 mg total) by mouth daily. 04/20/22 04/20/23  Aldine Contes, MD  sildenafil (VIAGRA) 50 MG tablet Take 1 tablet (50 mg total) by  mouth as needed for erectile dysfunction. 09/15/20 09/15/21  Mitzi Hansen, MD  STIOLTO RESPIMAT 2.5-2.5 MCG/ACT AERS Inhale 2 puffs into the lungs daily. 12/06/22   Johny Blamer, DO  varenicline (CHANTIX) 1 MG tablet Take 1 tablet (1 mg total) by mouth 2 (two) times daily. 12/06/22   Johny Blamer, DO      Allergies    Patient has no known allergies.    Review of Systems   Review of Systems  Constitutional:  Negative for fever.  Respiratory:  Positive for cough and shortness of breath.   Cardiovascular:  Negative for chest pain and leg swelling.  Musculoskeletal:  Positive for arthralgias.  Skin:  Positive for wound.  Neurological:  Positive for  light-headedness. Negative for syncope.    Physical Exam Updated Vital Signs BP (!) 147/98   Pulse 96   Temp 98.2 F (36.8 C) (Oral)   Resp 18   Ht '6\' 1"'$  (1.854 m)   Wt 86.2 kg   SpO2 98%   BMI 25.07 kg/m  Physical Exam Vitals and nursing note reviewed.  Constitutional:      Appearance: He is well-developed.  HENT:     Head: Normocephalic and atraumatic.  Cardiovascular:     Rate and Rhythm: Regular rhythm. Tachycardia present.     Pulses:          Posterior tibial pulses are 2+ on the right side.     Heart sounds: Normal heart sounds.  Pulmonary:     Effort: Pulmonary effort is normal.     Breath sounds: Wheezing (diffuse, expiratory) present.  Abdominal:     Palpations: Abdomen is soft.     Tenderness: There is no abdominal tenderness.  Musculoskeletal:     Comments: See pictures. He has a chronic appearing wound without drainage. No significant tenderness in the foot  Skin:    General: Skin is warm and dry.  Neurological:     Mental Status: He is alert.     Comments: 5/5 strength in BLE.         ED Results / Procedures / Treatments   Labs (all labs ordered are listed, but only abnormal results are displayed) Labs Reviewed  COMPREHENSIVE METABOLIC PANEL - Abnormal; Notable for the following components:      Result Value   Sodium 130 (*)    Chloride 95 (*)    Glucose, Bld 126 (*)    All other components within normal limits  CBC WITH DIFFERENTIAL/PLATELET - Abnormal; Notable for the following components:   WBC 11.3 (*)    Hemoglobin 12.9 (*)    Neutro Abs 8.8 (*)    All other components within normal limits  CBG MONITORING, ED - Abnormal; Notable for the following components:   Glucose-Capillary 129 (*)    All other components within normal limits  RESP PANEL BY RT-PCR (RSV, FLU A&B, COVID)  [id number]  ETHANOL    EKG None  Radiology DG Foot Complete Right  Result Date: 12/06/2022 CLINICAL DATA:  Chronic wound.  Foot pain for 2 days. EXAM: RIGHT  FOOT COMPLETE - 3 VIEW COMPARISON:  X-ray 07/25/2022 and older.  Films only.  No report FINDINGS: Once again there is deformity involving the head of the first metatarsal with associated deformity of the base of the proximal phalanx with osteophyte formation and sclerosis. No acute fracture or dislocation. Preserved bone mineralization. No clear erosive changes at this time. Please correlate with location of wound. Soft tissue swelling in this location. If there  is concern specifically of bone infection, follow up bone scan or MRI can be performed for further sensitivity. IMPRESSION: Soft tissue swelling of the great toe. Advanced degenerative changes and deformity of the first metatarsophalangeal joint. Electronically Signed   By: Jill Side M.D.   On: 12/06/2022 18:59   DG Chest 2 View  Result Date: 12/06/2022 CLINICAL DATA:  Cough and wheezing EXAM: CHEST - 2 VIEW COMPARISON:  Chest x-ray 03/20/2018 comparison. Cervical spine CT 06/10/2022 FINDINGS: No pneumothorax, effusion or edema. Normal cardiopericardial silhouette. Degenerative changes of the spine. Slight hazy opacity at the right lung apex. Unchanged from prior. Likely chronic. Mild changes as well in the left lung apex. IMPRESSION: Chronic apical opacities.  No acute cardiopulmonary disease Electronically Signed   By: Jill Side M.D.   On: 12/06/2022 18:57    Procedures Procedures    Medications Ordered in ED Medications  lactated ringers bolus 1,000 mL (0 mLs Intravenous Stopped 12/06/22 2119)  ipratropium-albuterol (DUONEB) 0.5-2.5 (3) MG/3ML nebulizer solution 3 mL (3 mLs Nebulization Given 12/06/22 1944)  HYDROcodone-acetaminophen (NORCO/VICODIN) 5-325 MG per tablet 1 tablet (1 tablet Oral Given 12/06/22 1944)  albuterol (VENTOLIN HFA) 108 (90 Base) MCG/ACT inhaler 2 puff (2 puffs Inhalation Given 12/06/22 2137)    ED Course/ Medical Decision Making/ A&P                             Medical Decision Making Amount and/or Complexity of  Data Reviewed Labs: ordered.    Details: Slight leukocytosis.  No acidosis or hyperglycemia beyond minimal elevation. Radiology: ordered and independent interpretation performed.    Details: No pneumonia, no obvious osteomyelitis  Risk Prescription drug management.   Based on presentation today I think infection is unlikely in this toe.  Unclear why his foot is hurting worse today.  The osteomyelitis is less likely.  He just had a visit with his PCP and his foot picture from then looks similar to today.  Offered to start him on antibiotics but he prefers to just follow-up with his podiatrist.  Will give him a soft soled shoe to see if this helps with discomfort.  As for his cough, likely related to continued smoking and some bronchitis.  However with his diabetes I do not think steroids are warranted.  He was given a DuoNeb.  Will discharge home with return precautions but at this point I do not think he needs admission, MRI or IV antibiotics.        Final Clinical Impression(s) / ED Diagnoses Final diagnoses:  Right foot pain  Acute bronchitis, unspecified organism    Rx / DC Orders ED Discharge Orders     None         Sherwood Gambler, MD 12/06/22 2357

## 2022-12-06 NOTE — Progress Notes (Signed)
Orthopedic Tech Progress Note Patient Details:  Ruben Pierce [birth year] [id number]  Ortho Devices Type of Ortho Device: Postop shoe/boot Ortho Device/Splint Location: LLE Ortho Device/Splint Interventions: Ordered, Application, Adjustment   Post Interventions Patient Tolerated: Well Instructions Provided: Care of device  Janit Pagan 12/06/2022, 10:03 PM

## 2022-12-07 ENCOUNTER — Telehealth: Payer: Self-pay

## 2022-12-07 NOTE — Transitions of Care (Post Inpatient/ED Visit) (Signed)
   12/07/2022  Name: Ruben Pierce MRN: [id number] DOB: 1966-09-13  Today's TOC FU Call Status: Today's TOC FU Call Status:: Unsuccessul Call (1st Attempt) Unsuccessful Call (1st Attempt) Date: 12/07/22  Attempted to reach the patient regarding the most recent Inpatient/ED visit.  Follow Up Plan: Additional outreach attempts will be made to reach the patient to complete the Transitions of Care (Post Inpatient/ED visit) call.   Mickel Fuchs, BSW, [place] Managed Medicaid Team  [phone]

## 2022-12-08 ENCOUNTER — Telehealth: Payer: Self-pay

## 2022-12-08 NOTE — Transitions of Care (Post Inpatient/ED Visit) (Signed)
   12/08/2022  Name: Ruben Pierce MRN: [id number] DOB: September 21, 1966  Today's TOC FU Call Status: Today's TOC FU Call Status:: Unsuccessful Call (2nd Attempt)  Attempted to reach the patient regarding the most recent Inpatient/ED visit.  Follow Up Plan: Additional outreach attempts will be made to reach the patient to complete the Transitions of Care (Post Inpatient/ED visit) call.   Mickel Fuchs, BSW, [place] Managed Medicaid Team  [phone]

## 2022-12-12 ENCOUNTER — Telehealth: Payer: Self-pay | Admitting: *Deleted

## 2022-12-12 NOTE — Telephone Encounter (Signed)
Johny Blamer, DO  P Imp Triage [facility], can we call him or family to see if his toe is getting worse. If so then we will need him to see [place] or his podiatrist as soon as possible. Thank you!  I called pt who stated the toe is sore,no drainage but it has turned white in color. He's not sure if it's infected. Stated he prefers coming here rather than going to the podiatrist. First available appt given to pt - Thursday 3/14 @ 1415PM.

## 2022-12-13 NOTE — Progress Notes (Unsigned)
CC: ED follow up  HPI:  Ruben Pierce is a 57 y.o. male living with a history stated below and presents today for an ED follow up after being seen for right foot pain. Please see problem based assessment and plan for additional details.  Past Medical History:  Diagnosis Date   Asthma    Counseling regarding advanced directives and goals of care 12/01/2020   DNR/DNI MOST form filled out 12/01/20   ETOH abuse    Hypertension    TBI (traumatic brain injury) ([place])    Type 2 diabetes mellitus, uncontrolled 2010    Current Outpatient Medications on File Prior to Visit  Medication Sig Dispense Refill   acetaminophen (TYLENOL) 500 MG tablet Take 2 tablets (1,000 mg total) by mouth every 8 (eight) hours as needed. 180 tablet 2   amitriptyline (ELAVIL) 100 MG tablet Take 1 tablet (100 mg total) by mouth at bedtime. 90 tablet 1   amLODipine (NORVASC) 10 MG tablet Take 1 tablet (10 mg total) by mouth daily. 30 tablet 11   aspirin EC 81 MG tablet Take 1 tablet (81 mg total) by mouth daily. Swallow whole. 90 tablet 2   atorvastatin (LIPITOR) 80 MG tablet Take 1 tablet (80 mg total) by mouth daily. 90 tablet 2   diclofenac Sodium (VOLTAREN) 1 % GEL Apply 2 g topically 4 (four) times daily. 100 g 1   enalapril (VASOTEC) 10 MG tablet Take 1 tablet (10 mg total) by mouth daily. 90 tablet 3   famotidine (PEPCID) 20 MG tablet Take 1 tablet (20 mg total) by mouth 2 (two) times daily. 180 tablet 3   gentamicin ointment (GARAMYCIN) 0.1 % Apply 1 Application topically 3 (three) times daily. 15 g 0   glucose blood (ACCU-CHEK AVIVA PLUS) test strip Use as instructed 100 each 12   ibuprofen (ADVIL) 200 MG tablet Take 1 tablet (200 mg total) by mouth every 6 (six) hours as needed. 90 tablet 2   INVOKANA 300 MG TABS tablet TAKE ONE TABLET BY MOUTH EVERY DAY BEFORE breakfast 90 tablet 1   Lancets (FREESTYLE) lancets Use as instructed 100 each 12   metFORMIN (GLUCOPHAGE) 1000 MG tablet Take 1 tablet (1,000 mg  total) by mouth 2 (two) times daily with a meal. 360 tablet 2   naltrexone (DEPADE) 50 MG tablet Take 50 mg by mouth daily.     NICOTINE STEP 2 14 MG/24HR patch APPLY ONE PATCH onto THE SKIN EVERY DAY 28 patch 3   pantoprazole (PROTONIX) 40 MG tablet Take 1 tablet (40 mg total) by mouth daily. 30 tablet 11   sildenafil (VIAGRA) 50 MG tablet Take 1 tablet (50 mg total) by mouth as needed for erectile dysfunction. 20 tablet 1   STIOLTO RESPIMAT 2.5-2.5 MCG/ACT AERS Inhale 2 puffs into the lungs daily. 4 g 1   varenicline (CHANTIX) 1 MG tablet Take 1 tablet (1 mg total) by mouth 2 (two) times daily. 180 tablet 0   No current facility-administered medications on file prior to visit.    Family History  Problem Relation Age of Onset   CAD Mother    Diabetes Mellitus II Father    Hypertension Father    Hyperlipidemia Father    Colon cancer Neg Hx    Esophageal cancer Neg Hx    Rectal cancer Neg Hx    Stomach cancer Neg Hx     Social History   Socioeconomic History   Marital status: Single    Spouse name: Not  on file   Number of children: Not on file   Years of education: 9th grade   Highest education level: Not on file  Occupational History   Occupation: diabled  Tobacco Use   Smoking status: Every Day    Packs/day: 0.10    Types: Cigarettes   Smokeless tobacco: Never   Tobacco comments:    2 cigs per day  Vaping Use   Vaping Use: Never used  Substance and Sexual Activity   Alcohol use: Yes    Alcohol/week: 0.0 standard drinks of alcohol    Comment: Beer.; 12 cans a day.   Drug use: Yes    Types: Marijuana    Comment: Rarely.   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  Now is cared for by his Sister.   Social Determinants of Health   Financial Resource Strain: Not on file  Food Insecurity: Not on file   Transportation Needs: Not on file  Physical Activity: Not on file  Stress: Not on file  Social Connections: Not on file  Intimate Partner Violence: Not on file    Review of Systems: ROS negative except for what is noted on the assessment and plan.  There were no vitals filed for this visit.  Physical Exam: Constitutional: well-appearing *** sitting in ***, in no acute distress HENT: normocephalic atraumatic, mucous membranes moist Eyes: conjunctiva non-erythematous Cardiovascular: regular rate and rhythm, no m/r/g Pulmonary/Chest: normal work of breathing on room air, lungs clear to auscultation bilaterally Abdominal: soft, non-tender, non-distended MSK: normal bulk and tone Neurological: alert & oriented x 3, no focal deficit Skin: warm and dry Psych: normal mood and behavior  Assessment & Plan:   R foot pain: did notlook like osteo, f/u w podiatry? Declined abx  Patient {GC/[id number]::"discussed with","seen with"} Dr. [id number]. Hoffman","Mullen","Narendra","Vincent","Guilloud","Lau","Machen"}  No problem-specific Assessment & Plan notes found for this encounter.   Buddy Duty, D.O. [facility], PGY-2 Phone: [phone] Date 12/13/2022 Time 4:45 PM

## 2022-12-14 ENCOUNTER — Ambulatory Visit (INDEPENDENT_AMBULATORY_CARE_PROVIDER_SITE_OTHER): Payer: Medicaid Other | Admitting: Student

## 2022-12-14 VITALS — BP 132/79 | HR 100 | Temp 98.2°F | Ht 73.0 in | Wt 189.4 lb

## 2022-12-14 DIAGNOSIS — L97519 Non-pressure chronic ulcer of other part of right foot with unspecified severity: Secondary | ICD-10-CM

## 2022-12-14 DIAGNOSIS — L97511 Non-pressure chronic ulcer of other part of right foot limited to breakdown of skin: Secondary | ICD-10-CM

## 2022-12-14 MED ORDER — VARENICLINE TARTRATE 1 MG PO TABS: 1.0000 mg | ORAL_TABLET | Freq: Two times a day (BID) | ORAL | 0 refills | Status: DC

## 2022-12-14 NOTE — Progress Notes (Signed)
CC: chronic right toe ulcer  HPI:  [name] is a 57 y.o. male living with a history stated below and presents today for chronic right toe ulcer. Please see problem based assessment and plan for additional details.  Past Medical History:  Diagnosis Date   Asthma    Counseling regarding advanced directives and goals of care 12/01/2020   DNR/DNI MOST form filled out 12/01/20   ETOH abuse    Hypertension    TBI (traumatic brain injury) ([address])    Type 2 diabetes mellitus, uncontrolled 2010    Current Outpatient Medications on File Prior to Visit  Medication Sig Dispense Refill   acetaminophen (TYLENOL) 500 MG tablet Take 2 tablets (1,000 mg total) by mouth every 8 (eight) hours as needed. 180 tablet 2   amitriptyline (ELAVIL) 100 MG tablet Take 1 tablet (100 mg total) by mouth at bedtime. 90 tablet 1   amLODipine (NORVASC) 10 MG tablet Take 1 tablet (10 mg total) by mouth daily. 30 tablet 11   aspirin EC 81 MG tablet Take 1 tablet (81 mg total) by mouth daily. Swallow whole. 90 tablet 2   atorvastatin (LIPITOR) 80 MG tablet Take 1 tablet (80 mg total) by mouth daily. 90 tablet 2   diclofenac Sodium (VOLTAREN) 1 % GEL Apply 2 g topically 4 (four) times daily. 100 g 1   enalapril (VASOTEC) 10 MG tablet Take 1 tablet (10 mg total) by mouth daily. 90 tablet 3   famotidine (PEPCID) 20 MG tablet Take 1 tablet (20 mg total) by mouth 2 (two) times daily. 180 tablet 3   gentamicin ointment (GARAMYCIN) 0.1 % Apply 1 Application topically 3 (three) times daily. 15 g 0   glucose blood (ACCU-CHEK AVIVA PLUS) test strip Use as instructed 100 each 12   ibuprofen (ADVIL) 200 MG tablet Take 1 tablet (200 mg total) by mouth every 6 (six) hours as needed. 90 tablet 2   INVOKANA 300 MG TABS tablet TAKE ONE TABLET BY MOUTH EVERY DAY BEFORE breakfast 90 tablet 1   Lancets (FREESTYLE) lancets Use as instructed 100 each 12   metFORMIN (GLUCOPHAGE) 1000 MG tablet Take 1 tablet (1,000 mg total) by mouth 2  (two) times daily with a meal. 360 tablet 2   naltrexone (DEPADE) 50 MG tablet Take 50 mg by mouth daily.     NICOTINE STEP 2 14 MG/24HR patch APPLY ONE PATCH onto THE SKIN EVERY DAY 28 patch 3   pantoprazole (PROTONIX) 40 MG tablet Take 1 tablet (40 mg total) by mouth daily. 30 tablet 11   sildenafil (VIAGRA) 50 MG tablet Take 1 tablet (50 mg total) by mouth as needed for erectile dysfunction. 20 tablet 1   STIOLTO RESPIMAT 2.5-2.5 MCG/ACT AERS Inhale 2 puffs into the lungs daily. 4 g 1   No current facility-administered medications on file prior to visit.    Review of Systems: ROS negative except for what is noted on the assessment and plan.  Vitals:   12/14/22 0953  BP: 132/79  Pulse: 100  Temp: 98.2 F (36.8 C)  TempSrc: Oral  SpO2: 98%  Weight: 189 lb 6.4 oz (85.9 kg)  Height: '6\' 1"'$  (1.854 m)    Physical Exam: Constitutional: well-appearing, in no acute distress HENT: normocephalic atraumatic Eyes: conjunctiva non-erythematous Neck: supple Pulmonary/Chest: normal work of breathing on room air Abdominal: soft, non-tender, non-distended MSK: normal bulk and tone. Ulceration to tip of right great toe and lateral portion. Minimal erythema of the toe. Non-tender. Normal  sensation. Faint DP pulse.  Neurological: alert & oriented x 3, Psych: normal mood  Assessment & Plan:   Chronic ulcer of right great toe (HCC) Assessment: Chronic right toe ulcer for the past few years. Has not followed with podiatry regularly. He presented to the ED last week due to persistent pain. Xray with soft tissue and degenerative changes of the toe. Has never had an MRI before. Denies the pain being worse than his baseline pain.   Today his has minimal erythema of the toe without tenderness to palpation. No area to probe. Normal sensation throughout. Distal DP pulses  Overall clinical picture is concerning for chronic osteomyelitis. We have gotten him a podiatry appointment scheduled for 3/19 at  9:15 am. Will also order an MRI to further evaluate these bony changes. Do not believe needs antibiotics at this time nor further blood work with ESR/CRP since we obtaining the MRI  Plan: - follow up podiatry - MRI ordered - ABI's ordered  Patient discussed with Dr. Butch Penny, D.O. [facility], PGY-3 Phone: [phone] Date 12/14/2022 Time 10:47 AM

## 2022-12-14 NOTE — Patient Instructions (Signed)
Thank you, [name] for allowing [place] to provide your care today. Today we discussed.  Great Toe Ulcer We have scheduled you an appointment with podiatry on 3/19 at 9:15 am. Please make this appointment. We will also get you an MRI scheduled    Any big changes in the foot (more pain, redness, swelling, drainage) call us immediately   I have ordered the following labs for you:  Lab Orders  No laboratory test(s) ordered today      Referrals ordered today:    Referral Orders         Ambulatory referral to Podiatry      I have ordered the following medication/changed the following medications:   Stop the following medications: Medications Discontinued During This Encounter  Medication Reason   varenicline (CHANTIX) 1 MG tablet Reorder     Start the following medications: Meds ordered this encounter  Medications   varenicline (CHANTIX) 1 MG tablet    Sig: Take 1 tablet (1 mg total) by mouth 2 (two) times daily.    Dispense:  180 tablet    Refill:  0     Follow up:  4 weeks     Should you have any questions or concerns please call the internal medicine clinic at [phone].    Sanjuana Letters, D.O. [facility]

## 2022-12-14 NOTE — Addendum Note (Signed)
Addended by: Riesa Pope on: 12/14/2022 10:54 AM   Modules accepted: Orders

## 2022-12-14 NOTE — Assessment & Plan Note (Signed)
Assessment: Chronic right toe ulcer for the past few years. Has not followed with podiatry regularly. He presented to the ED last week due to persistent pain. Xray with soft tissue and degenerative changes of the toe. Has never had an MRI before. Denies the pain being worse than his baseline pain.   Today his has minimal erythema of the toe without tenderness to palpation. No area to probe. Normal sensation throughout. Distal DP pulses  Overall clinical picture is concerning for chronic osteomyelitis. We have gotten him a podiatry appointment scheduled for 3/19 at 9:15 am. Will also order an MRI to further evaluate these bony changes. Do not believe needs antibiotics at this time nor further blood work with ESR/CRP since we obtaining the MRI  Plan: - follow up podiatry - MRI ordered - ABI's ordered

## 2022-12-15 NOTE — Progress Notes (Signed)
Internal Medicine Clinic Attending  Case discussed with Dr. Katsadouros  At the time of the visit.  We reviewed the resident's history and exam and pertinent patient test results.  I agree with the assessment, diagnosis, and plan of care documented in the resident's note.  

## 2022-12-19 ENCOUNTER — Ambulatory Visit (INDEPENDENT_AMBULATORY_CARE_PROVIDER_SITE_OTHER): Payer: Medicaid Other | Admitting: Podiatry

## 2022-12-19 DIAGNOSIS — L97511 Non-pressure chronic ulcer of other part of right foot limited to breakdown of skin: Secondary | ICD-10-CM

## 2022-12-19 MED ORDER — MUPIROCIN 2 % EX OINT: 1.0000 | TOPICAL_OINTMENT | Freq: Every day | CUTANEOUS | 2 refills | Status: DC

## 2022-12-20 NOTE — Progress Notes (Signed)
  Subjective:  Patient ID: Ruben Pierce, male    DOB: [birth year],  MRN: [id number]  Chief Complaint  Patient presents with   Diabetic Ulcer    right foot wound/blister - scraped his right great toe while tubing last September - Has callus layer over end of toe    57 y.o. male presents with the above complaint. History confirmed with patient.  Has never fully healed over, has not noticed any drainage redness or swelling  Objective:  Physical Exam: warm, good capillary refill, normal DP and PT pulses, and ulceration at right hallux limited breakdown of skin with significant hyperkeratosis no active drainage no erythema cellulitis or purulence.   Radiographs: Multiple views x-ray of right foot taken 12/06/2022 show no evidence of osteomyelitis Assessment:   1. Skin ulcer of right great toe, limited to breakdown of skin [facility])      Plan:  Patient was evaluated and treated and all questions answered.  Appears to be healing well.  Do not see any evidence of infection.  He has an MRI ordered, I recommend local wound care with mupirocin ointment and Rx for this was sent to pharmacy to dress daily.  The hyperkeratosis was debrided safely with a #312 blade to a tolerable level.  Will see him back in 4 weeks for follow-up  Return in about 4 weeks (around 01/16/2023) for wound care.
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
## 2022-12-22 NOTE — Addendum Note (Signed)
Addended by: Riesa Pope on: 12/22/2022 08:54 AM   Modules accepted: Orders

## 2022-12-22 NOTE — Addendum Note (Signed)
Addended by: Riesa Pope on: 12/22/2022 08:21 AM   Modules accepted: Orders

## 2022-12-26 ENCOUNTER — Other Ambulatory Visit: Payer: Self-pay | Admitting: Internal Medicine

## 2022-12-26 DIAGNOSIS — L97511 Non-pressure chronic ulcer of other part of right foot limited to breakdown of skin: Secondary | ICD-10-CM

## 2022-12-26 DIAGNOSIS — S60552A Superficial foreign body of left hand, initial encounter: Secondary | ICD-10-CM

## 2022-12-26 NOTE — Addendum Note (Signed)
Addended by: Riesa Pope on: 12/26/2022 01:33 PM   Modules accepted: Orders

## 2023-01-01 DIAGNOSIS — Z419 Encounter for procedure for purposes other than remedying health state, unspecified: Secondary | ICD-10-CM | POA: Diagnosis not present

## 2023-01-08 ENCOUNTER — Encounter: Payer: Self-pay | Admitting: Internal Medicine

## 2023-01-12 ENCOUNTER — Inpatient Hospital Stay: Admission: RE | Admit: 2023-01-12 | Payer: Medicaid Other | Source: Ambulatory Visit

## 2023-01-16 ENCOUNTER — Other Ambulatory Visit: Payer: Self-pay | Admitting: Student

## 2023-01-16 DIAGNOSIS — E119 Type 2 diabetes mellitus without complications: Secondary | ICD-10-CM

## 2023-01-16 DIAGNOSIS — E1169 Type 2 diabetes mellitus with other specified complication: Secondary | ICD-10-CM

## 2023-01-17 ENCOUNTER — Ambulatory Visit (INDEPENDENT_AMBULATORY_CARE_PROVIDER_SITE_OTHER): Payer: Medicaid Other | Admitting: Podiatry

## 2023-01-17 DIAGNOSIS — Z91199 Patient's noncompliance with other medical treatment and regimen due to unspecified reason: Secondary | ICD-10-CM

## 2023-01-20 NOTE — Progress Notes (Signed)
Patient was no-show for appointment today 

## 2023-01-29 ENCOUNTER — Other Ambulatory Visit: Payer: Self-pay | Admitting: Student

## 2023-01-29 ENCOUNTER — Telehealth: Payer: Self-pay

## 2023-01-29 DIAGNOSIS — J449 Chronic obstructive pulmonary disease, unspecified: Secondary | ICD-10-CM

## 2023-01-29 NOTE — Telephone Encounter (Signed)
Ruben Pierce (Key: [id number]) PA Case ID #: [id number] Rx #: [id number] Need Help? Call us at [phone] Outcome Denied today Denied-Partial. The requested quantity of more than 180 days supply per 365 days is more than the health plan's limit. Per the health plan guideline, Quantity Limits, the limit for this drug is 180 days supply per 365 days. The member does meet the drug criteria. Therefore, this request has been approved with the quantity allowed by the health plan. Authorization Expiration Date: 01/29/2023 Drug Varenicline Tartrate 1MG  tablets ePA cloud logo Form [facility] Medicaid of [facility] Electronic Prior Authorization Request Form [id number] NCPDP) Original Claim Info 42 Submit 3 DS For Emerg Fill with PA Type01, PA Number 1111, Level of Service 3PLAN LIMIT EXCEEDED For RxLocal Coupon Price of: $983.86 submit to BIN: [id number] PCN: CP Group: [id number] --Service provided at no cost and no switch fee to the pharmacy--

## 2023-01-29 NOTE — Telephone Encounter (Signed)
Prior Authorization for patient (Varencline Tartrate) came through on cover my meds was submitted with last office notes and labs awaiting approval or denial

## 2023-01-30 NOTE — Telephone Encounter (Signed)
DECISION ON YOUR REQUEST FOR SERVICES Notice Date: 01/29/2023 PA #: [id number] This Action will take effect on: 01/29/2023 Call [phone] for help Ruben Pierce [address] [place], [place] [address] Ruben Pierce [address] [id number] [place], [place] [address] MID: [id number] Ruben Pierce [id number] Member: Ruben Pierce DOB: 07-13-1966 [facility] your [place] services. On 01/29/2023, you or your provider asked  [place] to approve your request for healthcare services or items.  WE DENIED SOME OF YOUR REQUEST FOR PHARMACY SERVICES. IF YOU DO NOT AGREE WITH OUR DECISION, YOU CAN APPEAL IT. This letter tells you about our decision. Please read it carefully. You can ask for an Appeal by mail, by fax, by phone or in-person. There are instructions in this Notice that  will tell you what to do. Please read them carefully.  The last day to ask for an Appeal is 03/30/2023. You have 60 calendar days from the date on this Notice  to ask for an Appeal. If you need help filing your appeal, call us at [phone]). Name: Ruben Pierce MID: [id number] L Decision Date: 01/29/2023 YOU ASKED FOR: Service Description Code 1 Code 2 Plan Requested  Dates Requested  Amount VARENICLINE TARTRATE Tablet [id number] Medicaid 01/29/2023 57 WE APPROVED: Service Description Code 1 Code 2 Plan Approved  Dates Approved  Amount  VARENICLINE TARTRATE Tablet [id number] Medicaid 01/29/2023 0 WE DENIED: Service Description Code 1 Code 2 Plan Denied Dates Denied  Amount VARENICLINE TARTRATE Tablet [id number] Medicaid 01/29/2023 57 COMMENTS:  The requested quantity of more than 180 days supply per 365 days is more than the health plan's limit. Per  the health plan guideline, Quantity Limits, the limit for this drug is 180 days supply per 365 days. The  member does meet the drug criteria. Therefore, this request has been approved with the quantity allowed by the health  plan. Authority Supporting Decision: We base our decision to approve or deny a request for Medicaid services on: ? Policies found on our website at: https://[email or web address]/en/[email or web address]/Providers/ClinicalGuidelines ? Medicaid Clinical Coverage Policies found at: https://medicaid.https://www.frey.org/ ? 10A NCAC 25A .0201: MEDICAL SERVICES All medical services performed must be medically necessary  and may not be experimental in nature. Medical necessity is determined by generally accepted [employer] as verified by independent [employer]. ? The [facility] Plan for Medical Assistance, found at: https://medicaid.BetaBros.nl  If you want [place] to send you a free copy of these documents, please call [phone] (TTY 711). We will  mail the documents to you before we finish your appeal. We can give you a free written copy of the full clinical rationale, rules or standards that we used and  information we generated when we made this decision. If you want a free copy, call us at: [phone] (TTY 711). You also have the right to see your entire case file. Your case file includes all your medical records, other  documents and records. It may have more information about why your health care service was changed or  Name: Ruben Pierce MID: [id number] L Decision Date: 01/29/2023 not approved. To arrange to see your file, call [phone]. If you say you want a copy of your entire  case file in connection with the decision, we will give you or your authorized representative a free copy  before we finish your appeal.

## 2023-01-31 DIAGNOSIS — Z419 Encounter for procedure for purposes other than remedying health state, unspecified: Secondary | ICD-10-CM | POA: Diagnosis not present

## 2023-02-05 ENCOUNTER — Ambulatory Visit (HOSPITAL_COMMUNITY): Admission: RE | Admit: 2023-02-05 | Payer: Medicaid Other | Source: Ambulatory Visit

## 2023-02-13 ENCOUNTER — Ambulatory Visit: Payer: Medicaid Other | Admitting: Podiatry

## 2023-03-03 DIAGNOSIS — Z419 Encounter for procedure for purposes other than remedying health state, unspecified: Secondary | ICD-10-CM | POA: Diagnosis not present

## 2023-03-05 ENCOUNTER — Other Ambulatory Visit: Payer: Self-pay | Admitting: Student

## 2023-03-05 DIAGNOSIS — E119 Type 2 diabetes mellitus without complications: Secondary | ICD-10-CM

## 2023-03-12 ENCOUNTER — Other Ambulatory Visit: Payer: Self-pay | Admitting: Student

## 2023-03-12 DIAGNOSIS — G4700 Insomnia, unspecified: Secondary | ICD-10-CM

## 2023-03-29 ENCOUNTER — Encounter: Payer: Medicaid Other | Admitting: Student

## 2023-04-02 DIAGNOSIS — Z419 Encounter for procedure for purposes other than remedying health state, unspecified: Secondary | ICD-10-CM | POA: Diagnosis not present

## 2023-04-03 ENCOUNTER — Other Ambulatory Visit: Payer: Self-pay | Admitting: Student

## 2023-04-03 ENCOUNTER — Other Ambulatory Visit: Payer: Self-pay | Admitting: Podiatry

## 2023-04-03 DIAGNOSIS — J449 Chronic obstructive pulmonary disease, unspecified: Secondary | ICD-10-CM

## 2023-04-03 DIAGNOSIS — M79604 Pain in right leg: Secondary | ICD-10-CM

## 2023-04-21 ENCOUNTER — Other Ambulatory Visit: Payer: Self-pay | Admitting: Internal Medicine

## 2023-04-21 DIAGNOSIS — K219 Gastro-esophageal reflux disease without esophagitis: Secondary | ICD-10-CM

## 2023-04-21 DIAGNOSIS — I152 Hypertension secondary to endocrine disorders: Secondary | ICD-10-CM

## 2023-04-25 ENCOUNTER — Other Ambulatory Visit: Payer: Self-pay | Admitting: Student

## 2023-04-25 DIAGNOSIS — M79604 Pain in right leg: Secondary | ICD-10-CM

## 2023-05-03 ENCOUNTER — Encounter: Payer: Self-pay | Admitting: Student

## 2023-05-03 ENCOUNTER — Ambulatory Visit: Payer: Medicaid Other | Admitting: Student

## 2023-05-03 VITALS — BP 119/68 | HR 99 | Temp 97.8°F | Wt 186.6 lb

## 2023-05-03 DIAGNOSIS — F17210 Nicotine dependence, cigarettes, uncomplicated: Secondary | ICD-10-CM

## 2023-05-03 DIAGNOSIS — E1159 Type 2 diabetes mellitus with other circulatory complications: Secondary | ICD-10-CM

## 2023-05-03 DIAGNOSIS — I152 Hypertension secondary to endocrine disorders: Secondary | ICD-10-CM | POA: Diagnosis not present

## 2023-05-03 DIAGNOSIS — Z720 Tobacco use: Secondary | ICD-10-CM

## 2023-05-03 DIAGNOSIS — E119 Type 2 diabetes mellitus without complications: Secondary | ICD-10-CM | POA: Diagnosis not present

## 2023-05-03 DIAGNOSIS — F1090 Alcohol use, unspecified, uncomplicated: Secondary | ICD-10-CM | POA: Diagnosis not present

## 2023-05-03 DIAGNOSIS — Z419 Encounter for procedure for purposes other than remedying health state, unspecified: Secondary | ICD-10-CM | POA: Diagnosis not present

## 2023-05-03 DIAGNOSIS — F17200 Nicotine dependence, unspecified, uncomplicated: Secondary | ICD-10-CM

## 2023-05-03 LAB — POCT GLYCOSYLATED HEMOGLOBIN (HGB A1C): Hemoglobin A1C: 6.7 % — AB (ref 4.0–5.6)

## 2023-05-03 LAB — GLUCOSE, CAPILLARY: Glucose-Capillary: 183 mg/dL — ABNORMAL HIGH (ref 70–99)

## 2023-05-03 MED ORDER — NALTREXONE HCL 50 MG PO TABS
50.0000 mg | ORAL_TABLET | Freq: Every day | ORAL | 3 refills | Status: DC
Start: 2023-05-03 — End: 2023-12-19

## 2023-05-03 MED ORDER — VARENICLINE TARTRATE 1 MG PO TABS
1.0000 mg | ORAL_TABLET | Freq: Two times a day (BID) | ORAL | 0 refills | Status: DC
Start: 2023-05-03 — End: 2023-12-15

## 2023-05-03 NOTE — Assessment & Plan Note (Addendum)
Patient endorses drinking a 12 pack of beer daily for the past year. He has never experienced withdrawal symptoms in the past and is able to tolerate days without alcohol. He is adamant about his desire to quit at this time. Patient counseled on withdrawal symptoms.   -Naltrexone 50 mg/daily ordered. -CMP and CBC ordered to calculate FIB-4
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
## 2023-05-03 NOTE — Assessment & Plan Note (Signed)
Patient has a history of T2DM managed with Metformin 1000 mg/BID and Invokana 300 mg/daily. A1c today is 6.7 which improved from 7.9 in 11/2022. -Continue Metformin and Invokana

## 2023-05-03 NOTE — Patient Instructions (Addendum)
Thank you for allowing me to be a part of your care team. Today we discussed a few things: I will re-prescribe Naltrexone and Chantix to help with your alcohol and tobacco cravings Keep up the good work with your A1c. I would like to see it even lower than 6.7 next time.  Please give me a call if you have any questions

## 2023-05-03 NOTE — Assessment & Plan Note (Signed)
Patient has a history of hypertension managed with Amlodipine 10 mg/daily and Enalapril 10 mg/daily. BP is 119/68 today. -Continue Amlodipine and Enalapril

## 2023-05-03 NOTE — Assessment & Plan Note (Addendum)
Patient smokes approximately 0.5 ppd currently and has a 30 pack-year history. Patient previously prescribed Chantix but states that at that time he was not ready to quit. Patient states that now he is ready. -Chantix 1 mg/BID ordered

## 2023-05-04 NOTE — Progress Notes (Signed)
Internal Medicine Attending:  I was physically present during the critical or key portions of the resident provided service and participated in formulating the plan of care and medical decision making for the patient as documented in the resident's note.  Tyson Alias, MD

## 2023-05-04 NOTE — Addendum Note (Signed)
Addended by: Erlinda Hong T on: 05/04/2023 08:13 AM   Modules accepted: Level of Service

## 2023-05-15 ENCOUNTER — Encounter: Payer: Medicaid Other | Admitting: Student

## 2023-05-21 ENCOUNTER — Other Ambulatory Visit: Payer: Self-pay | Admitting: Student

## 2023-05-21 DIAGNOSIS — M79604 Pain in right leg: Secondary | ICD-10-CM

## 2023-06-03 DIAGNOSIS — Z419 Encounter for procedure for purposes other than remedying health state, unspecified: Secondary | ICD-10-CM | POA: Diagnosis not present

## 2023-06-06 ENCOUNTER — Other Ambulatory Visit: Payer: Self-pay | Admitting: Student

## 2023-06-06 ENCOUNTER — Telehealth: Payer: Self-pay

## 2023-06-06 DIAGNOSIS — J449 Chronic obstructive pulmonary disease, unspecified: Secondary | ICD-10-CM

## 2023-06-06 DIAGNOSIS — M79604 Pain in right leg: Secondary | ICD-10-CM

## 2023-06-06 NOTE — Telephone Encounter (Signed)
Decision:Approved Satvik Sharman (KeyEstill Dooms) PA Case ID #: [id number] Rx #: [id number] Need Help? Call us at [phone] Outcome Approved today by [facility] Medicaid 2017 Approved. This drug has been approved. Approved quantity: 90 tablets per 90 day(s). You may fill up to a 34 day supply at a retail pharmacy. You may fill up to a 90 day supply for maintenance drugs, please refer to the formulary for details. Please call the pharmacy to process your prescription claim. Authorization Expiration Date: 06/05/2024 Drug Invokana 300MG  tablets ePA cloud logo Form [facility] Medicaid of [employer] Electronic Prior Authorization Request Form [phone] NCPDP) Original Claim Info 75 Submit 3 DS For Emerg Fill with PA Type01, PA Number 1111, Level of Service 3

## 2023-06-06 NOTE — Telephone Encounter (Signed)
Prior Authorization for patient Ruben Pierce) came  through on cover my meds was submitted with last office notes and labs awaiting approval or denial.  WUJ:[id number]

## 2023-06-15 ENCOUNTER — Other Ambulatory Visit: Payer: Self-pay

## 2023-06-15 DIAGNOSIS — E1159 Type 2 diabetes mellitus with other circulatory complications: Secondary | ICD-10-CM

## 2023-06-15 MED ORDER — ENALAPRIL MALEATE 10 MG PO TABS
10.0000 mg | ORAL_TABLET | Freq: Every day | ORAL | 3 refills | Status: DC
Start: 2023-06-15 — End: 2024-02-23

## 2023-06-20 ENCOUNTER — Other Ambulatory Visit: Payer: Self-pay | Admitting: Student

## 2023-06-20 DIAGNOSIS — M79604 Pain in right leg: Secondary | ICD-10-CM

## 2023-07-03 DIAGNOSIS — Z419 Encounter for procedure for purposes other than remedying health state, unspecified: Secondary | ICD-10-CM | POA: Diagnosis not present

## 2023-07-05 ENCOUNTER — Other Ambulatory Visit: Payer: Self-pay | Admitting: Student

## 2023-07-05 DIAGNOSIS — M79604 Pain in right leg: Secondary | ICD-10-CM

## 2023-07-09 ENCOUNTER — Other Ambulatory Visit: Payer: Self-pay | Admitting: Podiatry

## 2023-07-09 ENCOUNTER — Other Ambulatory Visit: Payer: Self-pay | Admitting: Student

## 2023-07-09 DIAGNOSIS — E119 Type 2 diabetes mellitus without complications: Secondary | ICD-10-CM

## 2023-07-12 ENCOUNTER — Other Ambulatory Visit: Payer: Self-pay

## 2023-07-12 ENCOUNTER — Emergency Department (HOSPITAL_COMMUNITY)
Admission: EM | Admit: 2023-07-12 | Discharge: 2023-07-12 | Disposition: A | Discharge status: Home / Self Care | Payer: Medicaid Other | Attending: Emergency Medicine | Admitting: Emergency Medicine

## 2023-07-12 ENCOUNTER — Encounter (HOSPITAL_COMMUNITY): Payer: Self-pay

## 2023-07-12 ENCOUNTER — Emergency Department (HOSPITAL_COMMUNITY): Payer: Medicaid Other

## 2023-07-12 DIAGNOSIS — Z79899 Other long term (current) drug therapy: Secondary | ICD-10-CM | POA: Insufficient documentation

## 2023-07-12 DIAGNOSIS — Z7982 Long term (current) use of aspirin: Secondary | ICD-10-CM | POA: Diagnosis not present

## 2023-07-12 DIAGNOSIS — Z7984 Long term (current) use of oral hypoglycemic drugs: Secondary | ICD-10-CM | POA: Insufficient documentation

## 2023-07-12 DIAGNOSIS — J45909 Unspecified asthma, uncomplicated: Secondary | ICD-10-CM | POA: Insufficient documentation

## 2023-07-12 DIAGNOSIS — R42 Dizziness and giddiness: Secondary | ICD-10-CM | POA: Diagnosis not present

## 2023-07-12 DIAGNOSIS — Z7951 Long term (current) use of inhaled steroids: Secondary | ICD-10-CM | POA: Diagnosis not present

## 2023-07-12 DIAGNOSIS — R55 Syncope and collapse: Secondary | ICD-10-CM | POA: Insufficient documentation

## 2023-07-12 DIAGNOSIS — I10 Essential (primary) hypertension: Secondary | ICD-10-CM | POA: Diagnosis not present

## 2023-07-12 DIAGNOSIS — E119 Type 2 diabetes mellitus without complications: Secondary | ICD-10-CM | POA: Insufficient documentation

## 2023-07-12 LAB — CBC
HCT: 40.8 % (ref 39.0–52.0)
Hemoglobin: 13.8 g/dL (ref 13.0–17.0)
MCH: 27.9 pg (ref 26.0–34.0)
MCHC: 33.8 g/dL (ref 30.0–36.0)
MCV: 82.6 fL (ref 80.0–100.0)
Platelets: 274 10*3/uL (ref 150–400)
RBC: 4.94 MIL/uL (ref 4.22–5.81)
RDW: 16 % — ABNORMAL HIGH (ref 11.5–15.5)
WBC: 8 10*3/uL (ref 4.0–10.5)
nRBC: 0 % (ref 0.0–0.2)

## 2023-07-12 LAB — BASIC METABOLIC PANEL
Anion gap: 13 (ref 5–15)
BUN: 18 mg/dL (ref 6–20)
CO2: 24 mmol/L (ref 22–32)
Calcium: 9.8 mg/dL (ref 8.9–10.3)
Chloride: 100 mmol/L (ref 98–111)
Creatinine, Ser: 1.51 mg/dL — ABNORMAL HIGH (ref 0.61–1.24)
GFR, Estimated: 54 mL/min — ABNORMAL LOW (ref >60)
Glucose, Bld: 103 mg/dL — ABNORMAL HIGH (ref 70–99)
Potassium: 4.8 mmol/L (ref 3.5–5.1)
Sodium: 137 mmol/L (ref 135–145)

## 2023-07-12 LAB — TROPONIN I (HIGH SENSITIVITY): Troponin I (High Sensitivity): 4 ng/L (ref <18)

## 2023-07-12 LAB — URINALYSIS, ROUTINE W REFLEX MICROSCOPIC
Bacteria, UA: NONE SEEN
Bilirubin Urine: NEGATIVE
Glucose, UA: >=500 mg/dL — AB
Hgb urine dipstick: NEGATIVE
Ketones, ur: NEGATIVE mg/dL
Leukocytes,Ua: NEGATIVE
Nitrite: NEGATIVE
Protein, ur: NEGATIVE mg/dL
Specific Gravity, Urine: 1.021 (ref 1.005–1.030)
pH: 5 (ref 5.0–8.0)

## 2023-07-12 LAB — CBG MONITORING, ED: Glucose-Capillary: 107 mg/dL — ABNORMAL HIGH (ref 70–99)

## 2023-07-12 MED ORDER — SODIUM CHLORIDE 0.9 % IV BOLUS: 1000.0000 mL | Freq: Once | INTRAVENOUS | Status: DC

## 2023-07-12 MED ORDER — SODIUM CHLORIDE 0.9 % IV BOLUS
1000.0000 mL | Freq: Once | INTRAVENOUS | Status: AC
  Administered 2023-07-12: 1000 mL via INTRAVENOUS

## 2023-07-12 MED ORDER — SODIUM CHLORIDE 0.9 % IV SOLN
Freq: Once | INTRAVENOUS | Status: DC
Start: 2023-07-12 — End: 2023-07-12

## 2023-07-12 MED ORDER — MECLIZINE HCL 25 MG PO TABS: 25.0000 mg | ORAL_TABLET | Freq: Three times a day (TID) | ORAL | 0 refills | Status: DC | PRN

## 2023-07-12 NOTE — ED Provider Triage Note (Signed)
Emergency Medicine Provider Triage Evaluation Note  Ruben Pierce , a 57 y.o. male  was evaluated in triage.  Pt complains of dizziness  Dizzy for 2 days, room spinning sensation, worse with head turning or walking.  Improved with rest.  No numbness or tingling to extremities.  No speech or vision changes.  No head trauma.  No fevers.  Denies any symptoms in the past.  No hearing loss or tinnitus..  Review of Systems  Positive: Dizzy Negative: Numbness  Physical Exam  BP 107/73   Pulse 96   Temp 97.6 F (36.4 C)   Resp 16   Ht 6\' 1"  (1.854 m)   Wt 84.6 kg   SpO2 97%   BMI 24.61 kg/m  Gen:   Awake, no distress   Resp:  Normal effort  MSK:   Moves extremities without difficulty  Other:  Neuroexam is nonfocal, no facial droop, no drift, finger-nose normal, sensation equal bilateral, strength equal bilateral upper and lower extremities.  GCS 15, A and O x 3.  Medical Decision Making  Medically screening exam initiated at 1:27 PM.  Appropriate orders placed.  [name] was informed that the remainder of the evaluation will be completed by another provider, this initial triage assessment does not replace that evaluation, and the importance of remaining in the ED until their evaluation is complete.  Screening labs ordered in triage, exam is stable.  Favor peripheral vertigo, disequilibrium.  He is currently asymptomatic   [name], DO 07/12/23 1328

## 2023-07-12 NOTE — ED Notes (Signed)
Provided with 2 cups of water and graham crackers.  Water for hydration per MD due to IVF shortage

## 2023-07-12 NOTE — ED Notes (Signed)
No reports of dizziness.

## 2023-07-12 NOTE — Discharge Instructions (Addendum)
Dear Mr Botelho, It was a pleasure taking care of you at [facility]. You were seen for  dizziness and passing out.All your labs and imaging are normal. We are discharging you home now that you are doing better. Please follow the following instructions.  1) Please stay hydrated and return to the ED if you have worsening chest pain and Shortness of breath  Take care,  Dr. Kathleen Lime, MD

## 2023-07-12 NOTE — ED Provider Notes (Addendum)
[place] EMERGENCY DEPARTMENT AT [facility] Provider Note   CSN: [id number] Arrival date & time: 07/12/23  1148     History  Chief Complaint  Patient presents with   Dizziness    Ruben Pierce is a 57 y.o. male.   Dizziness  This is a 57 year old male with a history of asthma hypertension alcohol abuse and type 2 diabetes presenting to the emergency department due to concerns for a syncopal episode.  Patient said he was moving furniture with a couple of his friends when he felt dizzy and passed out for couple of minutes. When he woke up , he said he saw his friends surrounding him and asking him if he was okay.  Patient however denied any chest pain shortness of breath at this time.Reports  this is the second time he had a syncopal episode . He passed out in his bathroom about 5 months ago but felt fine afterward and did seek medical attention at that time.   Home Medications Prior to Admission medications   Medication Sig Start Date End Date Taking? Authorizing Provider  meclizine (ANTIVERT) 25 MG tablet Take 1 tablet (25 mg total) by mouth 3 (three) times daily as needed for dizziness. 07/12/23  Yes Tanda Rockers A, DO  acetaminophen (TYLENOL) 500 MG tablet Take 2 tablets (1,000 mg total) by mouth every 8 (eight) hours as needed. 08/29/22   Rocky Morel, DO  amitriptyline (ELAVIL) 100 MG tablet Take 1 tablet (100 mg total) by mouth at bedtime. 03/14/23   Rocky Morel, DO  amLODipine (NORVASC) 10 MG tablet Take 1 tablet (10 mg total) by mouth daily. 04/23/23 04/22/24  Rocky Morel, DO  aspirin EC 81 MG tablet Take 1 tablet (81 mg total) by mouth daily. Swallow whole. 11/30/22 08/27/23  Evlyn Kanner, MD  atorvastatin (LIPITOR) 80 MG tablet TAKE ONE TABLET BY MOUTH EVERY DAY 01/16/23   Rocky Morel, DO  diclofenac Sodium (VOLTAREN) 1 % GEL APPLY FOUR grams topically FOUR TIMES DAILY 07/05/23   Rocky Morel, DO  enalapril (VASOTEC) 10 MG tablet Take 1 tablet  (10 mg total) by mouth daily. 06/15/23   Rocky Morel, DO  famotidine (PEPCID) 20 MG tablet TAKE ONE TABLET BY MOUTH TWICE DAILY 05/22/23   Rocky Morel, DO  gentamicin ointment (GARAMYCIN) 0.1 % Apply 1 Application topically 3 (three) times daily. 08/29/22   Rocky Morel, DO  glucose blood (ACCU-CHEK AVIVA PLUS) test strip USE AS DIRECTED EVERY DAY 07/09/23   Rocky Morel, DO  ibuprofen (ADVIL) 200 MG tablet Take 1 tablet (200 mg total) by mouth every 6 (six) hours as needed. 08/29/22   Rocky Morel, DO  INVOKANA 300 MG TABS tablet TAKE ONE TABLET BY MOUTH EVERY DAY BEFORE BREAKFAST 03/06/23   Rocky Morel, DO  Lancets (FREESTYLE) lancets Use as instructed 12/05/21   Elige Radon, MD  metFORMIN (GLUCOPHAGE) 1000 MG tablet Take 1 tablet (1,000 mg total) by mouth 2 (two) times daily with a meal. 07/06/22   Rocky Morel, DO  mupirocin ointment (BACTROBAN) 2 % APPLY TO THE AFFECTED AREA(S) EVERY DAY 07/09/23   Louann Sjogren, DPM  naltrexone (DEPADE) 50 MG tablet Take 1 tablet (50 mg total) by mouth daily. 05/03/23   Carmina Miller, DO  nicotine (NICODERM CQ - DOSED IN MG/24 HOURS) 14 mg/24hr patch APPLY ONE PATCH onto shin EVERY DAY 07/05/23   Rocky Morel, DO  pantoprazole (PROTONIX) 40 MG tablet Take 1 tablet (40 mg total) by mouth daily. 04/23/23 04/22/24  Geraldo Pitter,  Jomarie Longs, DO  sildenafil (VIAGRA) 50 MG tablet Take 1 tablet (50 mg total) by mouth as needed for erectile dysfunction. 09/15/20 09/15/21  Elige Radon, MD  STIOLTO RESPIMAT 2.5-2.5 MCG/ACT AERS INHALE TWO puffs into THE lungs EVERY DAY 06/06/23   Rocky Morel, DO  varenicline (CHANTIX) 1 MG tablet Take 1 tablet (1 mg total) by mouth 2 (two) times daily. 05/03/23   Carmina Miller, DO      Allergies    Patient has no known allergies.    Review of Systems   Review of Systems  Neurological:  Positive for dizziness.    Physical Exam Updated Vital Signs BP (!) 150/94   Pulse 88   Temp 97.9 F (36.6 C) (Oral)   Resp  18   Ht 6\' 1"  (1.854 m)   Wt 84.6 kg   SpO2 100%   BMI 24.61 kg/m  Physical Exam Constitutional:      General: He is not in acute distress.    Appearance: Normal appearance. He is not ill-appearing, toxic-appearing or diaphoretic.  Cardiovascular:     Rate and Rhythm: Normal rate.  Neurological:     Mental Status: He is alert.    ED Results / Procedures / Treatments   Labs (all labs ordered are listed, but only abnormal results are displayed) Labs Reviewed  BASIC METABOLIC PANEL - Abnormal; Notable for the following components:      Result Value   Glucose, Bld 103 (*)    Creatinine, Ser 1.51 (*)    GFR, Estimated 54 (*)    All other components within normal limits  CBC - Abnormal; Notable for the following components:   RDW 16.0 (*)    All other components within normal limits  URINALYSIS, ROUTINE W REFLEX MICROSCOPIC - Abnormal; Notable for the following components:   Glucose, UA >=500 (*)    All other components within normal limits  CBG MONITORING, ED - Abnormal; Notable for the following components:   Glucose-Capillary 107 (*)    All other components within normal limits  TROPONIN I (HIGH SENSITIVITY)    EKG EKG Interpretation Date/Time:  Thursday July 12 2023 12:30:34 EDT Ventricular Rate:  96 PR Interval:  148 QRS Duration:  104 QT Interval:  326 QTC Calculation: 411 R Axis:   66  Text Interpretation: Normal sinus rhythm Normal ECG When compared with ECG of 06-Dec-2022 18:58, No significant change since last tracing Confirmed by Benjiman Core [id number]) on 07/12/2023 4:13:22 PM  Radiology No results found.  Procedures Procedures    Medications Ordered in ED Medications  sodium chloride 0.9 % bolus 1,000 mL (has no administration in time range)     ED Course/ Medical Decision Making/ A&P Clinical Course as of 07/12/23 1833  Thu Jul 12, 2023  1824 Troponin I (High Sensitivity) [PA]    Clinical Course User Index [PA] Kathleen Lime, MD                                  Medical Decision Making Amount and/or Complexity of Data Reviewed Labs: ordered. Decision-making details documented in ED Course. Radiology: ordered.   This patient is a 57 y.o. male who presents to the ED for concern of syncopal episode , this involves an extensive number of treatment options, and is a complaint that carries with it a high risk of complications and morbidity. The emergent differential diagnosis prior to evaluation includes, but is not limited to,  Anemia , arrhythmia, ACS  . This is not an exhaustive differential.  CBC came back normal did not evidence of anemia. BMP did not show any evidence of electrolyte derangements that could be causing arrhythmia at this time. Patient did not hit his head did not report any recent trauma.  He had done few weeks ago did not show any acute intracranial abnormality.  Denies any chest pain or shortness of breath.  Patient reported not eating or drinking anything today.  I wonder if patient was dehydrated that led to hypotension and eventually to his syncopal episode.  His blood pressure on presentation was 107/73.  Consider getting a troponin chest x-ray to rule out any cardiac or intrathoracic infectious processes at this time. NS Bolus given.  Past Medical History / Co-morbidities / Social History: HTN  Physical Exam: Physical exam performed. The pertinent findings include: No abnormal heart sounds appreciated.Breathing on RA ,sating well   Lab Tests: I ordered, and personally interpreted labs.  The pertinent results include: CBC unremarkable. Hgb WNL.Potassium within normal limit.    Imaging Studies: I ordered imaging studies including CXR,EKG. I independently visualized and interpreted imaging.EKG showed normal sinus . I agree with the radiologist interpretation.   Disposition: After consideration of the diagnostic results and the patients response to treatment, I feel that emergency department workup does  not suggest an emergent condition requiring admission or immediate intervention beyond what has been performed at this time. The plan is discharge patient with instructions to following up with his PCP.The patient is safe for discharge and has been instructed to return immediately for worsening symptoms, change in symptoms or any other concerns.  I discussed this case with my attending physician Dr. Rubin Payor who cosigned this note including patient's presenting symptoms, physical exam, and planned diagnostics and interventions. Attending physician stated agreement with plan or made changes to plan which were implemented.    Final Clinical Impression(s) / ED Diagnoses Final diagnoses:  Dizziness  Near syncope    Rx / DC Orders ED Discharge Orders          Ordered    meclizine (ANTIVERT) 25 MG tablet  3 times daily PRN        07/12/23 1328              Kathleen Lime, MD 07/12/23 1820    Kathleen Lime, MD 07/12/23 Brooke Pace    Benjiman Core, MD 07/20/23 [phone]

## 2023-07-12 NOTE — ED Triage Notes (Signed)
Pt c/o dizzinessx2wks. PT denies any other sx.

## 2023-07-12 NOTE — ED Notes (Signed)
Patient's brother Ramal Eckhardt was called for a ride home, no legal guardian listed on chart, attempted to call his sister Junious Dresser, however her listed number is disconnected. Scotty will provide the patient a ride home.

## 2023-07-17 ENCOUNTER — Other Ambulatory Visit: Payer: Self-pay | Admitting: Student

## 2023-07-17 DIAGNOSIS — E119 Type 2 diabetes mellitus without complications: Secondary | ICD-10-CM

## 2023-07-19 ENCOUNTER — Other Ambulatory Visit: Payer: Self-pay | Admitting: Student

## 2023-07-19 DIAGNOSIS — M79604 Pain in right leg: Secondary | ICD-10-CM

## 2023-08-03 DIAGNOSIS — Z419 Encounter for procedure for purposes other than remedying health state, unspecified: Secondary | ICD-10-CM | POA: Diagnosis not present

## 2023-08-08 ENCOUNTER — Encounter: Payer: Medicaid Other | Admitting: Student

## 2023-08-10 ENCOUNTER — Other Ambulatory Visit: Payer: Self-pay | Admitting: Student

## 2023-08-10 DIAGNOSIS — J449 Chronic obstructive pulmonary disease, unspecified: Secondary | ICD-10-CM

## 2023-08-16 ENCOUNTER — Other Ambulatory Visit: Payer: Self-pay

## 2023-08-16 DIAGNOSIS — M79604 Pain in right leg: Secondary | ICD-10-CM

## 2023-08-16 MED ORDER — DICLOFENAC SODIUM 1 % EX GEL
2.0000 g | Freq: Four times a day (QID) | CUTANEOUS | 1 refills | Status: DC
Start: 2023-08-16 — End: 2023-09-04

## 2023-08-27 ENCOUNTER — Encounter: Payer: Medicaid Other | Admitting: Student

## 2023-09-02 DIAGNOSIS — Z419 Encounter for procedure for purposes other than remedying health state, unspecified: Secondary | ICD-10-CM | POA: Diagnosis not present

## 2023-09-03 ENCOUNTER — Other Ambulatory Visit: Payer: Self-pay | Admitting: Student

## 2023-09-03 DIAGNOSIS — M79604 Pain in right leg: Secondary | ICD-10-CM

## 2023-09-03 DIAGNOSIS — G4700 Insomnia, unspecified: Secondary | ICD-10-CM

## 2023-09-10 ENCOUNTER — Other Ambulatory Visit: Payer: Self-pay | Admitting: Student

## 2023-09-10 DIAGNOSIS — E119 Type 2 diabetes mellitus without complications: Secondary | ICD-10-CM

## 2023-09-19 ENCOUNTER — Other Ambulatory Visit: Payer: Self-pay

## 2023-09-19 DIAGNOSIS — E119 Type 2 diabetes mellitus without complications: Secondary | ICD-10-CM

## 2023-09-19 MED ORDER — METFORMIN HCL 1000 MG PO TABS: 1000.0000 mg | ORAL_TABLET | Freq: Two times a day (BID) | ORAL | 2 refills | Status: DC

## 2023-09-19 NOTE — Telephone Encounter (Signed)
  Medication sent to pharmacy. Called patient to schedule a fu appointment with [place], unable to reach patient and unable to lvm.

## 2023-10-03 DIAGNOSIS — Z419 Encounter for procedure for purposes other than remedying health state, unspecified: Secondary | ICD-10-CM | POA: Diagnosis not present

## 2023-10-09 ENCOUNTER — Other Ambulatory Visit: Payer: Self-pay | Admitting: Student

## 2023-10-09 DIAGNOSIS — E119 Type 2 diabetes mellitus without complications: Secondary | ICD-10-CM

## 2023-10-09 DIAGNOSIS — E1169 Type 2 diabetes mellitus with other specified complication: Secondary | ICD-10-CM

## 2023-10-09 NOTE — Telephone Encounter (Signed)
 Patient last seen 05/03/23,I called the patient unable to reach the patient unable to lvm for him to give [place] a call to schedule a appointment.

## 2023-10-11 ENCOUNTER — Other Ambulatory Visit: Payer: Self-pay | Admitting: Student

## 2023-10-11 DIAGNOSIS — J449 Chronic obstructive pulmonary disease, unspecified: Secondary | ICD-10-CM

## 2023-10-11 NOTE — Telephone Encounter (Signed)
 LOV 05/03/23 with "Return in about 3 months (around 08/03/2023)."  Pt was called to schedule an appt but "call cannot be completed." Also I called contact person - mailbox full, unable to leave a message.

## 2023-10-15 ENCOUNTER — Other Ambulatory Visit: Payer: Self-pay | Admitting: Podiatry

## 2023-10-29 ENCOUNTER — Other Ambulatory Visit: Payer: Self-pay | Admitting: Student

## 2023-11-03 DIAGNOSIS — Z419 Encounter for procedure for purposes other than remedying health state, unspecified: Secondary | ICD-10-CM | POA: Diagnosis not present

## 2023-11-06 ENCOUNTER — Other Ambulatory Visit: Payer: Self-pay | Admitting: Student

## 2023-11-06 DIAGNOSIS — M79604 Pain in right leg: Secondary | ICD-10-CM

## 2023-11-19 ENCOUNTER — Other Ambulatory Visit: Payer: Self-pay | Admitting: Student

## 2023-11-19 DIAGNOSIS — M79604 Pain in right leg: Secondary | ICD-10-CM

## 2023-11-19 NOTE — Telephone Encounter (Signed)
 LOV 05-03-23

## 2023-12-01 DIAGNOSIS — Z419 Encounter for procedure for purposes other than remedying health state, unspecified: Secondary | ICD-10-CM | POA: Diagnosis not present

## 2023-12-04 ENCOUNTER — Encounter: Payer: Medicaid Other | Admitting: Student

## 2023-12-04 NOTE — Progress Notes (Deleted)
***       CC: Routine follow-up visit after last office visit 05/03/2023  HPI:  Ruben Pierce is a 58 y.o. 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 Please see assessment and plan below for further details.  Review of Systems:   Pertinent items noted in HPI and/or A&P.  Physical Exam:  There were no vitals filed for this visit.  Constitutional:***. In no acute distress. HEENT: Normocephalic, atraumatic, Sclera non-icteric, PERRL, EOM intact Cardio:Regular rate and rhythm. 2+ bilateral {PulseLoc:28294} pulses. Pulm:Clear to auscultation bilaterally. Normal work of breathing on room air. Abdomen: Soft, non-tender, non-distended, positive bowel sounds. ZOX:WRUEAVWU for extremity edema. Skin:Warm and dry. Neuro:Alert and oriented x3. No focal deficit noted. Psych:Pleasant mood and affect.   Assessment & Plan:   No problem-specific Assessment & Plan notes found for this encounter.    Patient {GC/GE:3044014::"discussed with","seen with"} Dr. {NAMES:[id number]::"Lau","Guilloud","Hoffman","Machen","Chambliss","Winfrey","Williams","Vincent"}  Rocky Morel, DO [facility] Resident PGY-2 Clinic Phone: [phone] Pager: [phone]

## 2023-12-13 ENCOUNTER — Emergency Department (HOSPITAL_COMMUNITY)

## 2023-12-13 ENCOUNTER — Encounter (HOSPITAL_COMMUNITY): Payer: Self-pay

## 2023-12-13 ENCOUNTER — Other Ambulatory Visit: Payer: Self-pay

## 2023-12-13 ENCOUNTER — Inpatient Hospital Stay (HOSPITAL_COMMUNITY)
Admission: EM | Admit: 2023-12-13 | Discharge: 2023-12-19 | DRG: 896 | Disposition: A | Discharge status: Home / Self Care | Attending: Internal Medicine | Admitting: Internal Medicine

## 2023-12-13 DIAGNOSIS — F1290 Cannabis use, unspecified, uncomplicated: Secondary | ICD-10-CM | POA: Diagnosis present

## 2023-12-13 DIAGNOSIS — R748 Abnormal levels of other serum enzymes: Secondary | ICD-10-CM | POA: Diagnosis present

## 2023-12-13 DIAGNOSIS — F17210 Nicotine dependence, cigarettes, uncomplicated: Secondary | ICD-10-CM | POA: Diagnosis present

## 2023-12-13 DIAGNOSIS — E119 Type 2 diabetes mellitus without complications: Secondary | ICD-10-CM | POA: Diagnosis not present

## 2023-12-13 DIAGNOSIS — R7989 Other specified abnormal findings of blood chemistry: Secondary | ICD-10-CM | POA: Diagnosis not present

## 2023-12-13 DIAGNOSIS — E785 Hyperlipidemia, unspecified: Secondary | ICD-10-CM | POA: Diagnosis present

## 2023-12-13 DIAGNOSIS — Z743 Need for continuous supervision: Secondary | ICD-10-CM | POA: Diagnosis not present

## 2023-12-13 DIAGNOSIS — Z66 Do not resuscitate: Secondary | ICD-10-CM | POA: Diagnosis not present

## 2023-12-13 DIAGNOSIS — I6782 Cerebral ischemia: Secondary | ICD-10-CM | POA: Diagnosis not present

## 2023-12-13 DIAGNOSIS — F17200 Nicotine dependence, unspecified, uncomplicated: Secondary | ICD-10-CM | POA: Diagnosis present

## 2023-12-13 DIAGNOSIS — R4182 Altered mental status, unspecified: Secondary | ICD-10-CM | POA: Diagnosis present

## 2023-12-13 DIAGNOSIS — F10139 Alcohol abuse with withdrawal, unspecified: Secondary | ICD-10-CM | POA: Diagnosis not present

## 2023-12-13 DIAGNOSIS — X58XXXA Exposure to other specified factors, initial encounter: Secondary | ICD-10-CM | POA: Diagnosis present

## 2023-12-13 DIAGNOSIS — I499 Cardiac arrhythmia, unspecified: Secondary | ICD-10-CM | POA: Diagnosis not present

## 2023-12-13 DIAGNOSIS — G9340 Encephalopathy, unspecified: Principal | ICD-10-CM | POA: Diagnosis present

## 2023-12-13 DIAGNOSIS — Z7984 Long term (current) use of oral hypoglycemic drugs: Secondary | ICD-10-CM | POA: Diagnosis not present

## 2023-12-13 DIAGNOSIS — R000 Tachycardia, unspecified: Secondary | ICD-10-CM | POA: Diagnosis present

## 2023-12-13 DIAGNOSIS — R4781 Slurred speech: Secondary | ICD-10-CM | POA: Diagnosis present

## 2023-12-13 DIAGNOSIS — I10 Essential (primary) hypertension: Secondary | ICD-10-CM | POA: Diagnosis not present

## 2023-12-13 DIAGNOSIS — F10229 Alcohol dependence with intoxication, unspecified: Secondary | ICD-10-CM | POA: Diagnosis not present

## 2023-12-13 DIAGNOSIS — T50904A Poisoning by unspecified drugs, medicaments and biological substances, undetermined, initial encounter: Secondary | ICD-10-CM | POA: Diagnosis not present

## 2023-12-13 DIAGNOSIS — F1090 Alcohol use, unspecified, uncomplicated: Secondary | ICD-10-CM | POA: Diagnosis present

## 2023-12-13 DIAGNOSIS — E871 Hypo-osmolality and hyponatremia: Secondary | ICD-10-CM | POA: Diagnosis present

## 2023-12-13 DIAGNOSIS — G929 Unspecified toxic encephalopathy: Secondary | ICD-10-CM | POA: Diagnosis not present

## 2023-12-13 DIAGNOSIS — M4312 Spondylolisthesis, cervical region: Secondary | ICD-10-CM | POA: Diagnosis not present

## 2023-12-13 DIAGNOSIS — Z79899 Other long term (current) drug therapy: Secondary | ICD-10-CM

## 2023-12-13 DIAGNOSIS — J449 Chronic obstructive pulmonary disease, unspecified: Secondary | ICD-10-CM | POA: Diagnosis present

## 2023-12-13 DIAGNOSIS — Z751 Person awaiting admission to adequate facility elsewhere: Secondary | ICD-10-CM | POA: Diagnosis not present

## 2023-12-13 DIAGNOSIS — M47812 Spondylosis without myelopathy or radiculopathy, cervical region: Secondary | ICD-10-CM | POA: Diagnosis not present

## 2023-12-13 DIAGNOSIS — R0602 Shortness of breath: Secondary | ICD-10-CM | POA: Diagnosis not present

## 2023-12-13 DIAGNOSIS — M4802 Spinal stenosis, cervical region: Secondary | ICD-10-CM | POA: Diagnosis not present

## 2023-12-13 DIAGNOSIS — R402431 Glasgow coma scale score 3-8, in the field [EMT or ambulance]: Secondary | ICD-10-CM | POA: Diagnosis not present

## 2023-12-13 DIAGNOSIS — R7401 Elevation of levels of liver transaminase levels: Secondary | ICD-10-CM | POA: Diagnosis present

## 2023-12-13 DIAGNOSIS — R569 Unspecified convulsions: Secondary | ICD-10-CM | POA: Diagnosis not present

## 2023-12-13 DIAGNOSIS — R404 Transient alteration of awareness: Secondary | ICD-10-CM | POA: Diagnosis not present

## 2023-12-13 LAB — RESPIRATORY PANEL BY PCR

## 2023-12-13 LAB — URINALYSIS, ROUTINE W REFLEX MICROSCOPIC
Bilirubin Urine: NEGATIVE
Glucose, UA: >=500 mg/dL — AB
Hgb urine dipstick: NEGATIVE
Ketones, ur: NEGATIVE mg/dL
Leukocytes,Ua: NEGATIVE
Nitrite: NEGATIVE
Protein, ur: 30 mg/dL — AB
Specific Gravity, Urine: 1.028 (ref 1.005–1.030)
pH: 6 (ref 5.0–8.0)

## 2023-12-13 LAB — RESP PANEL BY RT-PCR (RSV, FLU A&B, COVID)  RVPGX2
Influenza A by PCR: NEGATIVE
Influenza B by PCR: NEGATIVE
Resp Syncytial Virus by PCR: NEGATIVE
SARS Coronavirus 2 by RT PCR: NEGATIVE

## 2023-12-13 LAB — BLOOD GAS, VENOUS
Acid-Base Excess: 5.6 mmol/L — ABNORMAL HIGH (ref 0.0–2.0)
Bicarbonate: 30.6 mmol/L — ABNORMAL HIGH (ref 20.0–28.0)
O2 Saturation: 72.5 %
Patient temperature: 36.4
pCO2, Ven: 44 mmHg (ref 44–60)
pH, Ven: 7.45 — ABNORMAL HIGH (ref 7.25–7.43)
pO2, Ven: 39 mmHg (ref 32–45)

## 2023-12-13 LAB — I-STAT VENOUS BLOOD GAS, ED
Acid-Base Excess: 1 mmol/L (ref 0.0–2.0)
Bicarbonate: 25.2 mmol/L (ref 20.0–28.0)
Calcium, Ion: 1.08 mmol/L — ABNORMAL LOW (ref 1.15–1.40)
HCT: 47 % (ref 39.0–52.0)
Hemoglobin: 16 g/dL (ref 13.0–17.0)
O2 Saturation: 77 %
Potassium: 4 mmol/L (ref 3.5–5.1)
Sodium: 137 mmol/L (ref 135–145)
TCO2: 26 mmol/L (ref 22–32)
pCO2, Ven: 36.4 mmHg — ABNORMAL LOW (ref 44–60)
pH, Ven: 7.449 — ABNORMAL HIGH (ref 7.25–7.43)
pO2, Ven: 40 mmHg (ref 32–45)

## 2023-12-13 LAB — COMPREHENSIVE METABOLIC PANEL
ALT: 74 U/L — ABNORMAL HIGH (ref 0–44)
AST: 67 U/L — ABNORMAL HIGH (ref 15–41)
Albumin: 4.1 g/dL (ref 3.5–5.0)
Alkaline Phosphatase: 100 U/L (ref 38–126)
Anion gap: 13 (ref 5–15)
BUN: 11 mg/dL (ref 6–20)
CO2: 22 mmol/L (ref 22–32)
Calcium: 9.1 mg/dL (ref 8.9–10.3)
Chloride: 101 mmol/L (ref 98–111)
Creatinine, Ser: 1.12 mg/dL (ref 0.61–1.24)
GFR, Estimated: >60 mL/min (ref >60)
Glucose, Bld: 112 mg/dL — ABNORMAL HIGH (ref 70–99)
Potassium: 3.6 mmol/L (ref 3.5–5.1)
Sodium: 136 mmol/L (ref 135–145)
Total Bilirubin: 0.6 mg/dL (ref 0.0–1.2)
Total Protein: 6.9 g/dL (ref 6.5–8.1)

## 2023-12-13 LAB — ACETAMINOPHEN LEVEL: Acetaminophen (Tylenol), Serum: <10 ug/mL — ABNORMAL LOW (ref 10–30)

## 2023-12-13 LAB — SALICYLATE LEVEL: Salicylate Lvl: <7 mg/dL — ABNORMAL LOW (ref 7.0–30.0)

## 2023-12-13 LAB — CBC WITH DIFFERENTIAL/PLATELET
Abs Immature Granulocytes: 0.03 10*3/uL (ref 0.00–0.07)
Basophils Absolute: 0 10*3/uL (ref 0.0–0.1)
Basophils Relative: 0 %
Eosinophils Absolute: 0 10*3/uL (ref 0.0–0.5)
Eosinophils Relative: 0 %
HCT: 43.1 % (ref 39.0–52.0)
Hemoglobin: 14 g/dL (ref 13.0–17.0)
Immature Granulocytes: 0 %
Lymphocytes Relative: 17 %
Lymphs Abs: 2 10*3/uL (ref 0.7–4.0)
MCH: 26.8 pg (ref 26.0–34.0)
MCHC: 32.5 g/dL (ref 30.0–36.0)
MCV: 82.6 fL (ref 80.0–100.0)
Monocytes Absolute: 0.7 10*3/uL (ref 0.1–1.0)
Monocytes Relative: 6 %
Neutro Abs: 9.4 10*3/uL — ABNORMAL HIGH (ref 1.7–7.7)
Neutrophils Relative %: 77 %
Platelets: 218 10*3/uL (ref 150–400)
RBC: 5.22 MIL/uL (ref 4.22–5.81)
RDW: 14.7 % (ref 11.5–15.5)
WBC: 12.3 10*3/uL — ABNORMAL HIGH (ref 4.0–10.5)
nRBC: 0 % (ref 0.0–0.2)

## 2023-12-13 LAB — RAPID URINE DRUG SCREEN, HOSP PERFORMED
Amphetamines: NOT DETECTED
Barbiturates: NOT DETECTED
Benzodiazepines: NOT DETECTED
Cocaine: NOT DETECTED
Opiates: NOT DETECTED
Tetrahydrocannabinol: POSITIVE — AB

## 2023-12-13 LAB — AMMONIA: Ammonia: 38 umol/L — ABNORMAL HIGH (ref 9–35)

## 2023-12-13 MED ORDER — ACETAMINOPHEN 325 MG PO TABS
650.0000 mg | ORAL_TABLET | Freq: Four times a day (QID) | ORAL | Status: DC | PRN
  Administered 2023-12-16: 650 mg via ORAL
  Filled 2023-12-13: qty 2

## 2023-12-13 MED ORDER — THIAMINE MONONITRATE 100 MG PO TABS
100.0000 mg | ORAL_TABLET | Freq: Every day | ORAL | Status: DC
  Administered 2023-12-14 – 2023-12-19 (×6): 100 mg via ORAL
  Filled 2023-12-13 (×6): qty 1

## 2023-12-13 MED ORDER — UMECLIDINIUM BROMIDE 62.5 MCG/ACT IN AEPB
1.0000 | INHALATION_SPRAY | Freq: Every day | RESPIRATORY_TRACT | Status: DC
  Administered 2023-12-17 – 2023-12-19 (×3): 1 via RESPIRATORY_TRACT
  Filled 2023-12-13: qty 7

## 2023-12-13 MED ORDER — ADULT MULTIVITAMIN W/MINERALS CH
1.0000 | ORAL_TABLET | Freq: Every day | ORAL | Status: DC
  Administered 2023-12-14 – 2023-12-19 (×6): 1 via ORAL
  Filled 2023-12-13 (×7): qty 1

## 2023-12-13 MED ORDER — ACETAMINOPHEN 650 MG RE SUPP: 650.0000 mg | Freq: Four times a day (QID) | RECTAL | Status: DC | PRN

## 2023-12-13 MED ORDER — AMLODIPINE BESYLATE 5 MG PO TABS
10.0000 mg | ORAL_TABLET | Freq: Every day | ORAL | Status: DC
Start: 2023-12-14 — End: 2023-12-19
  Administered 2023-12-14 – 2023-12-19 (×6): 10 mg via ORAL
  Filled 2023-12-13 (×6): qty 2

## 2023-12-13 MED ORDER — FOLIC ACID 1 MG PO TABS
1.0000 mg | ORAL_TABLET | Freq: Every day | ORAL | Status: DC
  Administered 2023-12-14 – 2023-12-19 (×6): 1 mg via ORAL
  Filled 2023-12-13 (×7): qty 1

## 2023-12-13 MED ORDER — ENOXAPARIN SODIUM 40 MG/0.4ML IJ SOSY
40.0000 mg | PREFILLED_SYRINGE | INTRAMUSCULAR | Status: DC
  Administered 2023-12-13 – 2023-12-18 (×6): 40 mg via SUBCUTANEOUS
  Filled 2023-12-13 (×6): qty 0.4

## 2023-12-13 MED ORDER — THIAMINE HCL 100 MG/ML IJ SOLN
100.0000 mg | Freq: Every day | INTRAMUSCULAR | Status: DC
  Administered 2023-12-13: 100 mg via INTRAVENOUS
  Filled 2023-12-13: qty 2

## 2023-12-13 MED ORDER — NALOXONE HCL 0.4 MG/ML IJ SOLN
0.4000 mg | Freq: Once | INTRAMUSCULAR | Status: AC
  Administered 2023-12-13: 0.4 mg via INTRAVENOUS
  Filled 2023-12-13: qty 1

## 2023-12-13 MED ORDER — PANTOPRAZOLE SODIUM 40 MG PO TBEC
40.0000 mg | DELAYED_RELEASE_TABLET | Freq: Every day | ORAL | Status: DC
  Administered 2023-12-14 – 2023-12-19 (×6): 40 mg via ORAL
  Filled 2023-12-13 (×6): qty 1

## 2023-12-13 MED ORDER — LACTATED RINGERS IV BOLUS
500.0000 mL | Freq: Once | INTRAVENOUS | Status: AC
  Administered 2023-12-13: 500 mL via INTRAVENOUS

## 2023-12-13 MED ORDER — ENALAPRIL MALEATE 10 MG PO TABS
10.0000 mg | ORAL_TABLET | Freq: Every day | ORAL | Status: DC
  Administered 2023-12-14 – 2023-12-19 (×6): 10 mg via ORAL
  Filled 2023-12-13 (×6): qty 1

## 2023-12-13 MED ORDER — ARFORMOTEROL TARTRATE 15 MCG/2ML IN NEBU
15.0000 ug | INHALATION_SOLUTION | Freq: Two times a day (BID) | RESPIRATORY_TRACT | Status: DC
  Administered 2023-12-13 – 2023-12-19 (×10): 15 ug via RESPIRATORY_TRACT
  Filled 2023-12-13 (×11): qty 2

## 2023-12-13 NOTE — Progress Notes (Signed)
 Unable to reach sister Junious Dresser at numbers provided in EMR as emergency contacts.

## 2023-12-13 NOTE — ED Triage Notes (Signed)
 PT BIB EMS was found altered at home by his family member with which he lives with, last seen/known normal approximately 2 days ago, multiple pill bottles around the patient missing labels, possible overdose, per family he has needle marks in his arm.  118/70 99% RA Resp 22 HR 100 CBG 150 CAP 30 GCS 8

## 2023-12-13 NOTE — ED Notes (Signed)
 Please update sister; Junious Dresser number is under emergency contacts

## 2023-12-13 NOTE — Progress Notes (Signed)
 Tried calling sister twice to obtain information on patient. Phone rang 6 times and then a recorded message states no one was available by that number.

## 2023-12-13 NOTE — ED Provider Notes (Signed)
 [place] EMERGENCY DEPARTMENT AT [facility] Provider Note   CSN: [id number] Arrival date & time: 12/13/23  1502     History  Chief Complaint  Patient presents with   Altered Mental Status    Possible overdose    Bayne Fosnaugh is a 58 y.o. male.   Altered Mental Status  This patient is a 58 year old male who was found with significant decreased level of consciousness at home in his bedroom surrounded by multiple empty pill bottles with the pill bottle labels taken off.  He was requiring oxygen, he was difficult to arouse, he has not been able to give any answers as he just groans to painful stimuli.  Paramedics are not able to give any other information but there is a suspected overdose as the patient was found around multiple empty pill bottles, the labels were taken off    Home Medications Prior to Admission medications   Medication Sig Start Date End Date Taking? Authorizing Provider  acetaminophen (TYLENOL) 500 MG tablet Take 2 tablets (1,000 mg total) by mouth every 8 (eight) hours as needed. 08/29/22   Rocky Morel, DO  amitriptyline (ELAVIL) 100 MG tablet TAKE ONE TABLET BY MOUTH AT BEDTIME 09/04/23   Rocky Morel, DO  amLODipine (NORVASC) 10 MG tablet Take 1 tablet (10 mg total) by mouth daily. 04/23/23 04/22/24  Rocky Morel, DO  atorvastatin (LIPITOR) 80 MG tablet TAKE ONE TABLET BY MOUTH EVERY DAY 10/09/23   Rocky Morel, DO  diclofenac Sodium (VOLTAREN) 1 % GEL APPLY TWO grams topically FOUR TIMES DAILY 11/20/23   Rocky Morel, DO  enalapril (VASOTEC) 10 MG tablet Take 1 tablet (10 mg total) by mouth daily. 06/15/23   Rocky Morel, DO  famotidine (PEPCID) 20 MG tablet TAKE ONE TABLET BY MOUTH TWICE DAILY 05/22/23   Rocky Morel, DO  gentamicin ointment (GARAMYCIN) 0.1 % Apply 1 Application topically 3 (three) times daily. 08/29/22   Rocky Morel, DO  glucose blood (ACCU-CHEK AVIVA PLUS) test strip USE AS DIRECTED EVERY DAY 07/09/23   Rocky Morel, DO  ibuprofen (ADVIL) 200 MG tablet Take 1 tablet (200 mg total) by mouth every 6 (six) hours as needed. 08/29/22   Rocky Morel, DO  INVOKANA 300 MG TABS tablet TAKE ONE TABLET BY MOUTH EVERY DAY ** BEFORE breakfast ** 09/10/23   Rocky Morel, DO  Lancets (FREESTYLE) lancets Use as instructed 12/05/21   Elige Radon, MD  meclizine (ANTIVERT) 25 MG tablet Take 1 tablet (25 mg total) by mouth 3 (three) times daily as needed for dizziness. 07/12/23   [name], DO  metFORMIN (GLUCOPHAGE) 1000 MG tablet Take 1 tablet (1,000 mg total) by mouth 2 (two) times daily with a meal. 09/19/23   Rocky Morel, DO  mupirocin ointment (BACTROBAN) 2 % APPLY TO THE AFFECTED AREA(S) EVERY DAY 10/15/23   Louann Sjogren, DPM  naltrexone (DEPADE) 50 MG tablet Take 1 tablet (50 mg total) by mouth daily. 05/03/23   Carmina Felice Hope, DO  nicotine (GNP NICOTINE) 14 mg/24hr patch APPLY ONE PATCH onto THE SKIN EVERY DAY 11/01/23   Rocky Morel, DO  pantoprazole (PROTONIX) 40 MG tablet Take 1 tablet (40 mg total) by mouth daily. 04/23/23 04/22/24  Rocky Morel, DO  sildenafil (VIAGRA) 50 MG tablet Take 1 tablet (50 mg total) by mouth as needed for erectile dysfunction. 09/15/20 09/15/21  Elige Radon, MD  STIOLTO RESPIMAT 2.5-2.5 MCG/ACT AERS INHALE TWO puffs into THE lungs EVERY DAY 10/11/23   Gwenevere Abbot, MD  varenicline (CHANTIX) 1 MG tablet Take 1 tablet (1 mg total) by mouth 2 (two) times daily. 05/03/23   Carmina Kailey Esquilin, DO      Allergies    Patient has no known allergies.    Review of Systems   Review of Systems  Unable to perform ROS: Mental status change    Physical Exam Updated Vital Signs BP 104/71 (BP Location: Right Arm)   Pulse (!) 101   Temp (!) 97.5 F (36.4 C) (Axillary)   Resp (!) 23   SpO2 100%  Physical Exam Vitals and nursing note reviewed.  Constitutional:      General: He is in acute distress.     Appearance: He is well-developed. He is ill-appearing.     Comments:  Obtunded but arousable to painful stimuli  HENT:     Head: Normocephalic and atraumatic.     Mouth/Throat:     Mouth: Mucous membranes are dry.     Pharynx: No oropharyngeal exudate.  Eyes:     General: No scleral icterus.       Right eye: No discharge.        Left eye: No discharge.     Conjunctiva/sclera: Conjunctivae normal.     Pupils: Pupils are equal, round, and reactive to light.  Neck:     Thyroid: No thyromegaly.     Vascular: No JVD.  Cardiovascular:     Rate and Rhythm: Regular rhythm. Tachycardia present.     Heart sounds: Normal heart sounds. No murmur heard.    No friction rub. No gallop.  Pulmonary:     Effort: Respiratory distress present.     Breath sounds: Normal breath sounds. No wheezing or rales.  Abdominal:     General: Bowel sounds are normal. There is no distension.     Palpations: Abdomen is soft. There is no mass.     Tenderness: There is no abdominal tenderness.  Musculoskeletal:        General: No tenderness. Normal range of motion.     Cervical back: Normal range of motion and neck supple.     Right lower leg: No edema.     Left lower leg: No edema.  Lymphadenopathy:     Cervical: No cervical adenopathy.  Skin:    General: Skin is warm and dry.     Findings: No erythema or rash.  Neurological:     Comments: The patient is not able to follow commands, to painful stimuli he uses both arms and both legs to focalized pain, he does not do anything background noises, he will not open his eyes to anything but pain  Psychiatric:        Behavior: Behavior normal.     ED Results / Procedures / Treatments   Labs (all labs ordered are listed, but only abnormal results are displayed) Labs Reviewed  COMPREHENSIVE METABOLIC PANEL - Abnormal; Notable for the following components:      Result Value   Glucose, Bld 112 (*)    AST 67 (*)    ALT 74 (*)    All other components within normal limits  CBC WITH DIFFERENTIAL/PLATELET - Abnormal; Notable for the  following components:   WBC 12.3 (*)    Neutro Abs 9.4 (*)    All other components within normal limits  AMMONIA - Abnormal; Notable for the following components:   Ammonia 38 (*)    All other components within normal limits  ACETAMINOPHEN LEVEL - Abnormal; Notable for the following components:  Acetaminophen (Tylenol), Serum <10 (*)    All other components within normal limits  SALICYLATE LEVEL - Abnormal; Notable for the following components:   Salicylate Lvl <7.0 (*)    All other components within normal limits  I-STAT VENOUS BLOOD GAS, ED - Abnormal; Notable for the following components:   pH, Ven 7.449 (*)    pCO2, Ven 36.4 (*)    Calcium, Ion 1.08 (*)    All other components within normal limits  URINALYSIS, ROUTINE W REFLEX MICROSCOPIC  BLOOD GAS, VENOUS  RAPID URINE DRUG SCREEN, HOSP PERFORMED  CBG MONITORING, ED    EKG EKG Interpretation Date/Time:  Thursday December 13 2023 15:08:38 EDT Ventricular Rate:  101 PR Interval:  149 QRS Duration:  106 QT Interval:  356 QTC Calculation: 462 R Axis:   65  Text Interpretation: Sinus tachycardia Borderline T wave abnormalities Confirmed by Eber Hong ([id number]) on 12/13/2023 3:12:42 PM  Radiology No results found.  Procedures .Critical Care  Performed by: Eber Hong, MD Authorized by: Eber Hong, MD   Critical care provider statement:    Critical care time (minutes):  45   Critical care time was exclusive of:  Separately billable procedures and treating other patients   Critical care was necessary to treat or prevent imminent or life-threatening deterioration of the following conditions:  CNS failure or compromise and toxidrome   Critical care was time spent personally by me on the following activities:  Development of treatment plan with patient or surrogate, discussions with consultants, evaluation of patient's response to treatment, examination of patient, obtaining history from patient or surrogate, review of  old charts, re-evaluation of patient's condition, pulse oximetry, ordering and review of radiographic studies, ordering and review of laboratory studies and ordering and performing treatments and interventions   I assumed direction of critical care for this patient from another provider in my specialty: no     Care discussed with: admitting provider   Comments:           Medications Ordered in ED Medications  naloxone (NARCAN) injection 0.4 mg (0.4 mg Intravenous Given 12/13/23 1558)    ED Course/ Medical Decision Making/ A&P                                 Medical Decision Making Amount and/or Complexity of Data Reviewed Labs: ordered. Radiology: ordered.  Risk Prescription drug management. Decision regarding hospitalization.    This patient presents to the ED for concern of overdose, stroke, MI, this involves an extensive number of treatment options, and is a complaint that carries with it a high risk of complications and morbidity.  The differential diagnosis includes multiple potential etiologies for his altered mental status   Co morbidities that complicate the patient evaluation  Hypertension and diabetes   Additional history obtained:  Additional history obtained from medical record External records from outside source obtained and reviewed including prior evaluations in the office, has been seen as recently as August of last year by his internal medicine doctor, has no recent admissions to the hospital   Lab Tests:  I Ordered, and personally interpreted labs.  The pertinent results include: Slight respiratory alkalosis, metabolic panel unremarkable, CBC with slight leukocytosis   Imaging Studies ordered:  I ordered imaging studies including CT scan of the head cervical spine and chest x-ray I independently visualized and interpreted imaging which showed no acute findings I agree with the radiologist interpretation  Cardiac Monitoring: / EKG:  The  patient was maintained on a cardiac monitor.  I personally viewed and interpreted the cardiac monitored which showed an underlying rhythm of: Sinus tachycardia   Consultations Obtained:  I requested consultation with the internal medicine resident,  and discussed lab and imaging findings as well as pertinent plan - they recommend: Admission to higher level of care with cardiac monitoring, neuro monitoring, and minimum stepdown   Problem List / ED Course / Critical interventions / Medication management  The patient is critically ill with what appears to be a likely toxidrome.  I do not see any evidence of wide-complex QRS or signs of TCA overdose, he does take a TCA.  I do not see hypoglycemia he is not bradycardic, he has a persistent and dense altered mental status which will need to be admitted to the hospital.  Critical care provided, frequent neurochecks provided, no decompensation and care.  No seizures.  No aspiration, no tachycardia of any concern, no signs of trauma  Reevaluation of the patient after these medicines showed that the patient critically ill I have reviewed the patients home medicines and have made adjustments as needed   Social Determinants of Health:  Likely drug use   Test / Admission - Considered:  Admit to higher level of care         Final Clinical Impression(s) / ED Diagnoses Final diagnoses:  Acute encephalopathy  Overdose of undetermined intent, initial encounter    Rx / DC Orders ED Discharge Orders     None         Eber Hong, MD 12/13/23 1824

## 2023-12-13 NOTE — H&P (Cosign Needed)
 Date: 12/13/2023               Patient Name:  Ruben Pierce MRN: [id number]  DOB: 1965-11-09 Age / Sex: 58 y.o., male   PCP: Rocky Morel, DO         Medical Service: Internal Medicine Teaching Service         Attending Physician: Dr. Gust Rung, DO      First Contact: Dr. Annett Fabian, MD Pager [phone]    Second Contact: Dr. Modena Slater, DO Pager [phone]         After Hours (After 5p/  First Contact Pager: [phone]  weekends / holidays): Second Contact Pager: [phone]   SUBJECTIVE   Chief Complaint: AMS  History of Present Illness:  Mycal Conde is a 58 yo male with T2DM, HTN, HLD, COPD, alcohol use disorder, drug abuse who was brought in by EMS after being found down at his home. Per EMS report, patient was found obtunded at home with GCS 8 and empty pill bottles nearby.  Family suspected he had had a seizure, though no known history of seizures. Patient has a history of an unknown neurologic deficit, family could not elaborate on deficit.  Patient follows commands and can answer questions appropriately though his speech is slurred and difficult to understand. Denies recent illnesses, fevers, chills, chest pain, abd pain  ED Course: Afeb, HDS, initially required O2 Obtunded but arousable to painful stimuli WBC 12 UA negative CXR negative CT head, cervical spine negative Utox only positive for THC  Meds:  Current Outpatient Medications  Medication Instructions   acetaminophen (TYLENOL) 1,000 mg, Oral, Every 8 hours PRN   amitriptyline (ELAVIL) 100 mg, Oral, Daily at bedtime   amLODipine (NORVASC) 10 mg, Oral, Daily   atorvastatin (LIPITOR) 80 mg, Oral, Daily   diclofenac Sodium (VOLTAREN) 1 % GEL APPLY TWO grams topically FOUR TIMES DAILY   enalapril (VASOTEC) 10 mg, Oral, Daily   famotidine (PEPCID) 20 mg, Oral, 2 times daily   gentamicin ointment (GARAMYCIN) 0.1 % 1 Application, Topical, 3 times daily   glucose blood (ACCU-CHEK AVIVA PLUS) test strip USE  AS DIRECTED EVERY DAY   ibuprofen (ADVIL) 200 mg, Oral, Every 6 hours PRN   INVOKANA 300 MG TABS tablet TAKE ONE TABLET BY MOUTH EVERY DAY ** BEFORE breakfast **   Lancets (FREESTYLE) lancets Use as instructed   meclizine (ANTIVERT) 25 mg, Oral, 3 times daily PRN   metFORMIN (GLUCOPHAGE) 1,000 mg, Oral, 2 times daily with meals   mupirocin ointment (BACTROBAN) 2 % APPLY TO THE AFFECTED AREA(S) EVERY DAY   naltrexone (DEPADE) 50 mg, Oral, Daily   nicotine (GNP NICOTINE) 14 mg/24hr patch APPLY ONE PATCH onto THE SKIN EVERY DAY   pantoprazole (PROTONIX) 40 mg, Oral, Daily   sildenafil (VIAGRA) 50 mg, Oral, As needed   STIOLTO RESPIMAT 2.5-2.5 MCG/ACT AERS INHALE TWO puffs into THE lungs EVERY DAY   varenicline (CHANTIX) 1 mg, Oral, 2 times daily   Unable to confirm medication list   Past Medical History  Past Surgical History:  Procedure Laterality Date   BRAIN SURGERY  1999   Head injury, fell from tree 25 feet   BUNIONECTOMY Right 2014   Social:  Lives With: sister, has a legal guardian Occupation: Support: Level of Function: PCP: [facility] Substances: Smokes cigarettes and marijuana   Family History:   Allergies: Allergies as of 12/13/2023   (No Known Allergies)   Review of Systems: A complete ROS was negative except  as per HPI.   OBJECTIVE:   Physical Exam: Blood pressure 127/85, pulse 99, temperature (!) 97.5 F (36.4 C), temperature source Axillary, resp. rate 20, SpO2 99%.  Constitutional: chronically ill appearing, slurred speech, responds to questions and follows commands Cardiovascular: regular rate and rhythm, no m/r/g Pulmonary/Chest: normal work of breathing on room air, diffuse crackles in bilateral lung fields Abdominal: soft, non-tender, non-distended MSK: normal bulk and tone Neurological: alert and oriented to self, city (but not place), and not time  Skin: warm and dry Psych: calm, had one shouting outburst  Labs: CBC    Component Value Date/Time    WBC 12.3 (H) 12/13/2023 1539   RBC 5.22 12/13/2023 1539   HGB 16.0 12/13/2023 1545   HGB 13.7 05/03/2023 1639   HCT 47.0 12/13/2023 1545   HCT 40.3 05/03/2023 1639   PLT 218 12/13/2023 1539   PLT 322 05/03/2023 1639   MCV 82.6 12/13/2023 1539   MCV 80 05/03/2023 1639   MCH 26.8 12/13/2023 1539   MCHC 32.5 12/13/2023 1539   RDW 14.7 12/13/2023 1539   RDW 15.2 05/03/2023 1639   LYMPHSABS 2.0 12/13/2023 1539   MONOABS 0.7 12/13/2023 1539   EOSABS 0.0 12/13/2023 1539   BASOSABS 0.0 12/13/2023 1539     CMP     Component Value Date/Time   NA 137 12/13/2023 1545   NA 136 05/03/2023 1639   K 4.0 12/13/2023 1545   CL 101 12/13/2023 1539   CO2 22 12/13/2023 1539   GLUCOSE 112 (H) 12/13/2023 1539   BUN 11 12/13/2023 1539   BUN 13 05/03/2023 1639   CREATININE 1.12 12/13/2023 1539   CALCIUM 9.1 12/13/2023 1539   PROT 6.9 12/13/2023 1539   PROT 7.0 05/03/2023 1639   ALBUMIN 4.1 12/13/2023 1539   ALBUMIN 4.5 05/03/2023 1639   AST 67 (H) 12/13/2023 1539   ALT 74 (H) 12/13/2023 1539   ALKPHOS 100 12/13/2023 1539   BILITOT 0.6 12/13/2023 1539   BILITOT <0.2 05/03/2023 1639   GFRNONAA >60 12/13/2023 1539   GFRAA 100 09/15/2020 1356    ASSESSMENT & PLAN:   Assessment & Plan by Problem: Principal Problem:   Altered mental status  Jakhi Dishman is a 58 y.o. person living with a history of COPD, AUD who was obtunded on presentation and admitted for altered mental status on hospital day 0  Altered mental status Hx drug abuse CMP unremarkable. Afebrile and no leukocytosis. CT head negative. Utox positive only for THC, though high suspicion for drug use. Received 0.4mg  of narcan in the ED with improvement in mental status. He takes amitriptyline for sleep. No evidence of wide-complex QRS or other signs of TCA overdose. Passed bedside swallow.  - Respiratory panel ordered - Other supportive care as needed, though patient has not been requiring supplemental oxygen  Alcohol use  disorder Per chart review patient has been drinking up to 12 beers per day. Unknown how much he is currently drinking - CIWA without Ativan - Thiamine and folate supplementation  Elevated AST, ALT AST 67, ALT 74. Pattern not consistent with alcohol use. Unsure etiology. Trend liver enzymes.   HTN Blood pressure currently stable, holding home amlodipine and enalapril  T2DM Holding home metformin   Diet: Regular diet VTE: Enoxaparin IVF: None,None Code: DNR/DNI  Prior to Admission Living Arrangement: Home, living with sister Anticipated Discharge Location: Home Barriers to Discharge: altered mental status  Dispo: Admit patient to Inpatient with expected length of stay greater than 2 midnights.  Signed: Monna Fam, MD Internal Medicine Resident PGY-1  12/13/2023, 8:10 PM

## 2023-12-14 DIAGNOSIS — T50904A Poisoning by unspecified drugs, medicaments and biological substances, undetermined, initial encounter: Secondary | ICD-10-CM | POA: Diagnosis present

## 2023-12-14 DIAGNOSIS — R7989 Other specified abnormal findings of blood chemistry: Secondary | ICD-10-CM | POA: Diagnosis not present

## 2023-12-14 DIAGNOSIS — G9340 Encephalopathy, unspecified: Principal | ICD-10-CM | POA: Diagnosis present

## 2023-12-14 DIAGNOSIS — R4182 Altered mental status, unspecified: Secondary | ICD-10-CM | POA: Diagnosis not present

## 2023-12-14 DIAGNOSIS — F1090 Alcohol use, unspecified, uncomplicated: Secondary | ICD-10-CM | POA: Diagnosis not present

## 2023-12-14 LAB — MAGNESIUM: Magnesium: 1.6 mg/dL — ABNORMAL LOW (ref 1.7–2.4)

## 2023-12-14 LAB — CBC
HCT: 42.8 % (ref 39.0–52.0)
Hemoglobin: 14.2 g/dL (ref 13.0–17.0)
MCH: 27 pg (ref 26.0–34.0)
MCHC: 33.2 g/dL (ref 30.0–36.0)
MCV: 81.4 fL (ref 80.0–100.0)
Platelets: 208 10*3/uL (ref 150–400)
RBC: 5.26 MIL/uL (ref 4.22–5.81)
RDW: 14.7 % (ref 11.5–15.5)
WBC: 8.1 10*3/uL (ref 4.0–10.5)
nRBC: 0 % (ref 0.0–0.2)

## 2023-12-14 LAB — BASIC METABOLIC PANEL
Anion gap: 12 (ref 5–15)
BUN: 9 mg/dL (ref 6–20)
CO2: 25 mmol/L (ref 22–32)
Calcium: 9.1 mg/dL (ref 8.9–10.3)
Chloride: 100 mmol/L (ref 98–111)
Creatinine, Ser: 1.08 mg/dL (ref 0.61–1.24)
GFR, Estimated: >60 mL/min (ref >60)
Glucose, Bld: 109 mg/dL — ABNORMAL HIGH (ref 70–99)
Potassium: 3.9 mmol/L (ref 3.5–5.1)
Sodium: 137 mmol/L (ref 135–145)

## 2023-12-14 LAB — HEPATITIS PANEL, ACUTE
HCV Ab: NONREACTIVE
Hep A IgM: NONREACTIVE
Hep B C IgM: NONREACTIVE
Hepatitis B Surface Ag: NONREACTIVE

## 2023-12-14 LAB — HEMOGLOBIN A1C
Hgb A1c MFr Bld: 6.6 % — ABNORMAL HIGH (ref 4.8–5.6)
Mean Plasma Glucose: 142.72 mg/dL

## 2023-12-14 LAB — PROTIME-INR
INR: 0.9 (ref 0.8–1.2)
Prothrombin Time: 12.6 s (ref 11.4–15.2)

## 2023-12-14 LAB — HIV ANTIBODY (ROUTINE TESTING W REFLEX): HIV Screen 4th Generation wRfx: NONREACTIVE

## 2023-12-14 MED ORDER — LORAZEPAM 1 MG PO TABS
0.0000 mg | ORAL_TABLET | Freq: Four times a day (QID) | ORAL | Status: DC
  Administered 2023-12-14: 1 mg via ORAL
  Filled 2023-12-14: qty 1

## 2023-12-14 MED ORDER — MAGNESIUM SULFATE 4 GM/100ML IV SOLN
4.0000 g | Freq: Once | INTRAVENOUS | Status: AC
  Administered 2023-12-14: 4 g via INTRAVENOUS
  Filled 2023-12-14 (×2): qty 100

## 2023-12-14 MED ORDER — LORAZEPAM 1 MG PO TABS
1.0000 mg | ORAL_TABLET | ORAL | Status: DC | PRN
  Administered 2023-12-14: 1 mg via ORAL
  Filled 2023-12-14 (×2): qty 1

## 2023-12-14 MED ORDER — LORAZEPAM 2 MG/ML IJ SOLN
1.0000 mg | INTRAMUSCULAR | Status: DC | PRN
  Administered 2023-12-14: 2 mg via INTRAVENOUS
  Filled 2023-12-14 (×3): qty 1

## 2023-12-14 MED ORDER — LORAZEPAM 1 MG PO TABS: 0.0000 mg | ORAL_TABLET | Freq: Two times a day (BID) | ORAL | Status: DC

## 2023-12-14 MED ORDER — LORAZEPAM 2 MG/ML IJ SOLN
2.0000 mg | Freq: Once | INTRAMUSCULAR | Status: AC
  Administered 2023-12-14: 2 mg via INTRAVENOUS

## 2023-12-14 NOTE — Plan of Care (Signed)
  Problem: Education: Goal: Knowledge of General Education information will improve Description: Including pain rating scale, medication(s)/side effects and non-pharmacologic comfort measures Outcome: Not Progressing   Problem: Clinical Measurements: Goal: Ability to maintain clinical measurements within normal limits will improve Outcome: Not Progressing Goal: Will remain free from infection Outcome: Not Progressing Goal: Diagnostic test results will improve Outcome: Not Progressing Goal: Respiratory complications will improve Outcome: Not Progressing Goal: Cardiovascular complication will be avoided Outcome: Not Progressing   

## 2023-12-14 NOTE — Plan of Care (Signed)
  Problem: Clinical Measurements: Goal: Will remain free from infection Outcome: Progressing Goal: Diagnostic test results will improve Outcome: Progressing Goal: Respiratory complications will improve Outcome: Progressing Goal: Cardiovascular complication will be avoided Outcome: Progressing   Problem: Activity: Goal: Risk for activity intolerance will decrease Outcome: Progressing   Problem: Nutrition: Goal: Adequate nutrition will be maintained Outcome: Progressing   Problem: Elimination: Goal: Will not experience complications related to bowel motility Outcome: Progressing Goal: Will not experience complications related to urinary retention Outcome: Progressing   Problem: Pain Managment: Goal: General experience of comfort will improve and/or be controlled Outcome: Progressing   Problem: Safety: Goal: Ability to remain free from injury will improve Outcome: Progressing   Problem: Skin Integrity: Goal: Risk for impaired skin integrity will decrease Outcome: Progressing

## 2023-12-14 NOTE — Progress Notes (Signed)
 PT Cancellation Note  Patient Details Name: Creighton Longley MRN: [id number] DOB: [birth year]   Cancelled Treatment:    Reason Eval/Treat Not Completed: Other (comment) (Pt agitated currently. RN asked to hold at this time and to check back tomorrow for appropriateness. Will continue to follow up as able and appropriate.)  Harrel Carina, DPT, CLT  Acute Rehabilitation Services Office: [phone] (Secure chat preferred)   Claudia Desanctis 12/14/2023, 10:53 AM

## 2023-12-14 NOTE — Progress Notes (Addendum)
  Overnight events: Patient had some agitation, CIWA 8.  Started on CIWA with Ativan.  Subjective: Patient is laying in bed comfortably with no acute concerns.  Agitation seems to have subsided.  He slurs his speech, which is very difficult to understand.  He confirmed he has a legal guardian who we will try to contact.  Objective:  Vital signs in last 24 hours: Vitals:   12/14/23 0501 12/14/23 0749 12/14/23 1041 12/14/23 1128  BP: (!) 130/94 (!) 127/97 (!) 140/99 (!) 133/92  Pulse: 83 86  97  Resp: 18 18 18 18   Temp: 97.7 F (36.5 C) 98 F (36.7 C) 97.6 F (36.4 C) 97.6 F (36.4 C)  TempSrc: Axillary Oral Oral Oral  SpO2: 100% 93% 97% 98%   Physical Exam: Constitutional: Comfortable, laying in bed in no acute distress Cardio: Regular rate and rhythm, no murmurs Pulm: Diffuse wheezing bilaterally, normal respiratory rate and effort Abdomen: Soft, nontender, nondistended Skin: No lesions or skin changes Neuro: Alert, slurred speech, difficult to understand; unclear baseline; no other new focal deficits noted.   Labs: Low magnesium CBC normal Hgb A1c 6.6  Assessment/Plan: Principal Problem:   Altered mental status  Altered mental status Alcohol use disorder Given history, suspect alcohol intoxication +/- ingestion of prescription medications. UDS positive for THC. No ethanol level. Patient says he drinks a 12 pack a day. Unclear what he took yesterday, perhaps alcohol and Xanax. Mental status is improved, unclear baseline. We will try to get in touch with his guardian today.  - CIWA with ativan - Thiamine, multivitamins, folate - TOC consult - Naltrexone at discharge  Hypomagnesemia Magnesium 1.6. Repleted.   Elevated Liver Enzymes AST 67, ALT 74. Hepatitis panel ordered. Most likely metabolic hepatosteatosis vs alcoholic hepatitis.   Hypertension Blood pressure well-controlled. Holding amlodipine, enalapril.   Type 2 Diabetes Mellitus A1c 6.6. Holding metformin.    Diet: Normal IVF: None VTE: Enoxaparin Code: Full TOC recs: pending   Dispo: Anticipated discharge to  Home vs SNF  pending medical stability and safe dispo plan.   Annett Fabian, MD 12/14/2023, 12:01 PM Pager: [phone] After 5pm on weekdays and 1pm on weekends: On Call pager [phone]

## 2023-12-14 NOTE — Progress Notes (Signed)
 Sherlon Handing, DO and team notfied ativan given a 3rd time.  Awaiting bedside eval.

## 2023-12-14 NOTE — Care Management (Signed)
 Transition of Care [facility]) - Inpatient Brief Assessment   Patient Details  Name: Ruben Pierce MRN: [id number] Date of Birth: 12-Jun-1966  [facility] CM/SW Contact:    Lockie Pares, RN Phone Number: 12/14/2023, 10:39 AM   Clinical Narrative:  Patient presents with EMS obtunded, family found him in his room with AMS, empty pill bottles with labels off. Positive only for THC. Patient does drink daily, on CIWA protocol, increasing in agitation today. Will need SA resources SDOH only partially complete will review when patient more awake and collaborative.  TOC will follow for any identified needs  Transition of Care Asessment: Insurance and Status: Insurance coverage has been reviewed Patient has primary care physician: Yes Home environment has been reviewed: Lives with sister Prior level of function:: Indepedent Prior/Current Home Services: No current home services Social Drivers of Health Review: SDOH reviewed no interventions necessary Readmission risk has been reviewed: Yes Transition of care needs: no transition of care needs at this time

## 2023-12-14 NOTE — Progress Notes (Addendum)
 OT Cancellation Note  Patient Details Name: Ruben Pierce MRN: [id number] DOB: Jan 31, 1966   Cancelled Treatment:    Reason Eval/Treat Not Completed: Other (comment) (Per RN patient is agitated and in process of getting ativan and a sitter in place.  Will follow an initate OT when appropriate)  Malachi Bonds 12/14/2023, 9:58 AM Hal Neer OTR/L

## 2023-12-14 NOTE — Significant Event (Signed)
 Rapid Response Event Note   Reason for Call :  Alcohol withdrawal symptoms  Initial Focused Assessment:  Pt lying in bed, opens eyes to voice. Speech unclear, disoriented. Pt having intermittent episodes of thrashing in bed. Extremities equally strong. Tremor noted in arms when extended. Paroxysmal sweats, bed sheet behind his head is wet. Agitated and restless. Pupils 3mm, sluggish.   VS: T 98.60F, BP 137/82, HR 103, RR 19, SpO2 98% on room air  Interventions:  -Ativan 2mg  IV x2, see MAR  Plan of Care:  -CIWA [place] & Ativan per order   Event Summary:  MD Notified: A. Allena Katz, DO Call Time: 1417 Arrival Time: 1425 End Time: 1530  Jennye Moccasin, RN

## 2023-12-15 DIAGNOSIS — F1090 Alcohol use, unspecified, uncomplicated: Secondary | ICD-10-CM | POA: Diagnosis not present

## 2023-12-15 DIAGNOSIS — R4182 Altered mental status, unspecified: Secondary | ICD-10-CM | POA: Diagnosis not present

## 2023-12-15 DIAGNOSIS — R7989 Other specified abnormal findings of blood chemistry: Secondary | ICD-10-CM | POA: Diagnosis not present

## 2023-12-15 LAB — MAGNESIUM: Magnesium: 1.9 mg/dL (ref 1.7–2.4)

## 2023-12-15 MED ORDER — CHLORDIAZEPOXIDE HCL 25 MG PO CAPS: 25.0000 mg | ORAL_CAPSULE | Freq: Four times a day (QID) | ORAL | Status: AC | PRN

## 2023-12-15 MED ORDER — ONDANSETRON 4 MG PO TBDP: 4.0000 mg | ORAL_TABLET | Freq: Four times a day (QID) | ORAL | Status: AC | PRN

## 2023-12-15 MED ORDER — LOPERAMIDE HCL 2 MG PO CAPS: 2.0000 mg | ORAL_CAPSULE | ORAL | Status: AC | PRN

## 2023-12-15 MED ORDER — CHLORDIAZEPOXIDE HCL 25 MG PO CAPS
25.0000 mg | ORAL_CAPSULE | Freq: Four times a day (QID) | ORAL | Status: AC
  Administered 2023-12-15: 25 mg via ORAL
  Filled 2023-12-15: qty 1

## 2023-12-15 MED ORDER — CHLORDIAZEPOXIDE HCL 25 MG PO CAPS
25.0000 mg | ORAL_CAPSULE | Freq: Every day | ORAL | Status: AC
  Administered 2023-12-18: 25 mg via ORAL
  Filled 2023-12-15 (×2): qty 1

## 2023-12-15 MED ORDER — CHLORDIAZEPOXIDE HCL 25 MG PO CAPS
25.0000 mg | ORAL_CAPSULE | Freq: Three times a day (TID) | ORAL | Status: AC
  Administered 2023-12-16 (×3): 25 mg via ORAL
  Filled 2023-12-15 (×3): qty 1

## 2023-12-15 MED ORDER — HYDROXYZINE HCL 25 MG PO TABS: 25.0000 mg | ORAL_TABLET | Freq: Four times a day (QID) | ORAL | Status: AC | PRN

## 2023-12-15 MED ORDER — CHLORDIAZEPOXIDE HCL 25 MG PO CAPS
25.0000 mg | ORAL_CAPSULE | ORAL | Status: AC
  Administered 2023-12-17 (×2): 25 mg via ORAL
  Filled 2023-12-15 (×2): qty 1

## 2023-12-15 NOTE — Progress Notes (Signed)
 Updated patients sister Junious Dresser via phone.

## 2023-12-15 NOTE — Plan of Care (Signed)
  Problem: Acute Rehab PT Goals(only PT should resolve) Goal: Pt Will Go Supine/Side To Sit Flowsheets (Taken 12/15/2023 1311) Pt will go Supine/Side to Sit: with supervision Goal: Patient Will Transfer Sit To/From Stand Flowsheets (Taken 12/15/2023 1311) Patient will transfer sit to/from stand: with contact guard assist Goal: Pt Will Ambulate Flowsheets (Taken 12/15/2023 1311) Pt will Ambulate:  100 feet  with contact guard assist  with least restrictive assistive device Goal: Pt Will Verbalize and Adhere to Precautions While Description: PT Will Verbalize and Adhere to Precautions While Performing Mobility Flowsheets (Taken 12/15/2023 1311) Pt will verbalize and adhere to precautions while performing mobility: (Safety when ambulating, improving balance in order to limit fall risk) --

## 2023-12-15 NOTE — Progress Notes (Signed)
 Discharge instructions (including medications) discussed with and copy provided to patient/family. Patient and family given opportunity to ask questions. Questions clarified.

## 2023-12-15 NOTE — Plan of Care (Signed)
 Pt remains lethargic and has not been able to actively participate in his care plan.

## 2023-12-15 NOTE — Progress Notes (Signed)
  Overnight events: Patient with CIWA 4. No acute concerns.   Subjective: Patient is laying in bed comfortably.  Arousable.  Still remains a little somnolent from the Ativan, but otherwise seems to nearing his baseline.  Says he does not feel well but is unable to characterize this.  He says he lives at home with his sister and brother.  Objective:  Vital signs in last 24 hours: Vitals:   12/14/23 2351 12/15/23 0200 12/15/23 0811 12/15/23 0817  BP: 139/83 128/84 (!) 136/92   Pulse: 84 87 88 86  Resp: (!) 21 (!) 22 17 17   Temp: 97.7 F (36.5 C) 98.6 F (37 C) (!) 97.5 F (36.4 C)   TempSrc: Axillary Axillary Oral   SpO2: 100% 99% 100% 96%   Physical Exam: Constitutional: Comfortable, laying in bed in no acute distress Cardio: Regular rate and rhythm, no murmurs Pulm: Normal respiratory rate and effort Skin: No lesions or skin changes Neuro: Somnolent but arousable, slurred speech appears to be baseline, difficult to understand.  Oriented to self, not oriented to place or time.  Labs: Magnesium 1.9.  Assessment/Plan: Principal Problem:   Altered mental status Active Problems:   Overdose of undetermined intent   Acute encephalopathy  Altered mental status Alcohol use disorder Went into alcohol withdrawal yesterday and was given IV Ativan 2 mg x 2, in addition to routine CIWA with ativan protocol. Appears to have done well overnight.  Comfortable this morning.  We will try to get in touch with his sister, who seems to be his legal guardian, as we come up with a dispo plan.  Since he is more alert and can swallow pills, we will start a Librium taper. - Librium taper; Librium 25 mg q6h prn - Discontinued CIWA with ativan - Thiamine, multivitamins, folate - Naltrexone at discharge  Hypomagnesemia, resolved Magnesium today 1.9.  Elevated Liver Enzymes AST 67, ALT 74. Hepatitis panel negative. Most likely metabolic hepatosteatosis vs alcoholic hepatitis.   Hypertension Blood  pressure well-controlled. Holding amlodipine, enalapril.   Type 2 Diabetes Mellitus A1c 6.6. Holding metformin.   Diet: Normal IVF: None VTE: Enoxaparin Code: Full TOC recs: pending   Dispo: Anticipated discharge to  Home vs SNF  pending medical stability and safe dispo plan.   Annett Fabian, MD 12/15/2023, 10:38 AM Pager: [phone] After 5pm on weekdays and 1pm on weekends: On Call pager [phone]

## 2023-12-15 NOTE — Progress Notes (Signed)
 Spoke MD briefly about pt and was asked about CIWA score. Order for scoring Q6H, and was due at time of call. MD arrived at bedside and pt was scored with MD present. Pt continues to become more responsive. Will continue to monitor pt progress.

## 2023-12-15 NOTE — Evaluation (Signed)
 Occupational Therapy Evaluation Patient Details Name: Ruben Pierce MRN: [id number] DOB: 23-Aug-1966 Today's Date: 12/15/2023   History of Present Illness   Pt is 58 yo presenting to [facility] ED via EMS after being found obtuned at home on 12/13/23. PMH: DM II, HTN, HLD, COPD, alcohol use disorder, drug abuse     Clinical Impressions Pt admitted for above, unsure of how reliable of a historian pt is but he reports living with his siblings and being ind in ADLs/iADLs. Pt currently A&O to self and needing mod A to transfer OOB, able to complete Adls with Min A to CGA with occasional cues needed to sequence. OT to continue following pt acutely to address listed deficits and help transition to next level of care. Patient would benefit from post acute skilled rehab facility with <3 hours of therapy and 24/7 support      If plan is discharge home, recommend the following:   A little help with bathing/dressing/bathroom;A lot of help with walking and/or transfers;Assistance with cooking/housework;Supervision due to cognitive status;Direct supervision/assist for medications management;Assist for transportation;Direct supervision/assist for financial management     Functional Status Assessment   Patient has had a recent decline in their functional status and demonstrates the ability to make significant improvements in function in a reasonable and predictable amount of time.     Equipment Recommendations   Other (comment) (RW)     Recommendations for Other Services         Precautions/Restrictions   Precautions Precautions: Fall Recall of Precautions/Restrictions: Impaired Restrictions Weight Bearing Restrictions Per Provider Order: No     Mobility Bed Mobility Overal bed mobility: Needs Assistance Bed Mobility: Supine to Sit     Supine to sit: Contact guard     General bed mobility comments: increased time but no physical assist needed    Transfers Overall transfer level:  Needs assistance Equipment used: 1 person hand held assist Transfers: Sit to/from Stand Sit to Stand: Min assist                  Balance Overall balance assessment: Needs assistance Sitting-balance support: No upper extremity supported, Feet supported Sitting balance-Leahy Scale: Fair     Standing balance support: Single extremity supported, During functional activity Standing balance-Leahy Scale: Poor Standing balance comment: relaint on UE support                           ADL either performed or assessed with clinical judgement   ADL Overall ADL's : Needs assistance/impaired Eating/Feeding: Sitting;Set up   Grooming: Sitting;Contact guard assist;Wash/dry hands   Upper Body Bathing: Sitting;Contact guard assist   Lower Body Bathing: Sitting/lateral leans;Minimal assistance   Upper Body Dressing : Sitting;Moderate assistance;Cueing for sequencing Upper Body Dressing Details (indicate cue type and reason): don shirt Lower Body Dressing: Minimal assistance;Sitting/lateral leans Lower Body Dressing Details (indicate cue type and reason): don socks Toilet Transfer: Moderate assistance;Stand-pivot;BSC/3in1   Toileting- Clothing Manipulation and Hygiene: Sit to/from stand;Moderate assistance;+2 for physical assistance       Functional mobility during ADLs: Moderate assistance (pivot to/from)       Vision         Perception         Praxis         Pertinent Vitals/Pain Pain Assessment Pain Assessment: No/denies pain     Extremity/Trunk Assessment Upper Extremity Assessment Upper Extremity Assessment: Overall WFL for tasks assessed   Lower Extremity Assessment Lower Extremity Assessment:  Defer to PT evaluation       Communication Communication Communication: No apparent difficulties   Cognition Arousal: Alert Behavior During Therapy: WFL for tasks assessed/performed Cognition: Cognition impaired   Orientation impairments: Place,  Time, Situation Awareness: Intellectual awareness impaired, Online awareness impaired Memory impairment (select all impairments): Working [employer] fast streamer, Conservation officer, historic buildings, Short-term memory Attention impairment (select first level of impairment): Focused attention Executive functioning impairment (select all impairments): Reasoning, Problem solving, Sequencing                   Following commands: Impaired Following commands impaired: Follows one step commands with increased time     Cueing  General Comments   Cueing Techniques: Verbal cues;Gestural cues  VSS on RA   Exercises     Shoulder Instructions      Home Living Family/patient expects to be discharged to:: Private residence Living Arrangements: Other relatives (brother and sister) Available Help at Discharge: Family Type of Home: House       Home Layout: Two level     Bathroom Shower/Tub: Chief Strategy Officer: Standard     Home Equipment: Agricultural consultant (2 wheels);Rollator (4 wheels);Cane - single point;Crutches          Prior Functioning/Environment Prior Level of Function : Independent/Modified Independent                    OT Problem List: Decreased cognition;Impaired balance (sitting and/or standing);Decreased safety awareness   OT Treatment/Interventions: Self-care/ADL training;Balance training;Therapeutic exercise;Therapeutic activities;Patient/family education      OT Goals(Current goals can be found in the care plan section)   Acute Rehab OT Goals Patient Stated Goal: To get ice cream OT Goal Formulation: With patient Time For Goal Achievement: 12/29/23 Potential to Achieve Goals: Good ADL Goals Pt Will Perform Grooming: (P) with supervision;standing Pt Will Perform Lower Body Bathing: (P) with set-up;sit to/from stand Pt Will Perform Lower Body Dressing: (P) with supervision;sit to/from stand Pt Will Transfer to Toilet: (P) with supervision;ambulating Pt  Will Perform Tub/Shower Transfer: (P) Tub transfer;with supervision Additional ADL Goal #3: (P) Pt will complete 3 step iADL task with setup assist   OT Frequency:  Min 2X/week    Co-evaluation              AM-PAC OT "6 Clicks" Daily Activity     Outcome Measure Help from another person eating meals?: A Little Help from another person taking care of personal grooming?: A Little Help from another person toileting, which includes using toliet, bedpan, or urinal?: A Lot Help from another person bathing (including washing, rinsing, drying)?: A Little Help from another person to put on and taking off regular upper body clothing?: A Little Help from another person to put on and taking off regular lower body clothing?: A Little 6 Click Score: 17   End of Session Equipment Utilized During Treatment: Gait belt Nurse Communication: Mobility status  Activity Tolerance: Patient tolerated treatment well Patient left: in chair;with call bell/phone within reach;with chair alarm set  OT Visit Diagnosis: Unsteadiness on feet (R26.81);Other abnormalities of gait and mobility (R26.89);Other symptoms and signs involving cognitive function                Time: 1000-1030 OT Time Calculation (min): 30 min Charges:  OT General Charges $OT Visit: 1 Visit OT Evaluation $OT Eval Moderate Complexity: 1 Mod OT Treatments $Therapeutic Activity: 8-22 mins  12/15/2023  AB, OTR/L  Acute Rehabilitation Services  Office: [phone]  Tristan Schroeder 12/15/2023, 12:10 PM

## 2023-12-15 NOTE — Plan of Care (Signed)
  Problem: Education: Goal: Knowledge of General Education information will improve Description: Including pain rating scale, medication(s)/side effects and non-pharmacologic comfort measures Outcome: Not Progressing   Problem: Health Behavior/Discharge Planning: Goal: Ability to manage health-related needs will improve Outcome: Progressing   Problem: Clinical Measurements: Goal: Ability to maintain clinical measurements within normal limits will improve Outcome: Progressing Goal: Will remain free from infection Outcome: Progressing Goal: Diagnostic test results will improve Outcome: Progressing Goal: Respiratory complications will improve Outcome: Progressing Goal: Cardiovascular complication will be avoided Outcome: Progressing   Problem: Activity: Goal: Risk for activity intolerance will decrease Outcome: Progressing   Problem: Nutrition: Goal: Adequate nutrition will be maintained Outcome: Progressing   Problem: Coping: Goal: Level of anxiety will decrease Outcome: Progressing   Problem: Elimination: Goal: Will not experience complications related to bowel motility Outcome: Progressing Goal: Will not experience complications related to urinary retention Outcome: Progressing   Problem: Pain Managment: Goal: General experience of comfort will improve and/or be controlled Outcome: Progressing   Problem: Safety: Goal: Ability to remain free from injury will improve Outcome: Progressing   Problem: Skin Integrity: Goal: Risk for impaired skin integrity will decrease Outcome: Progressing

## 2023-12-15 NOTE — Evaluation (Signed)
 Physical Therapy Evaluation Patient Details Name: Ruben Pierce MRN: [id number] DOB: 1966/03/17 Today's Date: 12/15/2023  History of Present Illness  Pt is 58 yo presenting to [facility] ED via EMS after being found obtuned at home on 12/13/23. PMH: DM II, HTN, HLD, COPD, alcohol use disorder, drug abuse  Clinical Impression  Patient exhibits some inconsistencies with recalling home living conditions based on OT/PT consult, unsure of how reliable of a historian the patient is. Based on the PT consult, patient lives with his siblings and is independent with ADLs/IADLs. Patient is A&O for self and location, but not time or date. Upon entry, patient was seen sitting in his recliner and requires MinA for sit > stand transfer due to unsteadiness when initially standing. Patient requires ModA +2 for safety when ambulating with HHA +2 due to his impaired dynamic balance. Patient is a limited household ambulator and is a high fall risk due to his shuffled gait pattern and narrow BOS when ambulating. Patient would benefit from post acute skilled rehab facility with <3 hours of therapy and 24/7 support. Acute PT will follow up. Thanks for this consult.       If plan is discharge home, recommend the following: A little help with walking and/or transfers;Help with stairs or ramp for entrance;Assist for transportation   Can travel by private vehicle   Yes    Equipment Recommendations None recommended by PT  Recommendations for Other Services       Functional Status Assessment Patient has had a recent decline in their functional status and demonstrates the ability to make significant improvements in function in a reasonable and predictable amount of time.     Precautions / Restrictions Precautions Precautions: Fall Recall of Precautions/Restrictions: Impaired Restrictions Weight Bearing Restrictions Per Provider Order: No      Mobility  Bed Mobility                    Transfers Overall  transfer level: Needs assistance Equipment used: None Transfers: Sit to/from Stand Sit to Stand: Min assist           General transfer comment: MinA to assist with sit > stand transfer, patient demonstrates some unsteadiness for initial stand    Ambulation/Gait Ambulation/Gait assistance: Mod assist, +2 safety/equipment Gait Distance (Feet): 75 Feet Assistive device: 2 person hand held assist Gait Pattern/deviations: Shuffle, Staggering left, Staggering right, Narrow base of support Gait velocity: Decreased     General Gait Details: Moderate amount of unsteadiness when ambulating with HHA+2, cues required to decrease shuffling and widen base of support  Stairs            Wheelchair Mobility     Tilt Bed    Modified Rankin (Stroke Patients Only)       Balance Overall balance assessment: Needs assistance, History of Falls Sitting-balance support: Feet supported Sitting balance-Leahy Scale: Normal     Standing balance support: Bilateral upper extremity supported Standing balance-Leahy Scale: Poor Standing balance comment: Unsteadiness statically and dynamically                             Pertinent Vitals/Pain Pain Assessment Pain Assessment: No/denies pain    Home Living Family/patient expects to be discharged to:: Private residence Living Arrangements: Other relatives (brother and sister) Available Help at Discharge: Family Type of Home: House         Home Layout: Two level Home Equipment: Agricultural consultant (2 wheels);Rollator (4 wheels);Cane -  single point;Crutches      Prior Function Prior Level of Function : Independent/Modified Independent                     Extremity/Trunk Assessment   Upper Extremity Assessment Upper Extremity Assessment: Overall WFL for tasks assessed    Lower Extremity Assessment Lower Extremity Assessment: Defer to PT evaluation    Cervical / Trunk Assessment Cervical / Trunk Assessment: Normal   Communication   Communication Communication: No apparent difficulties    Cognition Arousal: Alert Behavior During Therapy: WFL for tasks assessed/performed   PT - Cognitive impairments: Orientation                       PT - Cognition Comments: A&O to self and location, but not time/date Following commands: Impaired Following commands impaired: Follows one step commands with increased time     Cueing Cueing Techniques: Verbal cues, Gestural cues     General Comments General comments (skin integrity, edema, etc.): VSS on RA    Exercises     Assessment/Plan    PT Assessment Patient needs continued PT services  PT Problem List Decreased strength;Decreased activity tolerance;Decreased mobility;Decreased balance;Decreased coordination;Decreased safety awareness       PT Treatment Interventions Gait training;Functional mobility training;Therapeutic activities;Balance training;Neuromuscular re-education    PT Goals (Current goals can be found in the Care Plan section)  Acute Rehab PT Goals Patient Stated Goal: Return to PLOF PT Goal Formulation: With patient Time For Goal Achievement: 12/29/23 Potential to Achieve Goals: Good    Frequency Min 3X/week     Co-evaluation               AM-PAC PT "6 Clicks" Mobility  Outcome Measure Help needed turning from your back to your side while in a flat bed without using bedrails?: A Little Help needed moving from lying on your back to sitting on the side of a flat bed without using bedrails?: A Little Help needed moving to and from a bed to a chair (including a wheelchair)?: A Lot Help needed standing up from a chair using your arms (e.g., wheelchair or bedside chair)?: A Little Help needed to walk in hospital room?: A Lot Help needed climbing 3-5 steps with a railing? : A Lot 6 Click Score: 15    End of Session Equipment Utilized During Treatment: Gait belt Activity Tolerance: Patient tolerated treatment  well Patient left: in chair;with call bell/phone within reach;with chair alarm set   PT Visit Diagnosis: Unsteadiness on feet (R26.81);History of falling (Z91.81);Muscle weakness (generalized) (M62.81)    Time: 8295-6213 PT Time Calculation (min) (ACUTE ONLY): 31 min   Charges:   PT Evaluation $PT Eval Moderate Complexity: 1 Mod PT Treatments $Gait Training: 8-22 mins PT General Charges $$ ACUTE PT VISIT: 1 Visit        Doreen Beam, SPT   Sheldon Amara 12/15/2023, 1:13 PM

## 2023-12-15 NOTE — Progress Notes (Signed)
 Clinical update:  Went to re-evaluate the patient at the bedside as there was not an updated CIWA score since 7p. Upon my assessment, the patient was somnolent and snoring. Vitals were stable: HR 86, BP 128/84, RR 20 satting at 99% on room air. He did not respond to voice or sternal rub initially, but did withdraw all of his extremities to painful stimuli. After a few minutes, he woke up more and started talking, although his speech remains slurred and difficult to understand. Will continue to keep a close eye on this patient - no new orders at this time.

## 2023-12-16 DIAGNOSIS — G9340 Encephalopathy, unspecified: Secondary | ICD-10-CM

## 2023-12-16 DIAGNOSIS — I10 Essential (primary) hypertension: Secondary | ICD-10-CM

## 2023-12-16 DIAGNOSIS — R7401 Elevation of levels of liver transaminase levels: Secondary | ICD-10-CM

## 2023-12-16 DIAGNOSIS — E119 Type 2 diabetes mellitus without complications: Secondary | ICD-10-CM

## 2023-12-16 DIAGNOSIS — F10139 Alcohol abuse with withdrawal, unspecified: Secondary | ICD-10-CM

## 2023-12-16 DIAGNOSIS — E871 Hypo-osmolality and hyponatremia: Secondary | ICD-10-CM

## 2023-12-16 LAB — CBC
HCT: 43.7 % (ref 39.0–52.0)
Hemoglobin: 14.5 g/dL (ref 13.0–17.0)
MCH: 27 pg (ref 26.0–34.0)
MCHC: 33.2 g/dL (ref 30.0–36.0)
MCV: 81.4 fL (ref 80.0–100.0)
Platelets: 249 10*3/uL (ref 150–400)
RBC: 5.37 MIL/uL (ref 4.22–5.81)
RDW: 14.5 % (ref 11.5–15.5)
WBC: 8.7 10*3/uL (ref 4.0–10.5)
nRBC: 0 % (ref 0.0–0.2)

## 2023-12-16 LAB — COMPREHENSIVE METABOLIC PANEL
ALT: 100 U/L — ABNORMAL HIGH (ref 0–44)
AST: 54 U/L — ABNORMAL HIGH (ref 15–41)
Albumin: 3.5 g/dL (ref 3.5–5.0)
Alkaline Phosphatase: 106 U/L (ref 38–126)
Anion gap: 9 (ref 5–15)
BUN: 14 mg/dL (ref 6–20)
CO2: 28 mmol/L (ref 22–32)
Calcium: 9.2 mg/dL (ref 8.9–10.3)
Chloride: 97 mmol/L — ABNORMAL LOW (ref 98–111)
Creatinine, Ser: 1.08 mg/dL (ref 0.61–1.24)
GFR, Estimated: >60 mL/min (ref >60)
Glucose, Bld: 164 mg/dL — ABNORMAL HIGH (ref 70–99)
Potassium: 3.8 mmol/L (ref 3.5–5.1)
Sodium: 134 mmol/L — ABNORMAL LOW (ref 135–145)
Total Bilirubin: 0.6 mg/dL (ref 0.0–1.2)
Total Protein: 6.7 g/dL (ref 6.5–8.1)

## 2023-12-16 NOTE — Plan of Care (Signed)
   Problem: Education: Goal: Knowledge of General Education information will improve Description: Including pain rating scale, medication(s)/side effects and non-pharmacologic comfort measures Outcome: Progressing   Problem: Health Behavior/Discharge Planning: Goal: Ability to manage health-related needs will improve Outcome: Progressing   Problem: Activity: Goal: Risk for activity intolerance will decrease Outcome: Progressing

## 2023-12-16 NOTE — Progress Notes (Signed)
 HD#3 Subjective:   Summary: This is a 58 year old male with past medical history of type 2 diabetes mellitus, hypertension, alcohol use disorder who presents with concerns of altered mental status.  Patient admitted for acute intoxication as well as alcohol withdrawal.  Overnight Events: No acute events overnight  Patient evaluated bedside this morning. Reports that he stays with his brother and sister, they do not get along. Reports he drinks 12 pack of beers a day. He is oriented self and year. He is unable to recall what brought him in ot the hospital. Otherwise, reports that he is doing well and is ready to go home. He states that he thought about quitting alcohol.  Objective:  Vital signs in last 24 hours: Vitals:   12/15/23 1942 12/15/23 2029 12/15/23 2353 12/16/23 0354  BP: 135/81  104/77 (!) 127/90  Pulse: 98  91 87  Resp: 18  18 18   Temp: 98 F (36.7 C)  99.5 F (37.5 C) 98.7 F (37.1 C)  TempSrc: Oral  Oral Oral  SpO2: 99% 98% 96% 98%   Supplemental O2: Room Air SpO2: 98 %   Physical Exam:  Constitutional: Sleeping in bed, no acute distress HENT: normocephalic atraumatic Cardiovascular: regular rate and rhythm, no m/r/g Pulmonary/Chest: normal work of breathing on room air, lungs clear to auscultation bilaterally Abdominal: soft, non-tender, non-distended MSK: normal bulk and tone Neurological: Alert and oriented x 3, able to follow all instructions  There were no vitals filed for this visit.   Intake/Output Summary (Last 24 hours) at 12/16/2023 0828 Last data filed at 12/15/2023 2100 Gross per 24 hour  Intake 1440 ml  Output 3300 ml  Net -1860 ml   Net IO Since Admission: -2,033.19 mL [12/16/23 0828]  Pertinent Labs:    Latest Ref Rng & Units 12/16/2023    6:25 AM 12/14/2023    6:54 AM 12/13/2023    3:45 PM  CBC  WBC 4.0 - 10.5 K/uL 8.7  8.1    Hemoglobin 13.0 - 17.0 g/dL 16.1  09.6  04.5   Hematocrit 39.0 - 52.0 % 43.7  42.8  47.0   Platelets 150  - 400 K/uL 249  208         Latest Ref Rng & Units 12/16/2023    6:25 AM 12/14/2023    6:54 AM 12/13/2023    3:45 PM  CMP  Glucose 70 - 99 mg/dL 409  811    BUN 6 - 20 mg/dL 14  9    Creatinine 9.14 - 1.24 mg/dL 7.82  9.56    Sodium 213 - 145 mmol/L 134  137  137   Potassium 3.5 - 5.1 mmol/L 3.8  3.9  4.0   Chloride 98 - 111 mmol/L 97  100    CO2 22 - 32 mmol/L 28  25    Calcium 8.9 - 10.3 mg/dL 9.2  9.1    Total Protein 6.5 - 8.1 g/dL 6.7     Total Bilirubin 0.0 - 1.2 mg/dL 0.6     Alkaline Phos 38 - 126 U/L 106     AST 15 - 41 U/L 54     ALT 0 - 44 U/L 100       Imaging: No results found.  Assessment/Plan:   Principal Problem:   Altered mental status Active Problems:   Overdose of undetermined intent   Acute encephalopathy   Patient Summary: Ruben Pierce is a 58 y.o. male with past medical history of type 2  diabetes mellitus, hypertension, alcohol use disorder who presents with concerns of altered mental status.  Patient admitted for acute intoxication as well as alcohol withdrawal.   #Acute encephalopathy secondary to alcohol withdrawal, resolving #Alcohol use disorder Patient is improving tremendously.  He is alert and oriented x 3 today.  He does admit to drinking 12 beers per day.  He states he is ready to quit drinking.  He is agreeable to naltrexone.  Currently on Librium taper.  Can transition to naltrexone afterwards.  PT/OT evaluated patient yesterday and recommending SNF.  Will screen patient for staff. -Continue Librium taper -CIWA scoring -Transition to naltrexone after Librium taper is over -Will update patient's sister regarding disposition -Consult TOC for SNF placement -PT/OT following  #Hyponatremia Likely secondary to decreased p.o. intake. -Encourage p.o. intake  #Hypertension Currently normotensive with blood pressure 127/90.  No acute concerns at this time. -Continue home amlodipine 10 mg daily -Continue enalapril 10 mg daily  #Elevated  liver enzymes This is likely secondary to alcohol use.  Interesting enough, patient has increased ALT compared to AST. -Continue to monitor  #Type 2 diabetes mellitus Most recent A1c on 12/14/23 6.6. -At goal  -Holding home metformin  Diet: Normal IVF: None,None VTE: Enoxaparin Code: DNR/DNI PT/OT recs: SNF for Subacute PT.  Dispo: Anticipated discharge to Skilled nursing facility in 3 days pending SNF placement.   Modena Slater DO Internal Medicine Resident PGY-2 [phone] Please contact the on call pager after 5 pm and on weekends at [phone].

## 2023-12-17 ENCOUNTER — Other Ambulatory Visit: Payer: Self-pay | Admitting: Internal Medicine

## 2023-12-17 DIAGNOSIS — F17210 Nicotine dependence, cigarettes, uncomplicated: Secondary | ICD-10-CM

## 2023-12-17 DIAGNOSIS — R4182 Altered mental status, unspecified: Secondary | ICD-10-CM | POA: Diagnosis not present

## 2023-12-17 DIAGNOSIS — J449 Chronic obstructive pulmonary disease, unspecified: Secondary | ICD-10-CM

## 2023-12-17 DIAGNOSIS — F1090 Alcohol use, unspecified, uncomplicated: Secondary | ICD-10-CM | POA: Diagnosis not present

## 2023-12-17 MED ORDER — NICOTINE 21 MG/24HR TD PT24
21.0000 mg | MEDICATED_PATCH | Freq: Every day | TRANSDERMAL | Status: DC
  Administered 2023-12-17 – 2023-12-19 (×3): 21 mg via TRANSDERMAL
  Filled 2023-12-17 (×3): qty 1

## 2023-12-17 MED ORDER — METFORMIN HCL 500 MG PO TABS
1000.0000 mg | ORAL_TABLET | Freq: Two times a day (BID) | ORAL | Status: DC
  Administered 2023-12-17 – 2023-12-19 (×4): 1000 mg via ORAL
  Filled 2023-12-17 (×4): qty 2

## 2023-12-17 NOTE — TOC Initial Note (Signed)
 Transition of Care [facility]) - Initial/Assessment Note    Patient Details  Name: Ruben Pierce MRN: [id number] Date of Birth: 11/14/1965  Transition of Care [facility]) CM/SW Contact:    Baldemar Lenis, LCSW Phone Number: 12/17/2023, 3:24 PM  Clinical Narrative:       CSW spoke with patient's sister, Junious Dresser, to discuss SNF placement and barriers to finding placement. Junious Dresser appreciative of CSW efforts. CSW asked Junious Dresser about legal guardian, as patient is telling MD that he has a guardian, and patient does not have a legal guardian; he has a payee. Patient lives with family but someone else manages his check for him. CSW faxed out referral for SNF, patient with no bed offers at this time. Will fax out referral further.            Expected Discharge Plan: Skilled Nursing Facility Barriers to Discharge: Continued Medical Work up, English as a second language teacher, Active Substance Use - Placement   Patient Goals and CMS Choice Patient states their goals for this hospitalization and ongoing recovery are:: patient unable to participate in goal setting, not oriented CMS Medicare.gov Compare Post Acute Care list provided to:: Patient Represenative (must comment) Choice offered to / list presented to : Sibling [employer] ownership interest in [facility]provided to:: Sibling    Expected Discharge Plan and Services     Post Acute Care Choice: Skilled Nursing Facility Living arrangements for the past 2 months: Single Family Home                                      Prior Living Arrangements/Services Living arrangements for the past 2 months: Single Family Home Lives with:: Siblings Patient language and need for interpreter reviewed:: No Do you feel safe going back to the place where you live?: Yes      Need for Family Participation in Patient Care: Yes (Comment) Care giver support system in place?: No (comment)   Criminal Activity/Legal Involvement Pertinent to Current  Situation/Hospitalization: No - Comment as needed  Activities of Daily Living      Permission Sought/Granted Permission sought to share information with : Facility Medical sales representative, Family Supports Permission granted to share information with : Yes, Verbal Permission Granted  Share Information with NAME: Junious Dresser  Permission granted to share info w AGENCY: SNF  Permission granted to share info w Relationship: Sister     Emotional Assessment   Attitude/Demeanor/Rapport: Unable to Assess Affect (typically observed): Unable to Assess Orientation: : Oriented to Self, Oriented to Place Alcohol / Substance Use: Alcohol Use Psych Involvement: No (comment)  Admission diagnosis:  Altered mental status [R41.82] Acute encephalopathy [G93.40] Overdose of undetermined intent, initial encounter [T50.904A] Patient Active Problem List   Diagnosis Date Noted   Alcohol use disorder 05/03/2023   Chronic ulcer of right great toe (HCC) 07/06/2022   Leg pain 04/20/2022   Iron deficiency 12/08/2020   Erectile dysfunction 09/15/2020   GERD (gastroesophageal reflux disease) 05/24/2016   Stable angina (HCC) 01/24/2016   COPD (chronic obstructive pulmonary disease) (HCC) 01/24/2016   Current smoker 01/24/2016   Hyperlipidemia associated with type 2 diabetes mellitus (HCC) 11/11/2015   Insomnia 11/11/2015   Type 2 diabetes mellitus (HCC) 11/08/2015   Hypertension associated with diabetes (HCC) 11/08/2015   TBI (traumatic brain injury) (HCC)    PCP:  Rocky Morel, DO Pharmacy:   [facility] - [place], [place] - [address] [phone]  [address] [place] [address] Phone: [phone] Fax: [phone]     Social Drivers of Health (SDOH) Social History: SDOH Screenings   Food Insecurity: Patient Unable To Answer (12/13/2023)  Housing: Patient Unable To Answer (12/13/2023)  Transportation Needs: Patient Unable To Answer (12/13/2023)  Utilities: Patient Unable To  Answer (12/13/2023)  Alcohol Screen: Medium Risk (03/21/2018)  Depression (PHQ2-9): Low Risk  (11/30/2022)  Social Connections: Patient Unable To Answer (12/13/2023)  Tobacco Use: High Risk (12/13/2023)   SDOH Interventions:     Readmission Risk Interventions     No data to display

## 2023-12-17 NOTE — NC FL2 (Signed)
 [place] MEDICAID FL2 LEVEL OF CARE FORM     IDENTIFICATION  Patient Name: Ruben Pierce Birthdate: 09/26/1966 Sex: male Admission Date (Current Location): 12/13/2023  [facility] and [place] Number:  Producer, television/film/video and Address:  [facility] [place], [address] [place], [place] [address]      Provider Number: [id number]  Attending Physician Name and Address:  Dickie La, MD  Relative Name and Phone Number:       Current Level of Care: Hospital Recommended Level of Care: Skilled Nursing Facility Prior Approval Number:    Date Approved/Denied:   PASRR Number: [id number]  Discharge Plan: SNF    Current Diagnoses: Patient Active Problem List   Diagnosis Date Noted   Overdose of undetermined intent 12/14/2023   Acute encephalopathy 12/14/2023   Altered mental status 12/13/2023   Alcohol use disorder 05/03/2023   Chronic ulcer of right great toe (HCC) 07/06/2022   Leg pain 04/20/2022   Iron deficiency 12/08/2020   Erectile dysfunction 09/15/2020   GERD (gastroesophageal reflux disease) 05/24/2016   Stable angina (HCC) 01/24/2016   COPD (chronic obstructive pulmonary disease) (HCC) 01/24/2016   Tobacco abuse 01/24/2016   Hyperlipidemia associated with type 2 diabetes mellitus (HCC) 11/11/2015   Insomnia 11/11/2015   Type 2 diabetes mellitus (HCC) 11/08/2015   Hypertension associated with diabetes (HCC) 11/08/2015   TBI (traumatic brain injury) (HCC)     Orientation RESPIRATION BLADDER Height & Weight     Self, Place  Normal Incontinent Weight:   Height:     BEHAVIORAL SYMPTOMS/MOOD NEUROLOGICAL BOWEL NUTRITION STATUS      Continent Diet (regulra)  AMBULATORY STATUS COMMUNICATION OF NEEDS Skin   Limited Assist Verbally Normal                       Personal Care Assistance Level of Assistance  Bathing, Feeding, Dressing Bathing Assistance: Limited assistance Feeding assistance: Limited assistance Dressing Assistance: Limited  assistance     Functional Limitations Info  Speech, Sight Sight Info: Impaired (glasses)   Speech Info: Impaired (dysarthria)    SPECIAL CARE FACTORS FREQUENCY  PT (By licensed PT), OT (By licensed OT)     PT Frequency: 5x/wk OT Frequency: 5x/wk            Contractures Contractures Info: Not present    Additional Factors Info  Code Status, Allergies Code Status Info: DNR Allergies Info: NKA           Current Medications (12/17/2023):  This is the current hospital active medication list Current Facility-Administered Medications  Medication Dose Route Frequency Provider Last Rate Last Admin   acetaminophen (TYLENOL) tablet 650 mg  650 mg Oral Q6H PRN Atway, Rayann N, DO   650 mg at 12/16/23 1540   Or   acetaminophen (TYLENOL) suppository 650 mg  650 mg Rectal Q6H PRN Atway, Rayann N, DO       amLODipine (NORVASC) tablet 10 mg  10 mg Oral Daily Atway, Rayann N, DO   10 mg at 12/17/23 1037   arformoterol (BROVANA) nebulizer solution 15 mcg  15 mcg Nebulization BID Atway, Rayann N, DO   15 mcg at 12/16/23 1948   And   umeclidinium bromide (INCRUSE ELLIPTA) 62.5 MCG/ACT 1 puff  1 puff Inhalation Daily Atway, Rayann N, DO       chlordiazePOXIDE (LIBRIUM) capsule 25 mg  25 mg Oral Q6H PRN Modena Slater, DO       chlordiazePOXIDE (LIBRIUM) capsule 25  mg  25 mg Oral BH-qamhs Patel, Amar, DO   25 mg at 12/17/23 1037   Followed by   Melene Muller ON 12/18/2023] chlordiazePOXIDE (LIBRIUM) capsule 25 mg  25 mg Oral Daily Modena Slater, DO       enalapril (VASOTEC) tablet 10 mg  10 mg Oral Daily Atway, Rayann N, DO   10 mg at 12/17/23 1037   enoxaparin (LOVENOX) injection 40 mg  40 mg Subcutaneous Q24H Atway, Rayann N, DO   40 mg at 12/16/23 2049   folic acid (FOLVITE) tablet 1 mg  1 mg Oral Daily Atway, Rayann N, DO   1 mg at 12/17/23 1037   hydrOXYzine (ATARAX) tablet 25 mg  25 mg Oral Q6H PRN Modena Slater, DO       loperamide (IMODIUM) capsule 2-4 mg  2-4 mg Oral PRN Modena Slater, DO        multivitamin with minerals tablet 1 tablet  1 tablet Oral Daily Atway, Rayann N, DO   1 tablet at 12/17/23 1037   nicotine (NICODERM CQ - dosed in mg/24 hours) patch 21 mg  21 mg Transdermal Daily Tawkaliyar, Roya, DO   21 mg at 12/17/23 1037   ondansetron (ZOFRAN-ODT) disintegrating tablet 4 mg  4 mg Oral Q6H PRN Modena Slater, DO       pantoprazole (PROTONIX) EC tablet 40 mg  40 mg Oral Daily Atway, Rayann N, DO   40 mg at 12/17/23 1037   thiamine (VITAMIN B1) tablet 100 mg  100 mg Oral Daily Atway, Rayann N, DO   100 mg at 12/17/23 1037   Or   thiamine (VITAMIN B1) injection 100 mg  100 mg Intravenous Daily Atway, Rayann N, DO   100 mg at 12/13/23 2030     Discharge Medications: Please see discharge summary for a list of discharge medications.  Relevant Imaging Results:  Relevant Lab Results:   Additional Information SS#: [id number]  Baldemar Lenis, LCSW

## 2023-12-17 NOTE — Progress Notes (Addendum)
  Overnight events: None  Subjective: Reports doing well this morning. He is awake and eating breakfast. Reports that he is interested in quitting smoking. He wants to quit drinking alcohol, but does not want any medications for it. Reports he has a legal guardian name Carlisle Cater (her older brother's ex). She helps with paying bills, doctor's appointments.   Objective:   Vital signs in last 24 hours: Vitals:   12/17/23 0750 12/17/23 1049 12/17/23 1143 12/17/23 1226  BP: 113/80 (!) (P) 84/54 114/69   Pulse: 80 (P) 61 86   Resp: 18     Temp: 97.9 F (36.6 C)  98.2 F (36.8 C)   TempSrc: Oral  Oral   SpO2: 98%  98% 99%   Physical Exam: Constitutional: Comfortable, laying in bed in no acute distress Cardio: Regular rate and rhythm, no murmurs Pulm: Normal respiratory rate and effort Skin: No lesions or skin changes Neuro: Alert and oriented, speaking clearly; this appears to be his baseline; no focal deficits  Labs: No new labs  Assessment/Plan: Principal Problem:   Altered mental status Active Problems:   Overdose of undetermined intent   Acute encephalopathy   Alcohol use disorder Altered mental status Continues to do well on Librium taper. No new concerns today, medically stable for discharge. Pending SNF insurance authorization. Says he is motivated to stop using alcohol, but does not want naltrexone at this time. - Librium taper - Thiamine, multivitamins, folate - Pending SNF  Elevated Liver Enzymes Most likely secondary to alcohol use, possible metabolic contributions. Stable.   Hypertension Blood pressure well-controlled. Continue home amlodipine, enalapril.   Type 2 Diabetes Mellitus A1c 6.6. Holding metformin.   Tobacco use disorder Smoke 1 ppd. Would like to stop, requesting nicotine patches, ordered.   Diet: Normal IVF: None VTE: Enoxaparin Code: Full TOC recs: pending   Dispo: Anticipated discharge to  Home vs SNF  pending insurance  authorization.   Annett Fabian, MD 12/17/2023, 12:36 PM Pager: [phone] After 5pm on weekdays and 1pm on weekends: On Call pager [phone]

## 2023-12-17 NOTE — Hospital Course (Signed)
 Altered mental status Alcohol use disorder He presented to the emergency department    Hypomagnesemia, resolved   Elevated Liver Enzymes   Hypertension   Type 2 Diabetes Mellitus

## 2023-12-17 NOTE — Plan of Care (Signed)

## 2023-12-17 NOTE — Plan of Care (Signed)
 Requires much encouragement and reorienting, as has confusion.  Denies complaints.  Said he cannot go to his sister's house because she cannot take care of him.     Problem: Clinical Measurements: Goal: Will remain free from infection Outcome: Progressing   Problem: Clinical Measurements: Goal: Diagnostic test results will improve Outcome: Progressing   Problem: Clinical Measurements: Goal: Respiratory complications will improve Outcome: Progressing   Problem: Clinical Measurements: Goal: Cardiovascular complication will be avoided Outcome: Progressing   Problem: Activity: Goal: Risk for activity intolerance will decrease Outcome: Progressing   Problem: Nutrition: Goal: Adequate nutrition will be maintained Outcome: Progressing   Problem: Coping: Goal: Level of anxiety will decrease Outcome: Progressing   Problem: Pain Managment: Goal: General experience of comfort will improve and/or be controlled Outcome: Progressing   Problem: Safety: Goal: Ability to remain free from injury will improve Outcome: Progressing

## 2023-12-17 NOTE — Progress Notes (Signed)
 Physical Therapy Treatment Patient Details Name: Ruben Pierce MRN: [id number] DOB: [birth year] Today's Date: 12/17/2023   History of Present Illness Pt is 58 yo presenting to [facility] ED via EMS after being found obtuned at home on 12/13/23. PMH: DM II, HTN, HLD, COPD, alcohol use disorder, drug abuse    PT Comments  Patient resting in recliner and agreeable to mobilize with therapy. Pt demonstrated improved stability with rise for sit<>stand, CGA for safety. Pt initially reaching for support and HHA provided for stability then pt transitioned to RW. Min assist and cues to maintain safe position to walker at start and pt fading to CGA for safety with cues and assist only to complete turns. VSS and pt returned to recliner for seated LE exercises at EOS. Pt agreeable to remain OOB after exercises, Alarm on and call bell within reach. Will continue to progress pt as able.     If plan is discharge home, recommend the following: A little help with walking and/or transfers;Help with stairs or ramp for entrance;Assist for transportation   Can travel by private vehicle     Yes  Equipment Recommendations  None recommended by PT    Recommendations for Other Services       Precautions / Restrictions Precautions Precautions: Fall Recall of Precautions/Restrictions: Impaired Restrictions Weight Bearing Restrictions Per Provider Order: No     Mobility  Bed Mobility               General bed mobility comments: pt OOB in recliner    Transfers Overall transfer level: Needs assistance Equipment used: None, Rolling walker (2 wheels) Transfers: Sit to/from Stand Sit to Stand: Contact guard assist           General transfer comment: pt using bil UE to power up from recliner and EOB, reaching out to bed rail for support on stand and transitioned to RW. pt performed safe hand placement on RW without cues.    Ambulation/Gait Ambulation/Gait assistance: Min assist, Contact guard assist Gait  Distance (Feet): 150 Feet Assistive device: Rolling walker (2 wheels), 1 person hand held assist Gait Pattern/deviations: Step-through pattern, Decreased stride length, Narrow base of support, Drifts right/left Gait velocity: decr     General Gait Details: HHA initially for gait in room, min assist to steady and prevent LOB. transitioned to RW with cues to maintain safe position and min assist initially fading to CGA for safety for forward ambulation. min assist to stabilize with turn as pt tending to lift one side of walker with turn.   Stairs             Wheelchair Mobility     Tilt Bed    Modified Rankin (Stroke Patients Only)       Balance Overall balance assessment: Needs assistance Sitting-balance support: No upper extremity supported, Feet supported Sitting balance-Leahy Scale: Good     Standing balance support: Single extremity supported, During functional activity, Bilateral upper extremity supported Standing balance-Leahy Scale: Fair                              Hotel manager: No apparent difficulties  Cognition Arousal: Alert Behavior During Therapy: WFL for tasks assessed/performed   PT - Cognitive impairments: Orientation   Orientation impairments: Situation                     Following commands: Impaired Following commands impaired: Only follows one step commands consistently,  Follows multi-step commands inconsistently, Follows multi-step commands with increased time    Cueing Cueing Techniques: Verbal cues, Gestural cues  Exercises General Exercises - Lower Extremity Long Arc Quad: AROM, Both, 10 reps, Seated Hip Flexion/Marching: AROM, Both, 10 reps, Seated    General Comments        Pertinent Vitals/Pain Pain Assessment Pain Assessment: No/denies pain    Home Living                          Prior Function            PT Goals (current goals can now be found in the care  plan section) Acute Rehab PT Goals Patient Stated Goal: Return to PLOF PT Goal Formulation: With patient Time For Goal Achievement: 12/29/23 Potential to Achieve Goals: Good Progress towards PT goals: Progressing toward goals    Frequency    Min 3X/week      PT Plan      Co-evaluation              AM-PAC PT "6 Clicks" Mobility   Outcome Measure  Help needed turning from your back to your side while in a flat bed without using bedrails?: A Little Help needed moving from lying on your back to sitting on the side of a flat bed without using bedrails?: A Little Help needed moving to and from a bed to a chair (including a wheelchair)?: A Little Help needed standing up from a chair using your arms (e.g., wheelchair or bedside chair)?: A Little Help needed to walk in hospital room?: A Little Help needed climbing 3-5 steps with a railing? : A Lot 6 Click Score: 17    End of Session Equipment Utilized During Treatment: Gait belt Activity Tolerance: Patient tolerated treatment well Patient left: in chair;with call bell/phone within reach;with chair alarm set Nurse Communication: Mobility status PT Visit Diagnosis: Unsteadiness on feet (R26.81);History of falling (Z91.81);Muscle weakness (generalized) (M62.81)     Time: 0454-0981 PT Time Calculation (min) (ACUTE ONLY): 16 min  Charges:    $Gait Training: 8-22 mins PT General Charges $$ ACUTE PT VISIT: 1 Visit                     Wynn Maudlin, DPT Acute Rehabilitation Services Office [phone]  12/17/23 4:20 PM

## 2023-12-18 DIAGNOSIS — R4182 Altered mental status, unspecified: Secondary | ICD-10-CM | POA: Diagnosis not present

## 2023-12-18 DIAGNOSIS — F1090 Alcohol use, unspecified, uncomplicated: Secondary | ICD-10-CM | POA: Diagnosis not present

## 2023-12-18 DIAGNOSIS — F17210 Nicotine dependence, cigarettes, uncomplicated: Secondary | ICD-10-CM | POA: Diagnosis not present

## 2023-12-18 LAB — COMPREHENSIVE METABOLIC PANEL
ALT: 77 U/L — ABNORMAL HIGH (ref 0–44)
AST: 37 U/L (ref 15–41)
Albumin: 3.4 g/dL — ABNORMAL LOW (ref 3.5–5.0)
Alkaline Phosphatase: 82 U/L (ref 38–126)
Anion gap: 13 (ref 5–15)
BUN: 19 mg/dL (ref 6–20)
CO2: 23 mmol/L (ref 22–32)
Calcium: 9.2 mg/dL (ref 8.9–10.3)
Chloride: 95 mmol/L — ABNORMAL LOW (ref 98–111)
Creatinine, Ser: 0.97 mg/dL (ref 0.61–1.24)
GFR, Estimated: >60 mL/min (ref >60)
Glucose, Bld: 270 mg/dL — ABNORMAL HIGH (ref 70–99)
Potassium: 3.9 mmol/L (ref 3.5–5.1)
Sodium: 131 mmol/L — ABNORMAL LOW (ref 135–145)
Total Bilirubin: 0.3 mg/dL (ref 0.0–1.2)
Total Protein: 6.3 g/dL — ABNORMAL LOW (ref 6.5–8.1)

## 2023-12-18 MED ORDER — NALTREXONE HCL 50 MG PO TABS
25.0000 mg | ORAL_TABLET | Freq: Every day | ORAL | Status: DC
  Filled 2023-12-18: qty 1

## 2023-12-18 MED ORDER — NALTREXONE HCL 50 MG PO TABS
50.0000 mg | ORAL_TABLET | Freq: Every day | ORAL | Status: DC
  Administered 2023-12-18 – 2023-12-19 (×2): 50 mg via ORAL
  Filled 2023-12-18 (×2): qty 1

## 2023-12-18 NOTE — Plan of Care (Signed)
 Got self dressed and packed and attempted to leave the floor.  York Spaniel he was going home.  Had to be reminded that he has to get better and be able to take care of himself at home and that he is unsafe to go home independently at this time, and sister said she cannot take care of him at home at this time.  At first he refused to go back to his room and he also was asking anyone with a vehicle if they could drop him off at home.  He finally agreed to go back to room and get back into hospital gown.  He had turned off the chair alarm system as well so it had to be placed on the wall behind his chair so he could not get it and turn it off.  No signs of distress.  Did walk him in the hallway to help him to stretch his legs and walk to get out of the room some.   Problem: Activity: Goal: Risk for activity intolerance will decrease Outcome: Progressing   Problem: Nutrition: Goal: Adequate nutrition will be maintained Outcome: Progressing   Problem: Coping: Goal: Level of anxiety will decrease Outcome: Progressing   Problem: Safety: Goal: Ability to remain free from injury will improve Outcome: Progressing   Problem: Skin Integrity: Goal: Risk for impaired skin integrity will decrease Outcome: Progressing

## 2023-12-18 NOTE — Inpatient Diabetes Management (Signed)
 Inpatient Diabetes Program Recommendations  AACE/ADA: New Consensus Statement on Inpatient Glycemic Control (2015)  Target Ranges:  Prepandial:   less than 140 mg/dL      Peak postprandial:   less than 180 mg/dL (1-2 hours)      Critically ill patients:  140 - 180 mg/dL    Latest Reference Range & Units 12/14/23 06:54  Hemoglobin A1C 4.8 - 5.6 % 6.6 (H)  (H): Data is abnormally high   Admit with:  58 yo male with T2DM, HTN, HLD, COPD, alcohol use disorder, drug abuse who was brought in by EMS after being found down at his home  Per EMS report, patient was found obtunded at home with GCS 8 and empty pill bottles nearby.  Alcohol use disorder Altered mental status  History: DM  Home DM Meds: Metformin 1000 mg BID       Invokana 300 mg daily  Current Orders: Metformin 1000 mg BID    MD- No CBG checks since admission  Please add CBG checks TID AC + HS  May also consider adding Novolog SSI 0-6 units TID AC + HS (very sensitive scale)    --Will follow patient during hospitalization--  Ambrose Finland RN, MSN, CDCES Diabetes Coordinator Inpatient Glycemic Control Team Team Pager: [phone] (8a-5p)

## 2023-12-18 NOTE — Plan of Care (Signed)

## 2023-12-18 NOTE — TOC Progression Note (Signed)
 Transition of Care [facility]) - Progression Note    Patient Details  Name: Ruben Pierce MRN: [id number] Date of Birth: 23-Jan-1966  [facility]) CM/SW Contact  Baldemar Lenis, [place] Phone Number: 12/18/2023, 1:36 PM  Clinical Narrative:   CSW continuing to work on SNF placement. [facility] is considering; CSW spoke with Admissions. Admissions reviewing finances, but also concerned about reports about patient's substance abuse. Admissions will need to meet with patient in person for assessment before determining whether they can make a bed offer, and will come do assessment tomorrow morning. CSW updated MD on barriers to discharge. CSW to follow.    Expected Discharge Plan: Skilled Nursing Facility Barriers to Discharge: Continued Medical Work up, English as a second language teacher, Active Substance Use - Placement  Expected Discharge Plan and Services     Post Acute Care Choice: Skilled Nursing Facility Living arrangements for the past 2 months: Single Family Home                                       Social Determinants of Health (SDOH) Interventions SDOH Screenings   Food Insecurity: Patient Unable To Answer (12/13/2023)  Housing: Patient Unable To Answer (12/13/2023)  Transportation Needs: Patient Unable To Answer (12/13/2023)  Utilities: Patient Unable To Answer (12/13/2023)  Alcohol Screen: Medium Risk (03/21/2018)  Depression (PHQ2-9): Low Risk  (11/30/2022)  Social Connections: Patient Unable To Answer (12/13/2023)  Tobacco Use: High Risk (12/13/2023)    Readmission Risk Interventions     No data to display

## 2023-12-18 NOTE — Progress Notes (Signed)
 Occupational Therapy Treatment Patient Details Name: Ruben Pierce MRN: [id number] DOB: [birth year] Today's Date: 12/18/2023   History of present illness Pt is 58 yo presenting to [facility] ED via EMS after being found obtuned at home on 12/13/23. PMH: DM II, HTN, HLD, COPD, alcohol use disorder, drug abuse   OT comments  Pt progressing toward goals this session, completing ADLs with up to min A, CGA for transfers with RW. Pt able to complete standing grooming tasks at sink prior to hallway ambulation. Pt with decreased cognition/short term memory, with slow processing and recalls 1/3 words after ~3 mins. Pt presenting with impairments listed below, will follow acutely. Patient will benefit from continued inpatient follow up therapy, <3 hours/day to maximize safety/ind with ADL/functional mobility.       If plan is discharge home, recommend the following:  A little help with bathing/dressing/bathroom;Assistance with cooking/housework;Supervision due to cognitive status;Direct supervision/assist for medications management;Assist for transportation;Direct supervision/assist for financial management;A little help with walking and/or transfers   Equipment Recommendations  Other (comment)    Recommendations for Other Services PT consult    Precautions / Restrictions Precautions Precautions: Fall Recall of Precautions/Restrictions: Impaired Restrictions Weight Bearing Restrictions Per Provider Order: No       Mobility Bed Mobility               General bed mobility comments: pt OOB in recliner    Transfers Overall transfer level: Needs assistance Equipment used: Rolling walker (2 wheels) Transfers: Sit to/from Stand Sit to Stand: Contact guard assist                 Balance Overall balance assessment: Needs assistance Sitting-balance support: No upper extremity supported, Feet supported Sitting balance-Leahy Scale: Good     Standing balance support: During functional  activity Standing balance-Leahy Scale: Fair Standing balance comment: static standing at sink for grooming task                           ADL either performed or assessed with clinical judgement   ADL       Grooming: Wash/dry face;Brushing hair;Standing           Upper Body Dressing : Minimal assistance Upper Body Dressing Details (indicate cue type and reason): orienting and donning gown Lower Body Dressing: Minimal assistance Lower Body Dressing Details (indicate cue type and reason): reaches down to pull up socks             Functional mobility during ADLs: Contact guard assist;Rolling walker (2 wheels)      Extremity/Trunk Assessment Upper Extremity Assessment Upper Extremity Assessment: Overall WFL for tasks assessed   Lower Extremity Assessment Lower Extremity Assessment: Defer to PT evaluation        Vision       Perception Perception Perception: Not tested   Praxis Praxis Praxis: Not tested   Communication Communication Communication: No apparent difficulties   Cognition Arousal: Alert Behavior During Therapy: WFL for tasks assessed/performed Cognition: Cognition impaired   Orientation impairments: Situation, Time Awareness: Intellectual awareness impaired, Online awareness impaired Memory impairment (select all impairments): Working [employer] fast streamer, Conservation officer, historic buildings, Short-term memory Attention impairment (select first level of impairment): Selective attention Executive functioning impairment (select all impairments): Reasoning, Problem solving, Sequencing OT - Cognition Comments: pt able to recall 1/3 words when given 3 word recall, able to count backward 20-1, unable to state months of year backwards, but able to state forwards, can perform simple math task,  does not recall date.                 Following commands: Intact        Cueing   Cueing Techniques: Verbal cues, Gestural cues  Exercises      Shoulder  Instructions       General Comments VSS    Pertinent Vitals/ Pain       Pain Assessment Pain Assessment: No/denies pain  Home Living   Living Arrangements: Other relatives (brother and sister)                                      Prior Functioning/Environment              Frequency  Min 2X/week        Progress Toward Goals  OT Goals(current goals can now be found in the care plan section)  Progress towards OT goals: Progressing toward goals  Acute Rehab OT Goals Patient Stated Goal: none stated OT Goal Formulation: With patient Time For Goal Achievement: 12/29/23 Potential to Achieve Goals: Good ADL Goals Pt Will Perform Grooming: with supervision;standing Pt Will Perform Lower Body Bathing: with set-up;sit to/from stand Pt Will Perform Lower Body Dressing: with supervision;sit to/from stand Pt Will Transfer to Toilet: with supervision;ambulating Pt Will Perform Tub/Shower Transfer: Tub transfer;with supervision Additional ADL Goal #3: Pt will complete 3 step iADL task with setup assist  Plan      Co-evaluation                 AM-PAC OT "6 Clicks" Daily Activity     Outcome Measure   Help from another person eating meals?: A Little Help from another person taking care of personal grooming?: A Little Help from another person toileting, which includes using toliet, bedpan, or urinal?: A Little Help from another person bathing (including washing, rinsing, drying)?: A Little Help from another person to put on and taking off regular upper body clothing?: A Little Help from another person to put on and taking off regular lower body clothing?: A Little 6 Click Score: 18    End of Session Equipment Utilized During Treatment: Gait belt  OT Visit Diagnosis: Unsteadiness on feet (R26.81);Other abnormalities of gait and mobility (R26.89);Other symptoms and signs involving cognitive function   Activity Tolerance Patient tolerated treatment  well   Patient Left in chair;with call bell/phone within reach;with chair alarm set;with nursing/sitter in room   Nurse Communication Mobility status        Time: 1100-1115 OT Time Calculation (min): 15 min  Charges: OT General Charges $OT Visit: 1 Visit OT Treatments $Therapeutic Activity: 8-22 mins  Ruben Pierce, OTD, OTR/L SecureChat Preferred Acute Rehab [phone]   Francesca Strome K Koonce 12/18/2023, 11:22 AM

## 2023-12-18 NOTE — Progress Notes (Signed)
  Overnight events: None  Subjective: Doing well this morning, no concerns.  Eating breakfast.  Motivated to participate in rehab and quit smoking and drinking.    Objective:   Vital signs in last 24 hours: Vitals:   12/18/23 0028 12/18/23 0354 12/18/23 0828 12/18/23 0829  BP: 132/82 107/61 128/82   Pulse: 91 87 85   Resp: 18 18    Temp: 97.7 F (36.5 C) 98 F (36.7 C) 97.9 F (36.6 C)   TempSrc: Oral  Axillary   SpO2: 98% 100% 100% 98%   Physical Exam: Constitutional: Comfortable, sitting up in the chair Cardio: Regular rate and rhythm, no murmurs Pulm: Mild wheezing, improved from yesterday; normal respiratory rate and effort Skin: No lesions or skin changes Neuro: Alert and oriented, no focal deficits  Labs: Mild hyponatremia Improvement in liver enzymes Fasting glucose 270  Assessment/Plan: Principal Problem:   Alcohol use disorder Active Problems:   Current smoker  Alcohol use disorder Altered mental status Doing well this morning, last day of Librium taper.  No acute concerns.  Remains medically stable for discharge pending SNF placement.  Motivated to stop drinking and smoking. - Thiamine, multivitamins, folate - Naltrexone 50 mg daily - Pending SNF  Elevated Liver Enzymes Liver enzymes improving.  Most likely secondary to alcohol use, possible metabolic contributions.  Hypertension Blood pressure well-controlled. Continue home amlodipine, enalapril.   Type 2 Diabetes Mellitus A1c 6.6.  Restarted home metformin 2000 mg daily.  Tobacco use disorder Smokes 1 ppd for 30 years or so.  Has nicotine patches and will discharge with him with these.  Diet: Normal IVF: None VTE: Enoxaparin Code: Full TOC recs: pending   Dispo: Anticipated discharge to  Home vs SNF  pending insurance authorization.   Annett Fabian, MD 12/18/2023, 11:09 AM Pager: [phone] After 5pm on weekdays and 1pm on weekends: On Call pager [phone]

## 2023-12-19 ENCOUNTER — Other Ambulatory Visit (HOSPITAL_COMMUNITY): Payer: Self-pay

## 2023-12-19 DIAGNOSIS — F17210 Nicotine dependence, cigarettes, uncomplicated: Secondary | ICD-10-CM | POA: Diagnosis not present

## 2023-12-19 DIAGNOSIS — F1090 Alcohol use, unspecified, uncomplicated: Secondary | ICD-10-CM | POA: Diagnosis not present

## 2023-12-19 DIAGNOSIS — R4182 Altered mental status, unspecified: Secondary | ICD-10-CM | POA: Diagnosis not present

## 2023-12-19 MED ORDER — VARENICLINE TARTRATE 1 MG PO TABS
1.0000 mg | ORAL_TABLET | Freq: Two times a day (BID) | ORAL | 1 refills | Status: DC
  Filled 2023-12-19 (×2): qty 60, 30d supply, fill #0

## 2023-12-19 MED ORDER — THIAMINE HCL 100 MG PO TABS
100.0000 mg | ORAL_TABLET | Freq: Every day | ORAL | 3 refills | Status: DC
  Filled 2023-12-19 (×2): qty 30, 30d supply, fill #0

## 2023-12-19 MED ORDER — VARENICLINE TARTRATE 1 MG PO TABS: 1.0000 mg | ORAL_TABLET | Freq: Two times a day (BID) | ORAL | 1 refills | Status: DC

## 2023-12-19 MED ORDER — VARENICLINE TARTRATE (STARTER) 0.5 MG X 11 & 1 MG X 42 PO TBPK
ORAL_TABLET | ORAL | 0 refills | Status: DC
  Filled 2023-12-19 (×2): qty 53, 28d supply, fill #0

## 2023-12-19 MED ORDER — ORAL CARE MOUTH RINSE: 15.0000 mL | OROMUCOSAL | Status: DC | PRN

## 2023-12-19 MED ORDER — FOLIC ACID 1 MG PO TABS
1.0000 mg | ORAL_TABLET | Freq: Every day | ORAL | 0 refills | Status: AC
  Filled 2023-12-19 (×2): qty 90, 90d supply, fill #0

## 2023-12-19 MED ORDER — VARENICLINE TARTRATE (STARTER) 0.5 MG X 11 & 1 MG X 42 PO TBPK: ORAL_TABLET | ORAL | 0 refills | Status: AC

## 2023-12-19 MED ORDER — NALTREXONE HCL 50 MG PO TABS
50.0000 mg | ORAL_TABLET | Freq: Every day | ORAL | 3 refills | Status: DC
Start: 2023-12-19 — End: 2024-02-05
  Filled 2023-12-19 (×2): qty 90, 90d supply, fill #0

## 2023-12-19 MED ORDER — ARFORMOTEROL TARTRATE 15 MCG/2ML IN NEBU
15.0000 ug | INHALATION_SOLUTION | Freq: Two times a day (BID) | RESPIRATORY_TRACT | 2 refills | Status: DC
  Filled 2023-12-19: qty 120, 30d supply, fill #0

## 2023-12-19 MED ORDER — UMECLIDINIUM BROMIDE 62.5 MCG/ACT IN AEPB
1.0000 | INHALATION_SPRAY | Freq: Every day | RESPIRATORY_TRACT | 2 refills | Status: DC
Start: 2023-12-20 — End: 2023-12-19
  Filled 2023-12-19: qty 30, 30d supply, fill #0

## 2023-12-19 MED ORDER — ADULT MULTIVITAMIN W/MINERALS CH
1.0000 | ORAL_TABLET | Freq: Every day | ORAL | 3 refills | Status: DC
  Filled 2023-12-19 (×2): qty 30, 30d supply, fill #0

## 2023-12-19 NOTE — Progress Notes (Addendum)
 Occupational Therapy Treatment Patient Details Name: Ruben Pierce MRN: [id number] DOB: 11-Mar-1966 Today's Date: 12/19/2023   History of present illness Pt is 58 yo presenting to [facility] ED via EMS after being found obtuned at home on 12/13/23. PMH: DM II, HTN, HLD, COPD, alcohol use disorder, drug abuse   OT comments  Pt progressing toward goals this session, completes pillbox assessment with 4 errors and mod cues, pt completes pillbox assessment in ~10 mins. Pt with difficulty reading words for instructions on pill bottles. Discussed having assist with med mgmt at home, pt states his sister and brother could assist. Pt presenting with impairments listed below, will follow acutely. Updating d/c recommendation to OP OT at d/c.      If plan is discharge home, recommend the following:  A little help with bathing/dressing/bathroom;Assistance with cooking/housework;Supervision due to cognitive status;Direct supervision/assist for medications management;Assist for transportation;Direct supervision/assist for financial management;A little help with walking and/or transfers   Equipment Recommendations  None recommended by OT    Recommendations for Other Services PT consult    Precautions / Restrictions Precautions Precautions: Fall Recall of Precautions/Restrictions: Intact Restrictions Weight Bearing Restrictions Per Provider Order: No       Mobility Bed Mobility               General bed mobility comments: pt OOB in recliner    Transfers Overall transfer level: Modified independent Equipment used: None Transfers: Sit to/from Stand                   Balance Overall balance assessment: Mild deficits observed, not formally tested Sitting-balance support: No upper extremity supported, Feet supported Sitting balance-Leahy Scale: Normal     Standing balance support: During functional activity, No upper extremity supported Standing balance-Leahy Scale: Fair Standing balance  comment: no loss of balance during dynamic functional activities wihtout an AD.                           ADL either performed or assessed with clinical judgement   ADL Overall ADL's : Needs assistance/impaired                                       General ADL Comments: session focused on administration of pillbox assessment    Extremity/Trunk Assessment Upper Extremity Assessment Upper Extremity Assessment: Generalized weakness (some decr [facility])   Lower Extremity Assessment Lower Extremity Assessment: Defer to PT evaluation        Vision   Vision Assessment?: Wears glasses for reading   Perception Perception Perception: Not tested   Praxis Praxis Praxis: Not tested   Communication Communication Communication: No apparent difficulties   Cognition Arousal: Alert   Cognition: Cognition impaired   Orientation impairments: Situation   Memory impairment (select all impairments): Short-term memory, Working memory Attention impairment (select first level of impairment): Alternating attention Executive functioning impairment (select all impairments): Reasoning, Problem solving, Sequencing OT - Cognition Comments: needing mod cues to perform pillbox assessment                 Following commands: Intact Following commands impaired: Follows multi-step commands with increased time      Cueing   Cueing Techniques: Verbal cues, Gestural cues  Exercises      Shoulder Instructions       General Comments VSS    Pertinent Vitals/ Pain  Pain Assessment Pain Assessment: No/denies pain  Home Living                                          Prior Functioning/Environment              Frequency  Min 2X/week        Progress Toward Goals  OT Goals(current goals can now be found in the care plan section)  Progress towards OT goals: Progressing toward goals  Acute Rehab OT Goals Patient Stated Goal: none  stated OT Goal Formulation: With patient Time For Goal Achievement: 12/29/23 Potential to Achieve Goals: Good ADL Goals Pt Will Perform Grooming: with supervision;standing Pt Will Perform Lower Body Bathing: with set-up;sit to/from stand Pt Will Perform Lower Body Dressing: with supervision;sit to/from stand Pt Will Transfer to Toilet: with supervision;ambulating Pt Will Perform Tub/Shower Transfer: Tub transfer;with supervision Additional ADL Goal #3: Pt will complete 3 step iADL task with setup assist  Plan      Co-evaluation                 AM-PAC OT "6 Clicks" Daily Activity     Outcome Measure   Help from another person eating meals?: A Little Help from another person taking care of personal grooming?: A Little Help from another person toileting, which includes using toliet, bedpan, or urinal?: A Little Help from another person bathing (including washing, rinsing, drying)?: A Little Help from another person to put on and taking off regular upper body clothing?: A Little Help from another person to put on and taking off regular lower body clothing?: A Little 6 Click Score: 18    End of Session Equipment Utilized During Treatment: Gait belt  OT Visit Diagnosis: Unsteadiness on feet (R26.81);Other abnormalities of gait and mobility (R26.89);Other symptoms and signs involving cognitive function   Activity Tolerance Patient tolerated treatment well   Patient Left in chair;with call bell/phone within reach;with chair alarm set;with nursing/sitter in room   Nurse Communication Mobility status        Time: 5409-8119 OT Time Calculation (min): 18 min  Charges: OT General Charges $OT Visit: 1 Visit OT Treatments $Therapeutic Activity: 8-22 mins  Ruben Pierce, OTD, OTR/L SecureChat Preferred Acute Rehab [phone]   Ruben Pierce 12/19/2023, 2:22 PM

## 2023-12-19 NOTE — Progress Notes (Signed)
  Overnight events: None  Subjective: Doing well this morning, no concerns. SNF came to evaluate him this morning. Remains eager for rehab, alcohol and tobacco cessation.   Objective:   Vital signs in last 24 hours: Vitals:   12/19/23 0357 12/19/23 0748 12/19/23 0842 12/19/23 1137  BP: 121/81 116/79  117/77  Pulse: 90 100  93  Resp:  17  17  Temp: 97.7 F (36.5 C) 98 F (36.7 C)  98.2 F (36.8 C)  TempSrc: Oral Oral  Oral  SpO2: 98% 97% 97% 99%  Height:       Physical Exam: Constitutional: Comfortable, sitting in chair in NAD Cardio: Regular rate and rhythm, no murmurs Pulm: Normal respiratory rate and effort Skin: No lesions or skin changes Neuro: Alert and oriented x 3, no focal deficits  Labs: No new labs  Assessment/Plan: Principal Problem:   Alcohol use disorder Active Problems:   Current smoker  Alcohol use disorder Medically stable for discharge. No new concerns, remains motivated to stop drinking. SNF evaluated him in person this morning, awaiting decision about acceptance.  - Thiamine, multivitamins, folate - Pending SNF - Naltrexone 50 mg daily  Hypertension Blood pressure well-controlled. Continue home amlodipine, enalapril.   Type 2 Diabetes Mellitus A1c 6.6. Continue metformin.   Tobacco use disorder Smokes 1 pack per day, motivated to quit. Will discharge with chantix, nicotine gum.    Diet: Normal IVF: None VTE: Enoxaparin Code: Full TOC recs: pending   Dispo: Anticipated discharge to  Home vs SNF  pending insurance authorization.   Annett Fabian, MD 12/19/2023, 1:01 PM Pager: [phone] After 5pm on weekdays and 1pm on weekends: On Call pager [phone]

## 2023-12-19 NOTE — TOC Transition Note (Signed)
 Transition of Care [facility]) - Discharge Note   Patient Details  Name: Ruben Pierce MRN: [id number] Date of Birth: 1966-03-10  [facility]) CM/SW Contact:  Baldemar Lenis, LCSW Phone Number: 12/19/2023, 3:40 PM   Clinical Narrative:   CSW spoke with Admissions with [facility], they were able to assess patient this morning but still need to do asset review to verify Medicaid, asking for information about patient's payee. CSW contacted sister, she would have to call CSW  back with information. CSW spoke with MD to discuss barriers to discharge.  CSW later coordinated with PT and OT assigned, patient cleared to go home, only needs assist with medications. CSW spoke with sister, Junious Dresser, she already assists patient with medications. CSW discussed outpatient OT, and sister in agreement but doesn't have transportation. CSW provided sister with number for Medicaid transportation, placed on AVS. Referral sent to [facility], information placed on AVS, sister aware she needs to call and schedule. Patient will need transportation home, address verified and cab voucher provided to RN. No further TOC needs.    Final next level of care: OP Rehab Barriers to Discharge: Barriers Resolved   Patient Goals and CMS Choice Patient states their goals for this hospitalization and ongoing recovery are:: patient unable to participate in goal setting, not oriented CMS Medicare.gov Compare Post Acute Care list provided to:: Patient Represenative (must comment) Choice offered to / list presented to : Sibling [place] ownership interest in [facility].provided to:: Sibling    Discharge Placement                Patient to be transferred to facility by: Cab Name of family member notified: Junious Dresser Patient and family notified of of transfer: 12/19/23  Discharge Plan and Services Additional resources added to the After Visit Summary for       Post Acute Care Choice: Skilled Nursing  Facility                               Social Drivers of Health (SDOH) Interventions SDOH Screenings   Food Insecurity: Patient Unable To Answer (12/13/2023)  Housing: Patient Unable To Answer (12/13/2023)  Transportation Needs: Patient Unable To Answer (12/13/2023)  Utilities: Patient Unable To Answer (12/13/2023)  Alcohol Screen: Medium Risk (03/21/2018)  Depression (PHQ2-9): Low Risk  (11/30/2022)  Social Connections: Patient Unable To Answer (12/13/2023)  Tobacco Use: High Risk (12/13/2023)     Readmission Risk Interventions     No data to display

## 2023-12-19 NOTE — Plan of Care (Signed)
 Discharged to home after instructions given at bedside.  VeRN nurse attempted to call sister to review discharge instructions with but she did not answer.  I did confirm with pt that he would be able to get in the house when he gets there by cab if his sister is not at home.       Problem: Education: Goal: Knowledge of General Education information will improve Description: Including pain rating scale, medication(s)/side effects and non-pharmacologic comfort measures Outcome: Adequate for Discharge   Problem: Health Behavior/Discharge Planning: Goal: Ability to manage health-related needs will improve Outcome: Adequate for Discharge   Problem: Clinical Measurements: Goal: Ability to maintain clinical measurements within normal limits will improve Outcome: Adequate for Discharge Goal: Will remain free from infection Outcome: Adequate for Discharge Goal: Diagnostic test results will improve Outcome: Adequate for Discharge Goal: Respiratory complications will improve Outcome: Adequate for Discharge Goal: Cardiovascular complication will be avoided Outcome: Adequate for Discharge   Problem: Activity: Goal: Risk for activity intolerance will decrease Outcome: Adequate for Discharge   Problem: Nutrition: Goal: Adequate nutrition will be maintained Outcome: Adequate for Discharge   Problem: Coping: Goal: Level of anxiety will decrease Outcome: Adequate for Discharge   Problem: Elimination: Goal: Will not experience complications related to bowel motility Outcome: Adequate for Discharge Goal: Will not experience complications related to urinary retention Outcome: Adequate for Discharge   Problem: Pain Managment: Goal: General experience of comfort will improve and/or be controlled Outcome: Adequate for Discharge   Problem: Safety: Goal: Ability to remain free from injury will improve Outcome: Adequate for Discharge   Problem: Skin Integrity: Goal: Risk for impaired skin  integrity will decrease Outcome: Adequate for Discharge

## 2023-12-19 NOTE — Progress Notes (Signed)
 I called this patient's sister, Junious Dresser, and spoke with her about his discharge plan.  She reported that she does not have any concerns about him returning home.  She reports that she also takes care of his older brother, Gianno Volner, and she has no concerns about taking care of him as well.  She reports that Travonne is otherwise pretty independent, he takes care of himself.  Junious Dresser also provided Curlene Labrum) [employer] number [phone] who takes care of finances.   PT/OT re-evaluated, no recommendation at this time. No SNF rec.   I evaluated the patient at bedside, he reports that he does not want to go to rehab.  He wants to go home, and see his family.  He states that he will not drink anymore, it seems that he associates rehab with alcohol cessation rehab.  He was notified at that the rehab is for him to go and build his strength and work with therapist.  He reports that he does not need to go to rehab for that, he wants to go home.  He reports that he can go home by either using a bus or a cab.  He was reassured that we can provide him a voucher for a To go home.  He is happy to hear that.   Social worker is informed. She spoke with his sister, Junious Dresser, who is expecting him to come home today.

## 2023-12-19 NOTE — Progress Notes (Signed)
 Went over discharge medication and instructions with patient. I tried to call his sister Junious Dresser, brother Ebbie Ridge and friend Kathie Rhodes (at pt's request) and no answer from either. Left message to give the unit a return call.

## 2023-12-19 NOTE — Progress Notes (Signed)
 Physical Therapy Treatment Patient Details Name: Ruben Pierce MRN: [id number] DOB: [birth year] Today's Date: 12/19/2023   History of Present Illness Pt is 58 yo presenting to [facility] ED via EMS after being found obtuned at home on 12/13/23. PMH: DM II, HTN, HLD, COPD, alcohol use disorder, drug abuse    PT Comments  Pt is progressing towards or has met all goals. Currently pt is Mod I for sit to stand and gait without an AD. Pt states that he is ambulating close to baseline; he uses a SPC at home. Due to pt current functional status, home set up and available assistance at home no recommended skilled physical therapy services at this time on discharge from acute care hospital setting. Will continue to follow in acute setting in order to ensure that pt returns home with decreased risk for falls, injury, re-hospitalization and improved activity tolerance.      If plan is discharge home, recommend the following: Assist for transportation   Can travel by private vehicle     Yes  Equipment Recommendations  None recommended by PT       Precautions / Restrictions Precautions Precautions: Fall Recall of Precautions/Restrictions: Intact Restrictions Weight Bearing Restrictions Per Provider Order: No     Mobility  Bed Mobility     General bed mobility comments: pt OOB in recliner    Transfers Overall transfer level: Modified independent Equipment used: None Transfers: Sit to/from Stand Sit to Stand: Modified independent (Device/Increase time)           General transfer comment: stabilizes himself on arm rests and bedside table. Wide BOS    Ambulation/Gait Ambulation/Gait assistance: Modified independent (Device/Increase time) Gait Distance (Feet): 400 Feet Assistive device: None Gait Pattern/deviations: Step-through pattern, Decreased stride length, Wide base of support Gait velocity: decr Gait velocity interpretation: 1.31 - 2.62 ft/sec, indicative of limited community  ambulator   General Gait Details: Pt demonstrates very mild LLE circumduction during gait with R lateral trunk during swing phase of gait on the LLE. Pt states he has always ambulated like this and uses a SPC at home.       Balance Overall balance assessment: Mild deficits observed, not formally tested Sitting-balance support: No upper extremity supported, Feet supported Sitting balance-Leahy Scale: Normal     Standing balance support: During functional activity, No upper extremity supported Standing balance-Leahy Scale: Fair Standing balance comment: no loss of balance during dynamic functional activities wihtout an AD.      Communication Communication Communication: No apparent difficulties  Cognition Arousal: Alert Behavior During Therapy: WFL for tasks assessed/performed       PT - Cognition Comments: A&O to self and location, but not time/date Following commands: Intact Following commands impaired: Follows multi-step commands with increased time    Cueing Cueing Techniques: Verbal cues, Gestural cues     General Comments General comments (skin integrity, edema, etc.): No signs/symptoms of cardiac/respirtory distress during session.      Pertinent Vitals/Pain Pain Assessment Pain Assessment: No/denies pain     PT Goals (current goals can now be found in the care plan section) Acute Rehab PT Goals Patient Stated Goal: Return to PLOF PT Goal Formulation: With patient Time For Goal Achievement: 12/29/23 Potential to Achieve Goals: Good Progress towards PT goals: Progressing toward goals    Frequency    Min 1X/week      PT Plan  Decrease frequency and changed discharge recommendations       AM-PAC PT "6 Clicks" Mobility   Outcome  Measure  Help needed turning from your back to your side while in a flat bed without using bedrails?: None Help needed moving from lying on your back to sitting on the side of a flat bed without using bedrails?: None Help  needed moving to and from a bed to a chair (including a wheelchair)?: None Help needed standing up from a chair using your arms (e.g., wheelchair or bedside chair)?: None Help needed to walk in hospital room?: None Help needed climbing 3-5 steps with a railing? : A Little 6 Click Score: 23    End of Session Equipment Utilized During Treatment: Gait belt Activity Tolerance: Patient tolerated treatment well Patient left: in chair;with call bell/phone within reach;with chair alarm set Nurse Communication: Mobility status PT Visit Diagnosis: Unsteadiness on feet (R26.81);History of falling (Z91.81);Muscle weakness (generalized) (M62.81)     Time: 0454-0981 PT Time Calculation (min) (ACUTE ONLY): 8 min  Charges:    $Therapeutic Activity: 8-22 mins PT General Charges $$ ACUTE PT VISIT: 1 Visit                     Harrel Carina, DPT, CLT  Acute Rehabilitation Services Office: [phone] (Secure chat preferred)    Ruben Pierce 12/19/2023, 1:59 PM

## 2023-12-19 NOTE — Discharge Summary (Addendum)
 Name: Ruben Pierce MRN: [id number] DOB: [birth year] 58 y.o. PCP: Rocky Morel, DO  Date of Admission: 12/13/2023  3:02 PM Date of Discharge:  12/19/2023 Attending Physician: Dr. Sol Blazing  DISCHARGE DIAGNOSIS:  Primary Problem: Alcohol use disorder   Hospital Problems: Principal Problem:   Alcohol use disorder Active Problems:   Current smoker   DISCHARGE MEDICATIONS:   Allergies as of 12/19/2023   No Known Allergies      Medication List     TAKE these medications    Accu-Chek Aviva Plus test strip Generic drug: glucose blood USE AS DIRECTED EVERY DAY   acetaminophen 500 MG tablet Commonly known as: TYLENOL Take 2 tablets (1,000 mg total) by mouth every 8 (eight) hours as needed.   amitriptyline 100 MG tablet Commonly known as: ELAVIL TAKE ONE TABLET BY MOUTH AT BEDTIME   amLODipine 10 MG tablet Commonly known as: NORVASC Take 1 tablet (10 mg total) by mouth daily.   atorvastatin 80 MG tablet Commonly known as: LIPITOR TAKE ONE TABLET BY MOUTH EVERY DAY   enalapril 10 MG tablet Commonly known as: VASOTEC Take 1 tablet (10 mg total) by mouth daily.   famotidine 20 MG tablet Commonly known as: PEPCID TAKE ONE TABLET BY MOUTH TWICE DAILY   folic acid 1 MG tablet Commonly known as: FOLVITE Take 1 tablet (1 mg total) by mouth daily. Start taking on: December 20, 2023   freestyle lancets Use as instructed   Invokana 300 MG Tabs tablet Generic drug: canagliflozin TAKE ONE TABLET BY MOUTH EVERY DAY ** BEFORE breakfast **   metFORMIN 1000 MG tablet Commonly known as: GLUCOPHAGE Take 1 tablet (1,000 mg total) by mouth 2 (two) times daily with a meal.   multivitamin with minerals Tabs tablet Take 1 tablet by mouth daily. Start taking on: December 20, 2023   mupirocin ointment 2 % Commonly known as: BACTROBAN APPLY TO THE AFFECTED AREA(S) EVERY DAY   naltrexone 50 MG tablet Commonly known as: DEPADE Take 1 tablet (50 mg total) by mouth daily.    pantoprazole 40 MG tablet Commonly known as: PROTONIX Take 1 tablet (40 mg total) by mouth daily.   Stiolto Respimat 2.5-2.5 MCG/ACT Aers Generic drug: Tiotropium Bromide-Olodaterol INHALE TWO puffs into THE lungs EVERY DAY   thiamine 100 MG tablet Commonly known as: Vitamin B-1 Take 1 tablet (100 mg total) by mouth daily. Start taking on: December 20, 2023   Varenicline Tartrate (Starter) 0.5 MG X 11 & 1 MG X 42 Tbpk Commonly known as: Chantix Starting Month Pak Take 0.5 mg by mouth daily for 3 days, THEN 0.5 mg in the morning and at bedtime for 4 days, THEN 1 mg in the morning and at bedtime for 21 days. Start taking on: December 19, 2023   varenicline 1 MG tablet Commonly known as: Chantix Continuing Month Pak Take 1 tablet (1 mg total) by mouth 2 (two) times daily.        DISPOSITION AND FOLLOW-UP:  Ruben Pierce was discharged from [facility] in Good condition. At the hospital follow up visit please address:  Alcohol use disorder: Admitted for alcohol withdrawal seizures. Reassess alcohol use. Ensure adherence to naltrexone, multivitamins, folate, thiamine. If he relapses, encourage patient to consider rehab. Hypomagnesemia on admission, consider rechecking.   Tobacco use disorder: Reassess tobacco use. Ensure adherence to chantix. If he starts this medication successfully, consider extending his prescription. Can also consider nicorette.   Type 2 diabetes mellitus: A1c 6.6. On metformin  2000 mg daily. Consider microalbumin, eye exam.   HTN: well-controlled during hospitalization on amlodipine and enalapril. Recheck outpatient and adjust medications as indicated.   Follow-up Appointments:  Follow-up Information     [facility] Medicaid Transportation. Call.   Why: Please call to schedule Medicaid transportation for doctor's appointments. Contact information: [phone]        Lovie Macadamia, MD. Go on 12/25/2023.   Specialty: Internal  Medicine Why: At 10:45 am. Contact information: [address] [place] [address] [phone]         [facility]Center. Schedule an appointment as soon as possible for a visit.   Why: Please call office to schedule appointment for outpatient occupational therapy. Contact information: [address] [place] [address] [phone]                 HOSPITAL COURSE:   Alcohol use disorder Altered mental status, resolved He presented to the emergency department after being found at home unresponsive surrounded by pill bottles, responsive only to painful stimuli.  He initially required oxygen.  He had a slight respiratory alkalosis, mild leukocytosis.  CT head, cervical spine, and chest x-ray were ordered which did not show any acute findings.  EKG showed sinus tachycardia.  Salicylate level was negative.  Acetaminophen level was negative.  Liver enzymes were elevated.  Urinalysis showed no evidence of acute infection.  Urine drug screen was positive for THC.  Respiratory viral panel was negative.  He was admitted to the hospital for suspected alcohol and possible prescription drug overdose.  He was started on CIWA protocol with Ativan.  Despite this, he went into alcohol withdrawal seizure-like activity and was given repeated doses of 2 mg of IV Ativan.  After several doses, he rested comfortably until the next day.  When he awoke, he had returned to his baseline.  His liver enzymes trended downward during his hospital stay.  He remained hemodynamically stable.  He is motivated to stop using alcohol and tobacco so frequently, so we have started him on naltrexone and Chantix at discharge.  Is medically stable for outpatient follow-up with his PCP.   Hypomagnesemia, resolved Magnesium was initially 1.6 on admission.  Repleted with IV magnesium.  Likely secondary to chronic alcohol use.   Elevated Liver Enzymes Likely in the setting of chronic alcohol use but  may also be some metabolic contribution to this given his diabetes.   Hypertension Well-controlled throughout his hospitalization on amlodipine and enalapril.  Type 2 Diabetes Mellitus Hemoglobin A1c is 6.6.  He is on metformin 2000 mg daily.    DISCHARGE INSTRUCTIONS:   Discharge Instructions     Ambulatory referral to Occupational Therapy   Complete by: As directed    Call MD for:  difficulty breathing, headache or visual disturbances   Complete by: As directed    Call MD for:  extreme fatigue   Complete by: As directed    Call MD for:  hives   Complete by: As directed    Call MD for:  persistant dizziness or light-headedness   Complete by: As directed    Call MD for:  persistant nausea and vomiting   Complete by: As directed    Call MD for:  redness, tenderness, or signs of infection (pain, swelling, redness, odor or green/yellow discharge around incision site)   Complete by: As directed    Call MD for:  severe uncontrolled pain   Complete by: As directed    Call  MD for:  temperature >100.4   Complete by: As directed    Diet general   Complete by: As directed    Discharge instructions   Complete by: As directed    You were hospitalized for alcohol withdrawal seizures. You were treated and are now medically stable for discharge. Thank you for allowing [place] to be part of your care.   We arranged for you to follow up at: [facility] on the ground floor of this hospital on 12/25/2023 at 10:45 AM.   Please note these changes made to your medications:   Please START taking:   Naltrexone 50 mg daily for reducing alcohol cravings  Chantix 0.5 mg (1 tablet) daily for 3 days, then 0.5 mg twice daily for 4 days, then 1 mg twice daily for the next 7 weeks.  Choose a smoking quit date 1 week after starting this medication.  This is a medication to help you stop smoking tobacco.  Multivitamin, folate, and thiamine once daily. These are important vitamins to take given  your history of alcohol use.   You may keep taking your other medications as prescribed.  Please call our clinic if you have any questions or concerns, we may be able to help and keep you from a long and expensive emergency room wait. Our clinic and after hours phone number is [phone], the best time to call is Monday through Friday 9 am to 4 pm but there is always someone available 24/7 if you have an emergency. If you need medication refills please notify your pharmacy one week in advance and they will send [place] a request.   Increase activity slowly   Complete by: As directed        SUBJECTIVE:   Doing well this morning. No concerns, says he would like to go home.   Discharge Vitals:   BP 117/77 (BP Location: Left Arm)   Pulse 93   Temp 98.2 F (36.8 C) (Oral)   Resp 17   Ht 6\' 1"  (1.854 m)   SpO2 99%   BMI 24.61 kg/m   OBJECTIVE:  Physical Exam Constitutional:      Appearance: Normal appearance.  HENT:     Head: Normocephalic and atraumatic.     Nose: Nose normal.     Mouth/Throat:     Mouth: Mucous membranes are moist.     Pharynx: Oropharynx is clear.  Eyes:     Extraocular Movements: Extraocular movements intact.     Pupils: Pupils are equal, round, and reactive to light.  Cardiovascular:     Rate and Rhythm: Normal rate and regular rhythm.     Pulses: Normal pulses.     Heart sounds: Normal heart sounds.  Pulmonary:     Effort: Pulmonary effort is normal.     Breath sounds: Normal breath sounds.  Abdominal:     General: Abdomen is flat.     Palpations: Abdomen is soft.  Musculoskeletal:        General: Normal range of motion.     Cervical back: Normal range of motion.  Neurological:     General: No focal deficit present.     Mental Status: He is alert and oriented to person, place, and time. Mental status is at baseline.  Psychiatric:        Mood and Affect: Mood normal.        Behavior: Behavior normal.     Pertinent Labs, Studies, and Procedures:      Latest Ref  Rng & Units 12/16/2023    6:25 AM 12/14/2023    6:54 AM 12/13/2023    3:45 PM  CBC  WBC 4.0 - 10.5 K/uL 8.7  8.1    Hemoglobin 13.0 - 17.0 g/dL 40.9  81.1  91.4   Hematocrit 39.0 - 52.0 % 43.7  42.8  47.0   Platelets 150 - 400 K/uL 249  208         Latest Ref Rng & Units 12/18/2023    5:01 AM 12/16/2023    6:25 AM 12/14/2023    6:54 AM  CMP  Glucose 70 - 99 mg/dL 782  956  213   BUN 6 - 20 mg/dL 19  14  9    Creatinine 0.61 - 1.24 mg/dL 0.86  5.78  4.69   Sodium 135 - 145 mmol/L 131  134  137   Potassium 3.5 - 5.1 mmol/L 3.9  3.8  3.9   Chloride 98 - 111 mmol/L 95  97  100   CO2 22 - 32 mmol/L 23  28  25    Calcium 8.9 - 10.3 mg/dL 9.2  9.2  9.1   Total Protein 6.5 - 8.1 g/dL 6.3  6.7    Total Bilirubin 0.0 - 1.2 mg/dL 0.3  0.6    Alkaline Phos 38 - 126 U/L 82  106    AST 15 - 41 U/L 37  54    ALT 0 - 44 U/L 77  100     CT HEAD WO CONTRAST Result Date: 12/13/2023 IMPRESSION: 1. No CT evidence for acute intracranial abnormality. Atrophy and chronic small vessel ischemic changes of the white matter. 2. Degenerative changes of the cervical spine. No acute osseous abnormality. Electronically Signed   By: Jasmine Pang M.D.   On: 12/13/2023 19:52   CT CERVICAL SPINE WO CONTRAST Result Date: 12/13/2023 IMPRESSION: 1. No CT evidence for acute intracranial abnormality. Atrophy and chronic small vessel ischemic changes of the white matter. 2. Degenerative changes of the cervical spine. No acute osseous abnormality. Electronically Signed   By: Jasmine Pang M.D.   On: 12/13/2023 19:52   DG Chest Port 1 View Result Date: 12/13/2023 CLINICAL DATA:  Altered mental status.  Shortness of breath. EXAM: PORTABLE CHEST 1 VIEW COMPARISON:  07/12/2023 FINDINGS: The cardiomediastinal contours are normal. Unchanged calcified granuloma in the right upper lobe. Pulmonary vasculature is normal. No consolidation, pleural effusion, or pneumothorax. No acute osseous abnormalities are seen.  IMPRESSION: No active disease. Electronically Signed   By: Narda Rutherford M.D.   On: 12/13/2023 19:08     Signed: Annett Fabian, MD Internal Medicine Resident, PGY-1 [facility]  Pager: [phone] 2:55 PM, 12/19/2023

## 2023-12-19 NOTE — Plan of Care (Signed)

## 2023-12-24 ENCOUNTER — Other Ambulatory Visit: Payer: Self-pay

## 2023-12-24 ENCOUNTER — Encounter (HOSPITAL_COMMUNITY): Payer: Self-pay

## 2023-12-24 ENCOUNTER — Emergency Department (HOSPITAL_COMMUNITY)

## 2023-12-24 ENCOUNTER — Observation Stay (HOSPITAL_COMMUNITY)
Admission: EM | Admit: 2023-12-24 | Discharge: 2023-12-25 | Disposition: A | Discharge status: Home / Self Care | Attending: Internal Medicine | Admitting: Internal Medicine

## 2023-12-24 DIAGNOSIS — F1099 Alcohol use, unspecified with unspecified alcohol-induced disorder: Secondary | ICD-10-CM | POA: Diagnosis not present

## 2023-12-24 DIAGNOSIS — E119 Type 2 diabetes mellitus without complications: Secondary | ICD-10-CM | POA: Diagnosis not present

## 2023-12-24 DIAGNOSIS — G4700 Insomnia, unspecified: Secondary | ICD-10-CM | POA: Diagnosis not present

## 2023-12-24 DIAGNOSIS — Z79899 Other long term (current) drug therapy: Secondary | ICD-10-CM | POA: Insufficient documentation

## 2023-12-24 DIAGNOSIS — E785 Hyperlipidemia, unspecified: Secondary | ICD-10-CM | POA: Diagnosis not present

## 2023-12-24 DIAGNOSIS — I959 Hypotension, unspecified: Secondary | ICD-10-CM | POA: Diagnosis not present

## 2023-12-24 DIAGNOSIS — R0902 Hypoxemia: Secondary | ICD-10-CM | POA: Diagnosis not present

## 2023-12-24 DIAGNOSIS — I10 Essential (primary) hypertension: Secondary | ICD-10-CM | POA: Diagnosis not present

## 2023-12-24 DIAGNOSIS — Z716 Tobacco abuse counseling: Secondary | ICD-10-CM | POA: Diagnosis not present

## 2023-12-24 DIAGNOSIS — Z87898 Personal history of other specified conditions: Secondary | ICD-10-CM | POA: Insufficient documentation

## 2023-12-24 DIAGNOSIS — N179 Acute kidney failure, unspecified: Secondary | ICD-10-CM

## 2023-12-24 DIAGNOSIS — R7401 Elevation of levels of liver transaminase levels: Secondary | ICD-10-CM | POA: Insufficient documentation

## 2023-12-24 DIAGNOSIS — Z7984 Long term (current) use of oral hypoglycemic drugs: Secondary | ICD-10-CM | POA: Diagnosis not present

## 2023-12-24 DIAGNOSIS — J449 Chronic obstructive pulmonary disease, unspecified: Secondary | ICD-10-CM | POA: Diagnosis not present

## 2023-12-24 DIAGNOSIS — E871 Hypo-osmolality and hyponatremia: Secondary | ICD-10-CM | POA: Diagnosis not present

## 2023-12-24 DIAGNOSIS — R42 Dizziness and giddiness: Secondary | ICD-10-CM

## 2023-12-24 DIAGNOSIS — Z743 Need for continuous supervision: Secondary | ICD-10-CM | POA: Diagnosis not present

## 2023-12-24 HISTORY — DX: Acute kidney failure, unspecified: N17.9

## 2023-12-24 LAB — CBC WITH DIFFERENTIAL/PLATELET
Abs Immature Granulocytes: 0.03 10*3/uL (ref 0.00–0.07)
Basophils Absolute: 0 10*3/uL (ref 0.0–0.1)
Basophils Relative: 0 %
Eosinophils Absolute: 0 10*3/uL (ref 0.0–0.5)
Eosinophils Relative: 0 %
HCT: 39.6 % (ref 39.0–52.0)
Hemoglobin: 13.3 g/dL (ref 13.0–17.0)
Immature Granulocytes: 0 %
Lymphocytes Relative: 33 %
Lymphs Abs: 3.5 10*3/uL (ref 0.7–4.0)
MCH: 27.2 pg (ref 26.0–34.0)
MCHC: 33.6 g/dL (ref 30.0–36.0)
MCV: 81 fL (ref 80.0–100.0)
Monocytes Absolute: 1.1 10*3/uL — ABNORMAL HIGH (ref 0.1–1.0)
Monocytes Relative: 10 %
Neutro Abs: 6 10*3/uL (ref 1.7–7.7)
Neutrophils Relative %: 57 %
Platelets: 374 10*3/uL (ref 150–400)
RBC: 4.89 MIL/uL (ref 4.22–5.81)
RDW: 14.6 % (ref 11.5–15.5)
WBC: 10.7 10*3/uL — ABNORMAL HIGH (ref 4.0–10.5)
nRBC: 0 % (ref 0.0–0.2)

## 2023-12-24 LAB — URINALYSIS, ROUTINE W REFLEX MICROSCOPIC
Bacteria, UA: NONE SEEN
Bilirubin Urine: NEGATIVE
Glucose, UA: >=500 mg/dL — AB
Hgb urine dipstick: NEGATIVE
Ketones, ur: 5 mg/dL — AB
Leukocytes,Ua: NEGATIVE
Nitrite: NEGATIVE
Protein, ur: 30 mg/dL — AB
Specific Gravity, Urine: 1.014 (ref 1.005–1.030)
pH: 5 (ref 5.0–8.0)

## 2023-12-24 LAB — COMPREHENSIVE METABOLIC PANEL
ALT: 60 U/L — ABNORMAL HIGH (ref 0–44)
AST: 78 U/L — ABNORMAL HIGH (ref 15–41)
Albumin: 3.8 g/dL (ref 3.5–5.0)
Alkaline Phosphatase: 104 U/L (ref 38–126)
Anion gap: 15 (ref 5–15)
BUN: 30 mg/dL — ABNORMAL HIGH (ref 6–20)
CO2: 24 mmol/L (ref 22–32)
Calcium: 8.9 mg/dL (ref 8.9–10.3)
Chloride: 93 mmol/L — ABNORMAL LOW (ref 98–111)
Creatinine, Ser: 2.32 mg/dL — ABNORMAL HIGH (ref 0.61–1.24)
GFR, Estimated: 32 mL/min — ABNORMAL LOW (ref >60)
Glucose, Bld: 142 mg/dL — ABNORMAL HIGH (ref 70–99)
Potassium: 3.8 mmol/L (ref 3.5–5.1)
Sodium: 132 mmol/L — ABNORMAL LOW (ref 135–145)
Total Bilirubin: 0.6 mg/dL (ref 0.0–1.2)
Total Protein: 6.5 g/dL (ref 6.5–8.1)

## 2023-12-24 LAB — SALICYLATE LEVEL: Salicylate Lvl: <7 mg/dL — ABNORMAL LOW (ref 7.0–30.0)

## 2023-12-24 LAB — RAPID URINE DRUG SCREEN, HOSP PERFORMED
Amphetamines: NOT DETECTED
Barbiturates: NOT DETECTED
Benzodiazepines: POSITIVE — AB
Cocaine: NOT DETECTED
Opiates: NOT DETECTED
Tetrahydrocannabinol: POSITIVE — AB

## 2023-12-24 LAB — ETHANOL: Alcohol, Ethyl (B): <10 mg/dL (ref <10)

## 2023-12-24 LAB — TROPONIN I (HIGH SENSITIVITY)
Troponin I (High Sensitivity): 36 ng/L — ABNORMAL HIGH (ref <18)
Troponin I (High Sensitivity): 35 ng/L — ABNORMAL HIGH (ref <18)

## 2023-12-24 LAB — CK: Total CK: 990 U/L — ABNORMAL HIGH (ref 49–397)

## 2023-12-24 LAB — ACETAMINOPHEN LEVEL: Acetaminophen (Tylenol), Serum: <10 ug/mL — ABNORMAL LOW (ref 10–30)

## 2023-12-24 MED ORDER — PANTOPRAZOLE SODIUM 40 MG PO TBEC
40.0000 mg | DELAYED_RELEASE_TABLET | Freq: Every day | ORAL | Status: DC
  Administered 2023-12-25: 40 mg via ORAL
  Filled 2023-12-24: qty 1

## 2023-12-24 MED ORDER — ACETAMINOPHEN 325 MG PO TABS: 650.0000 mg | ORAL_TABLET | Freq: Four times a day (QID) | ORAL | Status: DC | PRN

## 2023-12-24 MED ORDER — ENOXAPARIN SODIUM 40 MG/0.4ML IJ SOSY
40.0000 mg | PREFILLED_SYRINGE | Freq: Every day | INTRAMUSCULAR | Status: DC
Start: 2023-12-25 — End: 2023-12-25
  Administered 2023-12-25: 40 mg via SUBCUTANEOUS
  Filled 2023-12-24: qty 0.4

## 2023-12-24 MED ORDER — LORAZEPAM 1 MG PO TABS: 0.0000 mg | ORAL_TABLET | Freq: Two times a day (BID) | ORAL | Status: DC

## 2023-12-24 MED ORDER — ACETAMINOPHEN 500 MG PO TABS
1000.0000 mg | ORAL_TABLET | Freq: Once | ORAL | Status: AC
Start: 2023-12-24 — End: 2023-12-24
  Administered 2023-12-24: 1000 mg via ORAL
  Filled 2023-12-24: qty 2

## 2023-12-24 MED ORDER — LACTATED RINGERS IV BOLUS
1000.0000 mL | Freq: Once | INTRAVENOUS | Status: AC
  Administered 2023-12-24: 1000 mL via INTRAVENOUS

## 2023-12-24 MED ORDER — ARFORMOTEROL TARTRATE 15 MCG/2ML IN NEBU
15.0000 ug | INHALATION_SOLUTION | Freq: Two times a day (BID) | RESPIRATORY_TRACT | Status: DC
  Administered 2023-12-25: 15 ug via RESPIRATORY_TRACT
  Filled 2023-12-24: qty 2

## 2023-12-24 MED ORDER — THIAMINE HCL 100 MG/ML IJ SOLN: 100.0000 mg | Freq: Every day | INTRAMUSCULAR | Status: DC

## 2023-12-24 MED ORDER — LORAZEPAM 2 MG/ML IJ SOLN: 0.0000 mg | Freq: Four times a day (QID) | INTRAMUSCULAR | Status: DC

## 2023-12-24 MED ORDER — ACETAMINOPHEN 650 MG RE SUPP: 650.0000 mg | Freq: Four times a day (QID) | RECTAL | Status: DC | PRN

## 2023-12-24 MED ORDER — SENNOSIDES-DOCUSATE SODIUM 8.6-50 MG PO TABS: 1.0000 | ORAL_TABLET | Freq: Every evening | ORAL | Status: DC | PRN

## 2023-12-24 MED ORDER — UMECLIDINIUM BROMIDE 62.5 MCG/ACT IN AEPB
1.0000 | INHALATION_SPRAY | Freq: Every day | RESPIRATORY_TRACT | Status: DC
  Administered 2023-12-25: 1 via RESPIRATORY_TRACT
  Filled 2023-12-24: qty 7

## 2023-12-24 MED ORDER — THIAMINE MONONITRATE 100 MG PO TABS
100.0000 mg | ORAL_TABLET | Freq: Every day | ORAL | Status: DC
  Administered 2023-12-25: 100 mg via ORAL
  Filled 2023-12-24: qty 1

## 2023-12-24 MED ORDER — LORAZEPAM 1 MG PO TABS: 0.0000 mg | ORAL_TABLET | Freq: Four times a day (QID) | ORAL | Status: DC

## 2023-12-24 MED ORDER — LORAZEPAM 2 MG/ML IJ SOLN: 0.0000 mg | Freq: Two times a day (BID) | INTRAMUSCULAR | Status: DC

## 2023-12-24 NOTE — ED Triage Notes (Signed)
 PER EMS: pt is from home with c/o dizziness. He was admitted here to [facility] for overdose and was discharged last week. Over the weekend, he reports he drank beer and smoked weed with friends, family did not know where he was. He reports a drink 12 pack all weekend. Family called 911 because he was complaining of dizziness.   BP- 90/54, HR-96, RR-16, 95% RA, CBG-185  18g to R A/C and received 500cc of normal saline.

## 2023-12-24 NOTE — ED Provider Notes (Signed)
 [facility] Emergency Department Provider Note MRN:  [id number]  Arrival date & time: 12/24/23     Chief Complaint   Dizziness   History of Present Illness   Ruben Pierce is a 58 y.o. year-old male presents to the ED with chief complaint of dizziness.  States that he has felt quite dizzy when he stands up over the past couple of days.  States that he hasn't really been drinking anything other than alcohol this weekend.  He states that he used a little bit of marijuana, but denies any other drug use.  He denies chest pain or SOB.  Denies any recent illnesses.  Recently admitting for ETOH withdrawal.  History provided by patient.   Review of Systems  Pertinent positive and negative review of systems noted in HPI.    Physical Exam   Vitals:   12/24/23 1718 12/24/23 1921  BP: 92/60 96/65  Pulse: 99 93  Resp: 15 19  Temp: 98.2 F (36.8 C) 98.1 F (36.7 C)  SpO2: 98% 100%    CONSTITUTIONAL:  non toxic-appearing, NAD NEURO:  Alert and oriented x 3, CN 3-12 grossly intact EYES:  eyes equal and reactive ENT/NECK:  Supple, no stridor  CARDIO:  normal rate, regular rhythm, appears well-perfused  PULM:  No respiratory distress, CTAB GI/GU:  non-distended,  MSK/SPINE:  No gross deformities, no edema, moves all extremities  SKIN:  no rash, atraumatic   *Additional and/or pertinent findings included in MDM below  Diagnostic and Interventional Summary    EKG Interpretation Date/Time:    Ventricular Rate:    PR Interval:    QRS Duration:    QT Interval:    QTC Calculation:   R Axis:      Text Interpretation:         Labs Reviewed  CBC WITH DIFFERENTIAL/PLATELET - Abnormal; Notable for the following components:      Result Value   WBC 10.7 (*)    Monocytes Absolute 1.1 (*)    All other components within normal limits  COMPREHENSIVE METABOLIC PANEL - Abnormal; Notable for the following components:   Sodium 132 (*)    Chloride 93 (*)     Glucose, Bld 142 (*)    BUN 30 (*)    Creatinine, Ser 2.32 (*)    AST 78 (*)    ALT 60 (*)    GFR, Estimated 32 (*)    All other components within normal limits  TROPONIN I (HIGH SENSITIVITY) - Abnormal; Notable for the following components:   Troponin I (High Sensitivity) 36 (*)    All other components within normal limits  ETHANOL  URINALYSIS, ROUTINE W REFLEX MICROSCOPIC  RAPID URINE DRUG SCREEN, HOSP PERFORMED  CK  ACETAMINOPHEN LEVEL  SALICYLATE LEVEL  TROPONIN I (HIGH SENSITIVITY)    DG Chest 2 View  Final Result      Medications  lactated ringers bolus 1,000 mL (has no administration in time range)  lactated ringers bolus 1,000 mL (has no administration in time range)  LORazepam (ATIVAN) injection 0-4 mg (has no administration in time range)    Or  LORazepam (ATIVAN) tablet 0-4 mg (has no administration in time range)  LORazepam (ATIVAN) injection 0-4 mg (has no administration in time range)    Or  LORazepam (ATIVAN) tablet 0-4 mg (has no administration in time range)  thiamine (VITAMIN B1) tablet 100 mg (has no administration in time range)    Or  thiamine (VITAMIN B1) injection 100 mg (has  no administration in time range)  acetaminophen (TYLENOL) tablet 1,000 mg (1,000 mg Oral Given 12/24/23 1810)     Procedures  /  Critical Care .Critical Care  Performed by: Roxy Horseman, PA-C Authorized by: Roxy Horseman, PA-C   Critical care provider statement:    Critical care time (minutes):  42   Critical care was necessary to treat or prevent imminent or life-threatening deterioration of the following conditions:  Circulatory failure   Critical care was time spent personally by me on the following activities:  Development of treatment plan with patient or surrogate, discussions with consultants, evaluation of patient's response to treatment, examination of patient, ordering and review of laboratory studies, ordering and review of radiographic studies, ordering and  performing treatments and interventions, pulse oximetry, re-evaluation of patient's condition and review of old charts   ED Course and Medical Decision Making  I have reviewed the triage vital signs, the nursing notes, and pertinent available records from the EMR.  Social Determinants Affecting Complexity of Care: Patient has no clinically significant social determinants affecting this chief complaint..   ED Course: Clinical Course as of 12/24/23 2219  Mon Dec 24, 2023  2216 Comprehensive metabolic panel(!) New AKI, creatinine 2.32, was 0.97 on 3/18. [RB]  2216 Troponin I (High Sensitivity)(!) Mildly elevated, no CP, thought 2/2 kidney function, continue to trend. [RB]    Clinical Course User Index [RB] Roxy Horseman, PA-C    Medical Decision Making Patient here with dizziness.  States he feels dizzy when he stands up for the past few days.    States he has only been drinking ETOH.    Labs discussed below.  Concern for new AKI.  Will add CK.  Will need admission for fluids.   Amount and/or Complexity of Data Reviewed Labs: ordered. Decision-making details documented in ED Course.  Risk OTC drugs. Prescription drug management. Decision regarding hospitalization.         Consultants: I consulted with IMTS, who are appreciated for admitting.    Treatment and Plan: Patient's exam and diagnostic results are concerning for AKI.  Feel that patient will need admission to the hospital for further treatment and evaluation.    Final Clinical Impressions(s) / ED Diagnoses     ICD-10-CM   1. AKI (acute kidney injury) (HCC)  N17.9     2. Dizziness  R42       ED Discharge Orders     None         Discharge Instructions Discussed with and Provided to Patient:   Discharge Instructions   None      Roxy Horseman, PA-C 12/24/23 2236    Melene Plan, DO 12/24/23 2237

## 2023-12-24 NOTE — H&P (Incomplete)
 Date: 12/24/2023               Patient Name:  Ruben Pierce MRN: [id number]  DOB: 1966-01-09 Age / Sex: 58 y.o., male   PCP: Rocky Morel, DO         Medical Service: Internal Medicine Teaching Service         Attending Physician: Dr. Melene Plan, DO      First Contact: Dr. Annett Fabian, MD Pager [phone]    Second Contact: Dr. Modena Slater, DO Pager [phone]         After Hours (After 5p/  First Contact Pager: [phone]  weekends / holidays): Second Contact Pager: [phone]   SUBJECTIVE   Chief Complaint: dizziness   History of Present Illness: Ruben Pierce is a 58 y.o. male with PMH of T2DM, HTN, HLD, COPD and EtOH use disorder.   Presents with dizziness for past day. Describes as room spinning and lightheaded when he was taking his dogs out and at times sitting upright. States his appetite is poor for past few days and has not been staying hydrated. States he barely drinks water and mainly has been drinking alcohol. Still endorses drinking beer about 15 pack a day. Lives with brother and sister. Endorses some SOB but none at this time. No productive cough. Denies vision changes, n/v/d, abdominal pain, chest pain, fever. No recent illness or sick contacts. No urinary symptoms and no change in urinary frequency. Denies any recent NSAID use except for Goody powder about 2-3 weeks ago (not frequently used).   Was recently hospitalized for alcohol withdrawal and discharged on 3/19 with naltrexone. Endorses taking some of his medications but not all. Denies any new medications since discharge.   ED Course: Vitals were hemodynamically stable with low-normal BP. Afebrile.  Labs significant for hyponatremia, Scr 2.32 from BL 0.9-1.1, mildly elevated AST/ALT. CK 990. Troponin elevated but flat. Negative EtOH, acetaminophen and salicylate. UDS positive for benzo and THC.  CXR without acute findings.  Received 2L LR bolus.   Meds:  -amitriptyline 10 mg at bedtime (taking) -amlodipine 10 mg  (not taking) -lipitor 80 mg (unsure) -enalapril 10 mg (taking) -Pepcid 20 mg BID (unsure) -invokana 300 mg (unsure) -metformin 1000 mg BID  -naltrexone 50 mg daily (unsure) -Protonix 40 mg (unsure) -Stiolto daily(taking) -vitamin B1 (not taking) -Chantix (taking)  No outpatient medications have been marked as taking for the 12/24/23 encounter [facility] Encounter).    Past Medical History Past Medical History:  Diagnosis Date  . Asthma   . Counseling regarding advanced directives and goals of care 12/01/2020   DNR/DNI MOST form filled out 12/01/20  . ETOH abuse   . Hypertension   . TBI (traumatic brain injury) (HCC)   . Type 2 diabetes mellitus, uncontrolled 2010    Past Surgical History Past Surgical History:  Procedure Laterality Date  . BRAIN SURGERY  1999   Head injury, fell from tree 25 feet  . BUNIONECTOMY Right 2014    Social:  Lives With: brother and sister Support: family  Level of Function: independent with ADL, no driving, uses a cane  PCP: Rocky Morel, DO Substances: -Tobacco: 1/2 pack daily since 58 yo  -Alcohol: 15 pack of beer daily (last drink was 3/23 PM)  -Recreational Drug: marijuana daily   Family History:  Family History  Problem Relation Age of Onset  . CAD Mother   . Diabetes Mellitus II Father   . Hypertension Father   . Hyperlipidemia Father   . Colon  cancer Neg Hx   . Esophageal cancer Neg Hx   . Rectal cancer Neg Hx   . Stomach cancer Neg Hx     Allergies: Allergies as of 12/24/2023  . (No Known Allergies)    Review of Systems: A complete ROS was negative except as per HPI.   OBJECTIVE:   Physical Exam: Blood pressure 96/65, pulse 93, temperature 98.1 F (36.7 C), resp. rate 19, height 6\' 1"  (1.854 m), weight 84.4 kg, SpO2 100%.  Constitutional: alert, laying in bed comfortably, in no acute distress HENT: normocephalic atraumatic, mucous membranes dry Eyes: no nystagmus, EOMI Cardiovascular: RRR, no murmur   Pulmonary/Chest: normal work of breathing on room air, lungs clear to auscultation bilaterally Abdominal: bowel sounds present, soft, non-tender, non-distended MSK: no LE edema Neurological: alert & oriented x 3, CN II-XII intact, 5/5 strength in bilateral upper and lower extremities, no asterixis   Skin: warm and dry, decreased skin turgor Psych: pleasant mood  Labs: CBC    Component Value Date/Time   WBC 10.7 (H) 12/24/2023 1815   RBC 4.89 12/24/2023 1815   HGB 13.3 12/24/2023 1815   HGB 13.7 05/03/2023 1639   HCT 39.6 12/24/2023 1815   HCT 40.3 05/03/2023 1639   PLT 374 12/24/2023 1815   PLT 322 05/03/2023 1639   MCV 81.0 12/24/2023 1815   MCV 80 05/03/2023 1639   MCH 27.2 12/24/2023 1815   MCHC 33.6 12/24/2023 1815   RDW 14.6 12/24/2023 1815   RDW 15.2 05/03/2023 1639   LYMPHSABS 3.5 12/24/2023 1815   MONOABS 1.1 (H) 12/24/2023 1815   EOSABS 0.0 12/24/2023 1815   BASOSABS 0.0 12/24/2023 1815     CMP     Component Value Date/Time   NA 132 (L) 12/24/2023 1815   NA 136 05/03/2023 1639   K 3.8 12/24/2023 1815   CL 93 (L) 12/24/2023 1815   CO2 24 12/24/2023 1815   GLUCOSE 142 (H) 12/24/2023 1815   BUN 30 (H) 12/24/2023 1815   BUN 13 05/03/2023 1639   CREATININE 2.32 (H) 12/24/2023 1815   CALCIUM 8.9 12/24/2023 1815   PROT 6.5 12/24/2023 1815   PROT 7.0 05/03/2023 1639   ALBUMIN 3.8 12/24/2023 1815   ALBUMIN 4.5 05/03/2023 1639   AST 78 (H) 12/24/2023 1815   ALT 60 (H) 12/24/2023 1815   ALKPHOS 104 12/24/2023 1815   BILITOT 0.6 12/24/2023 1815   BILITOT <0.2 05/03/2023 1639   GFRNONAA 32 (L) 12/24/2023 1815   GFRAA 100 09/15/2020 1356    Imaging: DG Chest 2 View CLINICAL DATA:  Lightheaded.  EXAM: CHEST - 2 VIEW  COMPARISON:  December 13, 2023  FINDINGS: The heart size and mediastinal contours are within normal limits. There is no evidence of an acute infiltrate, pleural effusion or pneumothorax. The visualized skeletal structures are  unremarkable.  IMPRESSION: No active cardiopulmonary disease.  Electronically Signed   By: Aram Candela M.D.   On: 12/24/2023 19:57   EKG: personally reviewed my interpretation is sinus rhythm, rate 96, normal axis and intervals. Unchanged from prior EKG.  ASSESSMENT & PLAN:   Assessment & Plan by Problem: Principal Problem:   AKI (acute kidney injury) (HCC)   Mikko Lewellen is a 58 y.o. person living with a history of T2DM, HTN, HLD, COPD and EtOH use disorder who presented with dizziness and admitted for AKI on hospital day 0  # Dizziness # AKI Scr 2.32 from last 0.97 baseline. BUN/Scr ratio suggestive of prerenal etiology. Exam with signs  of hypovolemia. No focal neuro deficits on exam. Suspect orthostatic in setting of dehydration. CK 990. S/p 2L LR boluses in ED.  - encourage po fluid intake - trend renal function - strict I&O, monitor UOP - check orthostatics  - avoid nephrotoxic agents   # EtOH use # Hx of withdrawal seizure  Recently admitted for this with noted seizure like activity. D/c with naltrexone but unclear if taking. He is interested in alcohol and tobacco cessation. Last drink was 3/23 PM. Mildly elevated AST/ALT.  - CIWA w/ ativan  - thiamine, MVI, folic acid  - trend liver enzymes  - discussion on resuming naltrexone at discharge  Elevated troponin Troponin 36>35. Denies any chest pain. No acute ischemic changes on EKG compared to previous EKG.   T2DM, controlled Last A1c 6.6. Holding home metformin and Invokana.   HTN  Low normal BP on arrival. Receiving IVF.  - hold enalapril in setting of AKI, hold amlodipine   COPD Tobacco use - continue home Stiolto (hospital formulary) - nicotine patch daily   Insomnia: amitriptyline 100 mg at bedtime  HLD: unclear if taking Lipitor 80 mg   Diet: Normal VTE: Enoxaparin IVF: LR,Bolus Code: Full (discussed with patient and confirmed Full code status) Surrogate Decision Maker: sister  Junious Dresser)  Prior to Admission Living Arrangement: Home, living brother and sister Anticipated Discharge Location: Home Barriers to Discharge: AKI  Dispo: Admit patient to Observation with expected length of stay less than 2 midnights.  Signed: Rana Snare, DO Internal Medicine Resident PGY-2 Pager: [phone] 12/24/2023, 10:42 PM   Please contact on-call pager, weekdays after 5pm and weekends after 1pm, at [phone].

## 2023-12-24 NOTE — ED Provider Triage Note (Signed)
 Emergency Medicine Provider Triage Evaluation Note  Ruben Pierce , a 58 y.o. male  was evaluated in triage.  Pt complains of feeling lightheaded for the last few days. Smoked marijuana and drank this weekend. Recently admitted for OD. No chest pain.  Review of Systems  Positive: lightheadedness Negative: Chest pain  Physical Exam  BP 92/60 (BP Location: Left Arm)   Pulse 99   Temp 98.2 F (36.8 C) (Oral)   Resp 15   Ht 6\' 1"  (1.854 m)   Wt 84.4 kg   SpO2 98%   BMI 24.54 kg/m  Gen:   Awake, no distress   Resp:  Normal effort  MSK:   Moves extremities without difficulty  Other:  No neurodeficits  Medical Decision Making  Medically screening exam initiated at 5:35 PM.  Appropriate orders placed.  Ruben Pierce was informed that the remainder of the evaluation will be completed by another provider, this initial triage assessment does not replace that evaluation, and the importance of remaining in the ED until their evaluation is complete.     Pete Pelt, [place] 12/24/23 1736

## 2023-12-24 NOTE — H&P (Incomplete)
 Date: 12/24/2023               Patient Name:  Ruben Pierce MRN: [id number]  DOB: 22-Aug-1966 Age / Sex: 58 y.o., male   PCP: Rocky Morel, DO         Medical Service: Internal Medicine Teaching Service         Attending Physician: Dr. Reymundo Poll, MD      First Contact: Dr. Annett Fabian, MD Pager [phone]    Second Contact: Dr. Modena Slater, DO Pager [phone]         After Hours (After 5p/  First Contact Pager: [phone]  weekends / holidays): Second Contact Pager: [phone]   SUBJECTIVE   Chief Complaint: dizziness   History of Present Illness: Ruben Pierce is a 58 y.o. male with PMH of T2DM, HTN, HLD, COPD and EtOH use disorder.   Presents with dizziness for past day. Describes as room spinning and lightheaded when he was taking his dogs out and at times sitting upright. States his appetite is poor for past few days and has not been staying hydrated. States he barely drinks water and mainly has been drinking alcohol. Still endorses drinking beer about 15 pack a day. Lives with brother and sister. Endorses some SOB but none at this time. No productive cough. Denies vision changes, numbness, focal weakness, n/v/d, abdominal pain, chest pain, fever. No recent illness or sick contacts. No urinary symptoms and no change in urinary frequency. Denies any recent NSAID use except for Goody powder about 2-3 weeks ago (not frequently used).   Was recently hospitalized for alcohol withdrawal and discharged on 3/19 with naltrexone. Endorses taking some of his medications but not all. Denies any new medications since discharge.   ED Course: Vitals were hemodynamically stable with low-normal BP. Afebrile.  Labs significant for hyponatremia, Scr 2.32 from BL 0.9-1.1, mildly elevated AST/ALT. CK 990. Troponin elevated but flat. Negative EtOH, acetaminophen and salicylate. UDS positive for benzo and THC.  CXR without acute findings.  Received 2L LR bolus.   Meds:  -amitriptyline 10 mg at  bedtime (taking) -amlodipine 10 mg (not taking) -lipitor 80 mg (unsure) -enalapril 10 mg (taking) -Pepcid 20 mg BID (unsure) -invokana 300 mg (unsure) -metformin 1000 mg BID  -naltrexone 50 mg daily (unsure) -Protonix 40 mg (unsure) -Stiolto daily(taking) -vitamin B1 (not taking) -Chantix (taking)  No outpatient medications have been marked as taking for the 12/24/23 encounter [facility] Encounter).    Past Medical History Past Medical History:  Diagnosis Date   Asthma    Counseling regarding advanced directives and goals of care 12/01/2020   DNR/DNI MOST form filled out 12/01/20   ETOH abuse    Hypertension    TBI (traumatic brain injury) (HCC)    Type 2 diabetes mellitus, uncontrolled 2010    Past Surgical History Past Surgical History:  Procedure Laterality Date   BRAIN SURGERY  1999   Head injury, fell from tree 25 feet   BUNIONECTOMY Right 2014    Social:  Lives With: brother and sister Support: family  Level of Function: independent with ADL, no driving, uses a cane  PCP: Rocky Morel, DO Substances: -Tobacco: 1/2 pack daily since 58 yo  -Alcohol: 15 pack of beer daily (last drink was 3/23 PM)  -Recreational Drug: marijuana daily   Family History:  Family History  Problem Relation Age of Onset   CAD Mother    Diabetes Mellitus II Father    Hypertension Father    Hyperlipidemia Father  Colon cancer Neg Hx    Esophageal cancer Neg Hx    Rectal cancer Neg Hx    Stomach cancer Neg Hx     Allergies: Allergies as of 12/24/2023   (No Known Allergies)    Review of Systems: A complete ROS was negative except as per HPI.   OBJECTIVE:   Physical Exam: Blood pressure 96/65, pulse 93, temperature 98.1 F (36.7 C), resp. rate 19, height 6\' 1"  (1.854 m), weight 84.4 kg, SpO2 100%.  Constitutional: alert, laying in bed comfortably, in no acute distress HENT: normocephalic atraumatic, mucous membranes dry Eyes: no nystagmus, EOMI Cardiovascular: RRR, no  murmur  Pulmonary/Chest: normal work of breathing on room air, lungs clear to auscultation bilaterally Abdominal: bowel sounds present, soft, non-tender, non-distended MSK: no LE edema Neurological: alert & oriented x 3, CN II-XII intact, 5/5 strength in bilateral upper and lower extremities, no asterixis   Skin: warm and dry, decreased skin turgor Psych: pleasant mood  Labs: CBC    Component Value Date/Time   WBC 10.7 (H) 12/24/2023 1815   RBC 4.89 12/24/2023 1815   HGB 13.3 12/24/2023 1815   HGB 13.7 05/03/2023 1639   HCT 39.6 12/24/2023 1815   HCT 40.3 05/03/2023 1639   PLT 374 12/24/2023 1815   PLT 322 05/03/2023 1639   MCV 81.0 12/24/2023 1815   MCV 80 05/03/2023 1639   MCH 27.2 12/24/2023 1815   MCHC 33.6 12/24/2023 1815   RDW 14.6 12/24/2023 1815   RDW 15.2 05/03/2023 1639   LYMPHSABS 3.5 12/24/2023 1815   MONOABS 1.1 (H) 12/24/2023 1815   EOSABS 0.0 12/24/2023 1815   BASOSABS 0.0 12/24/2023 1815     CMP     Component Value Date/Time   NA 132 (L) 12/24/2023 1815   NA 136 05/03/2023 1639   K 3.8 12/24/2023 1815   CL 93 (L) 12/24/2023 1815   CO2 24 12/24/2023 1815   GLUCOSE 142 (H) 12/24/2023 1815   BUN 30 (H) 12/24/2023 1815   BUN 13 05/03/2023 1639   CREATININE 2.32 (H) 12/24/2023 1815   CALCIUM 8.9 12/24/2023 1815   PROT 6.5 12/24/2023 1815   PROT 7.0 05/03/2023 1639   ALBUMIN 3.8 12/24/2023 1815   ALBUMIN 4.5 05/03/2023 1639   AST 78 (H) 12/24/2023 1815   ALT 60 (H) 12/24/2023 1815   ALKPHOS 104 12/24/2023 1815   BILITOT 0.6 12/24/2023 1815   BILITOT <0.2 05/03/2023 1639   GFRNONAA 32 (L) 12/24/2023 1815   GFRAA 100 09/15/2020 1356    Imaging: DG Chest 2 View CLINICAL DATA:  Lightheaded.  EXAM: CHEST - 2 VIEW  COMPARISON:  December 13, 2023  FINDINGS: The heart size and mediastinal contours are within normal limits. There is no evidence of an acute infiltrate, pleural effusion or pneumothorax. The visualized skeletal structures are  unremarkable.  IMPRESSION: No active cardiopulmonary disease.  Electronically Signed   By: Aram Candela M.D.   On: 12/24/2023 19:57   EKG: personally reviewed my interpretation is sinus rhythm, rate 96, normal axis and intervals. Unchanged from prior EKG.  ASSESSMENT & PLAN:   Assessment & Plan by Problem: Principal Problem:   AKI (acute kidney injury) (HCC)   Abdifatah Colquhoun is a 58 y.o. person living with a history of T2DM, HTN, HLD, COPD and EtOH use disorder who presented with dizziness and admitted for AKI on hospital day 0  # Dizziness # AKI  Scr 2.32 from last 0.97 baseline. BUN/Scr ratio suggestive of prerenal etiology. UA  with proteinuria noted from prior UA along with ketones. Reports fairly normal urine output at home. No recent NSAID use. UDS with benzo and THC. Exam with signs of hypovolemia. No focal neuro deficits on exam. Suspect orthostatic in setting of dehydration. CK 990. S/p 2L LR boluses in ED.  - encourage po fluid intake - trend renal function - strict I&O, monitor UOP - check orthostatics  - avoid nephrotoxic agents   # EtOH use # Hx of withdrawal seizure  # Hyponatremia  # Elevated LFT Recently admitted for this with noted seizure like activity. D/c with naltrexone but unclear if taking. He is interested in alcohol and tobacco cessation. Last drink was 3/23 PM. Mildly elevated AST/ALT, normal bili. Na stable 132 compared to last hospitalization, getting IVF.  - CIWA w/ ativan  - thiamine, MVI, folic acid  - trend liver enzymes  - monitor Na/BMP - discussion on resuming naltrexone at discharge - [facility] consult for substance use resources  Elevated troponin Troponin 36>35. Denies any chest pain. No acute ischemic changes on EKG compared to previous EKG. Monitor for now.   T2DM, controlled Last A1c 6.6. Holding home metformin and Invokana. Glucose 142.  - monitor glucose, if significantly elevated can add on SSI  HTN  Low normal BP on arrival.  Receiving IVF.  - hold enalapril in setting of AKI, hold amlodipine   COPD Tobacco use Stable on room air. Clear on lung exam.  - continue home Stiolto inhaler therapy (use hospital formulary) - nicotine patch daily  - smoking cessation counseling    Insomnia: amitriptyline 100 mg at bedtime  HLD: unclear if taking Lipitor 80 mg, holding for now  Diet: Normal VTE: Enoxaparin IVF: LR,Bolus Code: Full (discussed with patient and confirmed Full code status) Surrogate Decision Maker: sister Junious Dresser)  Prior to Admission Living Arrangement: Home, living brother and sister Anticipated Discharge Location: Home Barriers to Discharge: AKI  Dispo: Admit patient to Observation with expected length of stay less than 2 midnights.  Signed: Rana Snare, DO Internal Medicine Resident PGY-2 Pager: [phone] 12/24/2023, 10:42 PM   Please contact on-call pager, weekdays after 5pm and weekends after 1pm, at [phone].

## 2023-12-24 NOTE — ED Notes (Signed)
 Patient transported to X-ray

## 2023-12-25 ENCOUNTER — Other Ambulatory Visit (HOSPITAL_COMMUNITY): Payer: Self-pay

## 2023-12-25 ENCOUNTER — Encounter: Admitting: Student

## 2023-12-25 DIAGNOSIS — N179 Acute kidney failure, unspecified: Principal | ICD-10-CM

## 2023-12-25 LAB — COMPREHENSIVE METABOLIC PANEL
ALT: 61 U/L — ABNORMAL HIGH (ref 0–44)
AST: 70 U/L — ABNORMAL HIGH (ref 15–41)
Albumin: 3.4 g/dL — ABNORMAL LOW (ref 3.5–5.0)
Alkaline Phosphatase: 97 U/L (ref 38–126)
Anion gap: 9 (ref 5–15)
BUN: 26 mg/dL — ABNORMAL HIGH (ref 6–20)
CO2: 25 mmol/L (ref 22–32)
Calcium: 8.6 mg/dL — ABNORMAL LOW (ref 8.9–10.3)
Chloride: 98 mmol/L (ref 98–111)
Creatinine, Ser: 1.88 mg/dL — ABNORMAL HIGH (ref 0.61–1.24)
GFR, Estimated: 41 mL/min — ABNORMAL LOW (ref >60)
Glucose, Bld: 188 mg/dL — ABNORMAL HIGH (ref 70–99)
Potassium: 3.3 mmol/L — ABNORMAL LOW (ref 3.5–5.1)
Sodium: 132 mmol/L — ABNORMAL LOW (ref 135–145)
Total Bilirubin: 0.7 mg/dL (ref 0.0–1.2)
Total Protein: 6.4 g/dL — ABNORMAL LOW (ref 6.5–8.1)

## 2023-12-25 LAB — CBC
HCT: 38.4 % — ABNORMAL LOW (ref 39.0–52.0)
Hemoglobin: 12.8 g/dL — ABNORMAL LOW (ref 13.0–17.0)
MCH: 26.6 pg (ref 26.0–34.0)
MCHC: 33.3 g/dL (ref 30.0–36.0)
MCV: 79.7 fL — ABNORMAL LOW (ref 80.0–100.0)
Platelets: 313 10*3/uL (ref 150–400)
RBC: 4.82 MIL/uL (ref 4.22–5.81)
RDW: 14.6 % (ref 11.5–15.5)
WBC: 8.1 10*3/uL (ref 4.0–10.5)
nRBC: 0 % (ref 0.0–0.2)

## 2023-12-25 LAB — MAGNESIUM: Magnesium: 2 mg/dL (ref 1.7–2.4)

## 2023-12-25 LAB — GLUCOSE, CAPILLARY: Glucose-Capillary: 162 mg/dL — ABNORMAL HIGH (ref 70–99)

## 2023-12-25 MED ORDER — ADULT MULTIVITAMIN W/MINERALS CH
1.0000 | ORAL_TABLET | Freq: Every day | ORAL | Status: DC
  Administered 2023-12-25: 1 via ORAL
  Filled 2023-12-25: qty 1

## 2023-12-25 MED ORDER — NICOTINE 14 MG/24HR TD PT24
14.0000 mg | MEDICATED_PATCH | Freq: Every day | TRANSDERMAL | Status: DC
Start: 2023-12-25 — End: 2023-12-25
  Administered 2023-12-25: 14 mg via TRANSDERMAL
  Filled 2023-12-25: qty 1

## 2023-12-25 MED ORDER — POTASSIUM CHLORIDE CRYS ER 20 MEQ PO TBCR
40.0000 meq | EXTENDED_RELEASE_TABLET | Freq: Once | ORAL | Status: AC
  Administered 2023-12-25: 40 meq via ORAL
  Filled 2023-12-25: qty 2

## 2023-12-25 MED ORDER — FOLIC ACID 1 MG PO TABS
1.0000 mg | ORAL_TABLET | Freq: Every day | ORAL | Status: DC
Start: 2023-12-25 — End: 2023-12-25
  Administered 2023-12-25: 1 mg via ORAL
  Filled 2023-12-25: qty 1

## 2023-12-25 MED ORDER — TRAZODONE HCL 50 MG PO TABS
50.0000 mg | ORAL_TABLET | Freq: Every day | ORAL | 2 refills | Status: DC
  Filled 2023-12-25: qty 30, 30d supply, fill #0

## 2023-12-25 MED ORDER — AMITRIPTYLINE HCL 50 MG PO TABS: 100.0000 mg | ORAL_TABLET | Freq: Every day | ORAL | Status: DC

## 2023-12-25 NOTE — Progress Notes (Signed)
 DISCHARGE NOTE HOME Ruben Pierce to be discharged Home per MD order. Discussed prescriptions and follow up appointments with the patient. Prescriptions given to patient; medication list explained in detail. Patient verbalized understanding.  Skin clean, dry and intact without evidence of skin break down, no evidence of skin tears noted. IV catheter discontinued intact. Site without signs and symptoms of complications. Dressing and pressure applied. Pt denies pain at the site currently. No complaints noted.  Patient free of lines, drains, and wounds.   An After Visit Summary (AVS) was printed and given to the patient. Patient escorted via wheelchair, and discharged home via private auto.  [name], RN

## 2023-12-25 NOTE — TOC Progression Note (Signed)
 Transition of Care [facility]) - Progression Note    Patient Details  Name: Ruben Pierce MRN: [id number] Date of Birth: 08-Jul-1966  [facility] CM/SW Contact  Erin Sons, [place] Phone Number: 12/25/2023, 12:08 PM  Clinical Narrative:     Met with pt and provided alcohol tx resources and local AA meeting list. Tx Resource list marked with which facilities takes medicaid. Tobacco cessation info added to AVS.   Expected Discharge Plan: Home/Self Care Barriers to Discharge: No Barriers Identified  Expected Discharge Plan and Services         Expected Discharge Date: 12/25/23                                     Social Determinants of Health (SDOH) Interventions SDOH Screenings   Food Insecurity: No Food Insecurity (12/25/2023)  Housing: Low Risk  (12/25/2023)  Transportation Needs: No Transportation Needs (12/25/2023)  Utilities: Not At Risk (12/25/2023)  Alcohol Screen: Medium Risk (03/21/2018)  Depression (PHQ2-9): Low Risk  (11/30/2022)  Social Connections: Socially Isolated (12/25/2023)  Tobacco Use: High Risk (12/24/2023)    Readmission Risk Interventions     No data to display

## 2023-12-25 NOTE — Discharge Summary (Signed)
 Name: Ruben Pierce MRN: [id number] DOB: [birth year] 58 y.o. PCP: Ruben Morel, DO  Date of Admission: 12/24/2023  5:16 PM Date of Discharge:  12/25/2023 Attending Physician: Dr. Antony Contras  DISCHARGE DIAGNOSIS:  Primary Problem: AKI (acute kidney injury) [facility])   Hospital Problems: Principal Problem:   AKI (acute kidney injury) (HCC)    DISCHARGE MEDICATIONS:   Allergies as of 12/25/2023   No Known Allergies      Medication List     STOP taking these medications    amitriptyline 100 MG tablet Commonly known as: ELAVIL       TAKE these medications    acetaminophen 500 MG tablet Commonly known as: TYLENOL Take 2 tablets (1,000 mg total) by mouth every 8 (eight) hours as needed.   amLODipine 10 MG tablet Commonly known as: NORVASC Take 1 tablet (10 mg total) by mouth daily.   CertaVite/Antioxidants Tabs Take 1 tablet by mouth daily.   enalapril 10 MG tablet Commonly known as: VASOTEC Take 1 tablet (10 mg total) by mouth daily.   famotidine 20 MG tablet Commonly known as: PEPCID TAKE ONE TABLET BY MOUTH TWICE DAILY   folic acid 1 MG tablet Commonly known as: FOLVITE Take 1 tablet (1 mg total) by mouth daily.   Invokana 300 MG Tabs tablet Generic drug: canagliflozin TAKE ONE TABLET BY MOUTH EVERY DAY ** BEFORE breakfast **   metFORMIN 1000 MG tablet Commonly known as: GLUCOPHAGE Take 1 tablet (1,000 mg total) by mouth 2 (two) times daily with a meal.   mupirocin ointment 2 % Commonly known as: BACTROBAN APPLY TO THE AFFECTED AREA(S) EVERY DAY   naltrexone 50 MG tablet Commonly known as: DEPADE Take 1 tablet (50 mg total) by mouth daily.   pantoprazole 40 MG tablet Commonly known as: PROTONIX Take 1 tablet (40 mg total) by mouth daily.   Stiolto Respimat 2.5-2.5 MCG/ACT Aers Generic drug: Tiotropium Bromide-Olodaterol INHALE TWO puffs into THE lungs EVERY DAY   thiamine 100 MG tablet Commonly known as: VITAMIN B1 Take 1 tablet (100 mg  total) by mouth daily.   traZODone 50 MG tablet Commonly known as: DESYREL Take 1 tablet (50 mg total) by mouth at bedtime.   varenicline 1 MG tablet Commonly known as: Chantix Continuing Month Pak Take 1 tablet (1 mg total) by mouth 2 (two) times daily.   Varenicline Tartrate (Starter) 0.5 MG X 11 & 1 MG X 42 Tbpk Commonly known as: Chantix Starting Month Pak Take 0.5 mg by mouth daily for 3 days, THEN 0.5 mg in the morning and at bedtime for 4 days, THEN 1 mg in the morning and at bedtime for 21 days. Start taking on: December 19, 2023       DISPOSITION AND FOLLOW-UP:  [name] was discharged from [facility] in [place] condition. At the hospital follow up visit please address:  Acute kidney injury: Improving at discharge, prerenal etiology secondary to poor oral intake and significant alcohol use.  Encourage adequate hydration.  Recheck creatinine.  Alcohol use disorder: Continues to drink >15 beers per day despite recent hospitalization for alcohol withdrawal seizures.  Discharged on naltrexone.  Recommended he pursue outpatient alcohol rehabilitation.  Follow-up on this and continue to advise alcohol cessation.  Please see recent discharge summary for recommendations from prior hospital stay as well.  Follow-up Appointments:  Follow-up Information     Ruben Morel, DO. Go on 01/03/2024.   Specialty: Internal Medicine Why: For a hospital follow up visit at  1:15pm. Contact information: [address] [place] [address] [phone]                 HOSPITAL COURSE:  Patient Summary: Acute kidney injury Presented with a serum creatinine of 2.32 up from his baseline of 0.97.  BUN/creatinine ratio suggestive of prerenal etiology, likely secondary to poor oral intake with heavy alcohol use. Improved with IV fluids. Encouraged him to drink at least 1 liter of water per day. Good urine output, resolving at time of discharge.  Alcohol use  disorder History of alcohol withdrawal seizures Dizziness Patient has been drinking a significant amount of alcohol since his recent discharge from the hospital (>15 beers per day), but has not been drinking much water at all. CIWA has been low, no withdrawal symptoms. Last drink was on 3/23. Spoke with him extensively about the importance of alcohol cessation, and he vocalized his motivation to stop drinking and to go to alcohol rehab outpatient.  We will call his sister to reiterate this to her as well.  I suspect his dizziness was secondary to alcohol use and dehydration.  His symptoms and lab work have improved with IV fluid rehydration. We have switched him from amitriptyline to trazodone, as this may also be contributing to his dizziness.  He will be discharged with naltrexone to take daily to curb his alcohol cravings. He is medically stable for discharge with close outpatient follow-up and outpatient alcohol rehabilitation.   DISCHARGE INSTRUCTIONS:   Discharge Instructions     Call MD for:  difficulty breathing, headache or visual disturbances   Complete by: As directed    Call MD for:  extreme fatigue   Complete by: As directed    Call MD for:  persistant dizziness or light-headedness   Complete by: As directed    Call MD for:  persistant nausea and vomiting   Complete by: As directed    Call MD for:  severe uncontrolled pain   Complete by: As directed    Diet - low sodium heart healthy   Complete by: As directed    Discharge instructions   Complete by: As directed    You were hospitalized for an acute kidney injury due to dehydration.  Your kidney function has improved with IV fluids and you are medically stable for discharge.  Thank you for allowing [place] to be part of your care.   We arranged for you to follow up at: the internal medicine clinic on April 3rd at 1:15 pm. It is located on the ground floor of the hospital.   Please note these changes made to your medications:    Please START taking: Trazodone 50 mg nightly for sleep  Please STOP taking: Amitriptyline, as this may be contributing to your dizziness   It is very important that you quit drinking so much. We have given you Naltrexone to take once daily to help curb your cravings. However, given your repeated hospitalization for drinking, our recommendation is that you seek treatment at a rehab facility after discharge.   It is also very important for you to drink plenty of fluids, aiming for at least one liter daily to avoid kidney injury.   If you experience severe lightheadedness and dizziness, seizure-like activity, loss of consciousness, difficulty breathing, please return to the emergency department for further evaluation.  Please call our clinic if you have any questions or concerns, we may be able to help and keep you from a long and expensive emergency room wait.  Our clinic and after hours phone number is [phone], the best time to call is Monday through Friday 9 am to 4 pm but there is always someone available 24/7 if you have an emergency. If you need medication refills please notify your pharmacy one week in advance and they will send [place] a request.   Increase activity slowly   Complete by: As directed        SUBJECTIVE:   Doing well this morning, appears to be at his baseline.  No urinary concerns.  Urine output is good on IV fluids.   Discharge Vitals:   BP 122/84   Pulse 92   Temp 98.6 F (37 C)   Resp 20   Ht 6\' 1"  (1.854 m)   Wt 84.4 kg   SpO2 98%   BMI 24.54 kg/m   OBJECTIVE:  Physical Exam Constitutional:      Appearance: Normal appearance.  HENT:     Head: Normocephalic and atraumatic.     Nose: Nose normal.     Mouth/Throat:     Mouth: Mucous membranes are moist.     Pharynx: Oropharynx is clear.  Eyes:     Extraocular Movements: Extraocular movements intact.     Pupils: Pupils are equal, round, and reactive to light.  Cardiovascular:     Rate and Rhythm:  Normal rate and regular rhythm.     Pulses: Normal pulses.     Heart sounds: Normal heart sounds.  Pulmonary:     Effort: Pulmonary effort is normal.     Breath sounds: Normal breath sounds.  Abdominal:     General: Abdomen is flat.     Palpations: Abdomen is soft.  Musculoskeletal:        General: Normal range of motion.     Cervical back: Normal range of motion.  Skin:    General: Skin is warm and dry.  Neurological:     General: No focal deficit present.     Mental Status: He is alert and oriented to person, place, and time.  Psychiatric:        Mood and Affect: Mood normal.        Behavior: Behavior normal.     Pertinent Labs, Studies, and Procedures:     Latest Ref Rng & Units 12/25/2023    3:34 AM 12/24/2023    6:15 PM 12/16/2023    6:25 AM  CBC  WBC 4.0 - 10.5 K/uL 8.1  10.7  8.7   Hemoglobin 13.0 - 17.0 g/dL 82.9  56.2  13.0   Hematocrit 39.0 - 52.0 % 38.4  39.6  43.7   Platelets 150 - 400 K/uL 313  374  249        Latest Ref Rng & Units 12/25/2023    3:34 AM 12/24/2023    6:15 PM 12/18/2023    5:01 AM  CMP  Glucose 70 - 99 mg/dL 865  784  696   BUN 6 - 20 mg/dL 26  30  19    Creatinine 0.61 - 1.24 mg/dL 2.95  2.84  1.32   Sodium 135 - 145 mmol/L 132  132  131   Potassium 3.5 - 5.1 mmol/L 3.3  3.8  3.9   Chloride 98 - 111 mmol/L 98  93  95   CO2 22 - 32 mmol/L 25  24  23    Calcium 8.9 - 10.3 mg/dL 8.6  8.9  9.2   Total Protein 6.5 - 8.1 g/dL 6.4  6.5  6.3  Total Bilirubin 0.0 - 1.2 mg/dL 0.7  0.6  0.3   Alkaline Phos 38 - 126 U/L 97  104  82   AST 15 - 41 U/L 70  78  37   ALT 0 - 44 U/L 61  60  77    DG Chest 2 View Result Date: 12/24/2023 CLINICAL DATA:  Lightheaded. EXAM: CHEST - 2 VIEW COMPARISON:  December 13, 2023 FINDINGS: The heart size and mediastinal contours are within normal limits. There is no evidence of an acute infiltrate, pleural effusion or pneumothorax. The visualized skeletal structures are unremarkable. IMPRESSION: No active cardiopulmonary  disease. Electronically Signed   By: Aram Candela M.D.   On: 12/24/2023 19:57     Signed: Annett Fabian, MD Internal Medicine Resident, PGY-1 [facility]  Pager: [phone] 11:43 AM, 12/25/2023

## 2023-12-25 NOTE — TOC Transition Note (Signed)
 Transition of Care [facility]) - Discharge Note   Patient Details  Name: Ruben Pierce MRN: [id number] Date of Birth: 04-Sep-1966  [facility]) CM/SW Contact:  Tom-Johnson, Hershal Coria, RN Phone Number: 12/25/2023, 12:48 PM   Clinical Narrative:     Patient is scheduled for discharge today.  Readmission Risk Assessment done. New patient establishment, Outpatient f/u, hospital f/u and discharge instructions on AVS. Prescriptions sent to [facility] pharmacy and patient will receive meds prior discharge. Cab voucher requested by patient, will be given at the discharge lounge. No TOC recommendations noted. No further TOC needs noted.       Final next level of care: Home/Self Care Barriers to Discharge: Barriers Resolved   Patient Goals and CMS Choice Patient states their goals for this hospitalization and ongoing recovery are:: To return home CMS Medicare.gov Compare Post Acute Care list provided to:: Patient Choice offered to / list presented to : NA      Discharge Placement                Patient to be transferred to facility by: [facility]      Discharge Plan and Services Additional resources added to the After Visit Summary for                  DME Arranged: N/A DME Agency: NA       HH Arranged: NA HH Agency: NA        Social Drivers of Health (SDOH) Interventions SDOH Screenings   Food Insecurity: No Food Insecurity (12/25/2023)  Housing: Low Risk  (12/25/2023)  Transportation Needs: No Transportation Needs (12/25/2023)  Utilities: Not At Risk (12/25/2023)  Alcohol Screen: Medium Risk (03/21/2018)  Depression (PHQ2-9): Low Risk  (11/30/2022)  Social Connections: Socially Isolated (12/25/2023)  Tobacco Use: High Risk (12/24/2023)     Readmission Risk Interventions     No data to display

## 2023-12-25 NOTE — Hospital Course (Addendum)
 Acute kidney injury Presented with a serum creatinine of 2.32 up from his baseline of 0.97.  BUN/creatinine ratio suggestive of prerenal etiology, likely secondary to poor oral intake with heavy alcohol use. Improved with IV fluids. Encouraged him to drink at least 1 liter of water per day. Good urine output, resolving at time of discharge.  Alcohol use disorder History of alcohol withdrawal seizures Dizziness Patient has been drinking a significant amount of alcohol since his recent discharge from the hospital (>15 beers per day), but has not been drinking much water at all. CIWA has been low, no withdrawal symptoms. Last drink was on 3/23. Spoke with him extensively about the importance of alcohol cessation, and he vocalized his motivation to stop drinking and to go to alcohol rehab outpatient.  We will call his sister to reiterate this to her as well.  I suspect his dizziness was secondary to alcohol use and dehydration.  His symptoms and lab work have improved with IV fluid rehydration. We have switched him from amitriptyline to trazodone, as this may also be contributing to his dizziness.  He will be discharged with naltrexone to take daily to curb his alcohol cravings. He is medically stable for discharge with close outpatient follow-up and outpatient alcohol rehabilitation.

## 2023-12-26 ENCOUNTER — Telehealth: Payer: Self-pay | Admitting: Student

## 2023-12-26 ENCOUNTER — Encounter: Admitting: Occupational Therapy

## 2023-12-26 NOTE — Telephone Encounter (Signed)
 Copied from CRM [id number]. Topic: Referral - Request for Referral >> Dec 25, 2023  3:46 PM Maxwell Marion wrote: Patient's sister Junious Dresser called, said patient is needing another referral sent to [facility] on [address] in [place], [place] for occupational therapy. Says they contacted the patient to schedule him for apt but he's unable to make it and they need another referral to get him scheduled again. Her callback number is [phone].  Please see Prev Message as an appointment has been schedule to address a new Referral request.

## 2023-12-26 NOTE — Telephone Encounter (Signed)
 Copied from CRM [id number]. Topic: Referral - Request for Referral >> Dec 25, 2023  3:46 PM Maxwell Marion wrote: Patient's sister Junious Dresser called, said patient is needing another referral sent to [facility] on [address] in [place], [place] for occupational therapy. Says they contacted the patient to schedule him for apt but he's unable to make it and they need another referral to get him scheduled again. Her callback number is [phone].  LVM new Referral request to be addressed at his upcoming appointment sch below:  Name: Ruben Pierce, Ruben Pierce MRN: [id number]  Date: 01/03/2024 Status: Sch  Time: 1:15 PM Length: 30  Visit Type: OPEN ESTABLISHED [726] Copay: $0.00  Provider: Lovie Macadamia, MD

## 2024-01-02 ENCOUNTER — Other Ambulatory Visit: Payer: Self-pay | Admitting: Student

## 2024-01-02 DIAGNOSIS — M79604 Pain in right leg: Secondary | ICD-10-CM

## 2024-01-02 NOTE — Telephone Encounter (Signed)
 Medication discontinued 12/15/23

## 2024-01-03 ENCOUNTER — Encounter: Admitting: Student

## 2024-01-12 DIAGNOSIS — Z419 Encounter for procedure for purposes other than remedying health state, unspecified: Secondary | ICD-10-CM | POA: Diagnosis not present

## 2024-01-16 ENCOUNTER — Ambulatory Visit: Payer: Self-pay

## 2024-01-16 NOTE — Telephone Encounter (Signed)
 Copied from CRM [id number]. Topic: Clinical - Red Word Triage >> Jan 16, 2024  1:06 PM Prudencio Pair wrote: Red Word that prompted transfer to Nurse Triage: Patient's sister, Junious Dresser, calling in wanting to see if her brother can be "squeezed in" tomorrow. She states his knees are swollen and he can't hardly walk. He has been like this for a month now and he's having to use a cane. She states he's in so much pain. Junious Dresser states she has an appt tomorrow and wants to see if he can come when she comes.   Chief Complaint: Knee swelling  Symptoms: Right knee swelling  Frequency: Constant  Pertinent Negatives: Patient denies redness to knee Disposition: [] ED /[] Urgent Care (no appt availability in office) / [x] Appointment(In office/virtual)/ []  [facility]/ [] Home Care/ [] Refused Recommended Disposition /[] [facility]/ []  Follow-up with PCP Additional Notes: Patient's sister called trying to get the patient an appointment tomorrow for knee swelling. She states that his right knee has been swollen and painful for approximately 1 month. She states that the swelling and pain has been causing him some difficulty walking and that he has had to use a cane for support. She states that there is no noticeable redness to the knee. Appointment made for the patient tomorrow for evaluation of his symptoms.      Reason for Disposition  MILD or MODERATE swelling (e.g., can't move joint normally, can't do usual activities) (Exceptions: Itchy, localized swelling; swelling is chronic.)  Answer Assessment - Initial Assessment Questions 1. LOCATION: "Where is the swelling located?"  (e.g., left, right, both knees)     Right knee 2. ONSET: "When did the swelling start?" "Does it come and go, or is it there all the time?"     1 month  3. SWELLING: "How bad is the swelling?" Or, "How large is it?" (e.g., mild, moderate, severe; size of localized swelling)    - NONE: No joint swelling.   - LOCALIZED:  Localized; small area of puffy or swollen skin (e.g., insect bite, skin irritation).   - MILD: Joint looks or feels mildly swollen or puffy.   - MODERATE: Swollen; interferes with normal activities (e.g., work or school); can't move joint normally (bend and straighten completely); may be limping.   - SEVERE: Very swollen; can't move swollen joint at all; limping a lot or unable to walk.     Moderate   4. PAIN: "Is there any pain?" If Yes, ask: "How bad is it?" (Scale 1-10; or mild, moderate, severe)   - NONE (0): no pain.   - MILD (1-3): doesn't interfere with normal activities.    - MODERATE (4-7): interferes with normal activities (e.g., work or school) or awakens from sleep, limping.    - SEVERE (8-10): excruciating pain, unable to do any normal activities, unable to walk.      Moderate  5. SETTING: "Has there been any recent work, exercise or other activity that involved that part of the body?"      No 6. AGGRAVATING FACTORS: "What makes the knee swelling worse?" (e.g., walking, climbing stairs, running)     No 7. ASSOCIATED SYMPTOMS: "Is there any pain or redness?"     No 8. OTHER SYMPTOMS: "Do you have any other symptoms?" (e.g., chest pain, difficulty breathing, fever, calf pain)     No  Protocols used: Knee Swelling-A-AH

## 2024-01-16 NOTE — Telephone Encounter (Signed)
 Pt has an appt tomorrow 4/17 with Dr Jari Merles.

## 2024-01-17 ENCOUNTER — Other Ambulatory Visit: Payer: Self-pay

## 2024-01-17 ENCOUNTER — Encounter: Payer: Self-pay | Admitting: Student

## 2024-01-17 ENCOUNTER — Ambulatory Visit: Admitting: Student

## 2024-01-17 ENCOUNTER — Ambulatory Visit (HOSPITAL_COMMUNITY)
Admission: RE | Admit: 2024-01-17 | Discharge: 2024-01-17 | Disposition: A | Discharge status: Home / Self Care | Source: Ambulatory Visit | Attending: Internal Medicine | Admitting: Internal Medicine

## 2024-01-17 VITALS — BP 123/70 | HR 107 | Temp 98.1°F | Ht 73.0 in | Wt 183.3 lb

## 2024-01-17 DIAGNOSIS — N179 Acute kidney failure, unspecified: Secondary | ICD-10-CM

## 2024-01-17 DIAGNOSIS — E1159 Type 2 diabetes mellitus with other circulatory complications: Secondary | ICD-10-CM

## 2024-01-17 DIAGNOSIS — M25561 Pain in right knee: Secondary | ICD-10-CM | POA: Insufficient documentation

## 2024-01-17 DIAGNOSIS — M179 Osteoarthritis of knee, unspecified: Secondary | ICD-10-CM | POA: Insufficient documentation

## 2024-01-17 DIAGNOSIS — M1711 Unilateral primary osteoarthritis, right knee: Secondary | ICD-10-CM | POA: Diagnosis not present

## 2024-01-17 DIAGNOSIS — F1090 Alcohol use, unspecified, uncomplicated: Secondary | ICD-10-CM

## 2024-01-17 MED ORDER — DICLOFENAC SODIUM 1 % EX GEL: 4.0000 g | Freq: Four times a day (QID) | CUTANEOUS | 2 refills | Status: DC

## 2024-01-17 NOTE — Assessment & Plan Note (Deleted)
 BP controlled today 123/70. Reported on amlodipine 10 mg and enalapril 10 mg daily. No acute concerns regarding this.   Plan -continue amlodipine and enalapril

## 2024-01-17 NOTE — Assessment & Plan Note (Addendum)
 Recently hospitalized 3/24-35 for AKI 2/2 dehydration in setting of alcohol use disorder. Received IVF and Scr improved. Appears to have normal renal function before.   Plan -Repeat CMP  -Encourage adequate hydration, continue alcohol counseling

## 2024-01-17 NOTE — Assessment & Plan Note (Signed)
 States doing better. No alcohol recently. Was hospitalized on 3/24-25 for AKI and alcohol use. Not taking naltrexone now.   Plan -Continue alcohol counseling  -Repeat CMP today

## 2024-01-17 NOTE — Progress Notes (Signed)
 CC: right knee pain x 1 month  HPI:  [name] is a 58 y.o. male living with a history stated below and presents today for right knee pain for 1 month. Please see problem based assessment and plan for additional details.  Past Medical History:  Diagnosis Date   Asthma    Counseling regarding advanced directives and goals of care 12/01/2020   DNR/DNI MOST form filled out 12/01/20   ETOH abuse    Hypertension    TBI (traumatic brain injury) (HCC)    Type 2 diabetes mellitus, uncontrolled 2010    Current Outpatient Medications on File Prior to Visit  Medication Sig Dispense Refill   acetaminophen (TYLENOL) 500 MG tablet Take 2 tablets (1,000 mg total) by mouth every 8 (eight) hours as needed. 180 tablet 2   amLODipine (NORVASC) 10 MG tablet Take 1 tablet (10 mg total) by mouth daily. 30 tablet 11   enalapril (VASOTEC) 10 MG tablet Take 1 tablet (10 mg total) by mouth daily. 90 tablet 3   famotidine (PEPCID) 20 MG tablet TAKE ONE TABLET BY MOUTH TWICE DAILY 180 tablet 3   folic acid (FOLVITE) 1 MG tablet Take 1 tablet (1 mg total) by mouth daily. 90 tablet 0   INVOKANA 300 MG TABS tablet TAKE ONE TABLET BY MOUTH EVERY DAY ** BEFORE breakfast ** 90 tablet 1   metFORMIN (GLUCOPHAGE) 1000 MG tablet Take 1 tablet (1,000 mg total) by mouth 2 (two) times daily with a meal. 360 tablet 2   Multiple Vitamin (MULTIVITAMIN WITH MINERALS) TABS tablet Take 1 tablet by mouth daily. 30 tablet 3   mupirocin ointment (BACTROBAN) 2 % APPLY TO THE AFFECTED AREA(S) EVERY DAY 22 g 2   naltrexone (DEPADE) 50 MG tablet Take 1 tablet (50 mg total) by mouth daily. 90 tablet 3   pantoprazole (PROTONIX) 40 MG tablet Take 1 tablet (40 mg total) by mouth daily. 30 tablet 11   STIOLTO RESPIMAT 2.5-2.5 MCG/ACT AERS INHALE TWO puffs into THE lungs EVERY DAY 4 g 1   thiamine (VITAMIN B1) 100 MG tablet Take 1 tablet (100 mg total) by mouth daily. 30 tablet 3   traZODone (DESYREL) 50 MG tablet Take 1 tablet (50 mg  total) by mouth at bedtime. 30 tablet 2   varenicline (CHANTIX CONTINUING MONTH PAK) 1 MG tablet Take 1 tablet (1 mg total) by mouth 2 (two) times daily. 60 tablet 1   No current facility-administered medications on file prior to visit.    Family History  Problem Relation Age of Onset   CAD Mother    Diabetes Mellitus II Father    Hypertension Father    Hyperlipidemia Father    Colon cancer Neg Hx    Esophageal cancer Neg Hx    Rectal cancer Neg Hx    Stomach cancer Neg Hx     Social History   Socioeconomic History   Marital status: Single    Spouse name: Not on file   Number of children: Not on file   Years of education: 9th grade   Highest education level: Not on file  Occupational History   Occupation: diabled  Tobacco Use   Smoking status: Every Day    Current packs/day: 0.25    Types: Cigarettes   Smokeless tobacco: Never   Tobacco comments:    2 cigs per day    Started smoking at 15  Vaping Use   Vaping status: Never Used  Substance and Sexual Activity  Alcohol use: Yes    Alcohol/week: 12.0 standard drinks of alcohol    Types: 12 Cans of beer per week   Drug use: Yes    Types: Marijuana    Comment: Rarely.   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  Now is cared for by his Sister.   Social Drivers of Corporate investment banker Strain: Not on file  Food Insecurity: No Food Insecurity (12/25/2023)   Hunger Vital Sign    Worried About Running Out of Food in the Last Year: Never true    Ran Out of Food in the Last Year: Never true  Transportation Needs: No Transportation Needs (12/25/2023)   PRAPARE - Administrator, Civil Service (Medical): No    Lack of Transportation (Non-Medical): No  Physical Activity: Not on file  Stress: Not on file  Social Connections: Socially Isolated  (12/25/2023)   Social Connection and Isolation Panel [NHANES]    Frequency of Communication with Friends and Family: More than three times a week    Frequency of Social Gatherings with Friends and Family: More than three times a week    Attends Religious Services: Never    Database administrator or Organizations: No    Attends Banker Meetings: Never    Marital Status: Never married  Intimate Partner Violence: Not At Risk (12/25/2023)   Humiliation, Afraid, Rape, and Kick questionnaire    Fear of Current or Ex-Partner: No    Emotionally Abused: No    Physically Abused: No    Sexually Abused: No    Review of Systems: ROS negative except for what is noted on the assessment and plan.  Vitals:   01/17/24 0936  BP: 123/70  Pulse: (!) 107  Temp: 98.1 F (36.7 C)  TempSrc: Oral  SpO2: 100%  Weight: 183 lb 4.8 oz (83.1 kg)  Height: 6\' 1"  (1.854 m)   Physical Exam: Constitutional: alert, sitting in chair, in no acute distress Cardiovascular: regular rate and rhythm Pulmonary/Chest: normal work of breathing on room air MSK: R knee swelling with TTP, no crepitus, ROM limited with full ext/flex, no effusion, no warmth or surrounding erythema, ambulates with cane Neurological: alert & oriented x 3  Assessment & Plan:   Acute pain of right knee Reports right knee pain for at least 1 month. Denies fall or injury. Denies redness or heat. States pain before but more now with ambulation. Has not tried anything for pain or swelling. Exam without erythema or heat, able to do ROM, noted swelling and tenderness without crepitus. Left knee unremarkable. Less gout/crystal given no heat/redness. Otherwise vitals stable and afebrile. Question OA, will obtain imaging and start symptom mgmt.   Plan -Voltaren gel QID PRN -Tylenol PRN  -Right knee X-ray -F/u in 1-2 months, consider PT referral, return precautions given  AKI (acute kidney injury) (HCC) Recently hospitalized 3/24-35 for  AKI 2/2 dehydration in setting of alcohol use disorder. Received IVF and Scr improved. Appears to have normal renal function before.   Plan -Repeat CMP  -Encourage adequate hydration, continue alcohol counseling  Alcohol use disorder States doing better. No alcohol recently. Was hospitalized on 3/24-25 for AKI and alcohol use. Not taking naltrexone now.   Plan -Continue alcohol counseling  -Repeat CMP today  Patient discussed with Dr.  Alda Hummer, D.O. [facility], PGY-2 Phone: [phone] Date 01/17/2024 Time 3:59 PM

## 2024-01-17 NOTE — Assessment & Plan Note (Addendum)
 Reports right knee pain for at least 1 month. Denies fall or injury. Denies redness or heat. States pain before but more now with ambulation. Has not tried anything for pain or swelling. Exam without erythema or heat, able to do ROM, noted swelling and tenderness without crepitus. Left knee unremarkable. Less gout/crystal given no heat/redness. Otherwise vitals stable and afebrile. Question OA, will obtain imaging and start symptom mgmt.   Plan -Voltaren gel QID PRN -Tylenol PRN  -Right knee X-ray -F/u in 1-2 months, consider PT referral, return precautions given

## 2024-01-17 NOTE — Patient Instructions (Addendum)
 Thank you, [name] for allowing us  to provide your care today. Today we discussed   -Right knee pain: ---Voltaren gel over your right knee up to 4 times a day ---Xray of your right knee today  ---Tylenol [phone] mg every 8 hours as needed  -Blood work today to check kidneys and liver   I have ordered the following medication/changed the following medications:  Start the following medications: Meds ordered this encounter  Medications   diclofenac Sodium (VOLTAREN ARTHRITIS PAIN) 1 % GEL    Sig: Apply 4 g topically 4 (four) times daily.    Dispense:  150 g    Refill:  2     Follow up:  1-2 months    Should you have any questions or concerns please call the internal medicine clinic at [phone].    Kayron Hicklin, D.O. [facility]

## 2024-01-18 LAB — CMP14 + ANION GAP
ALT: 19 IU/L (ref 0–44)
AST: 15 IU/L (ref 0–40)
Albumin: 4.6 g/dL (ref 3.8–4.9)
Alkaline Phosphatase: 122 IU/L — ABNORMAL HIGH (ref 44–121)
Anion Gap: 20 mmol/L — ABNORMAL HIGH (ref 10.0–18.0)
BUN/Creatinine Ratio: 19 (ref 9–20)
BUN: 19 mg/dL (ref 6–24)
Bilirubin Total: <0.2 mg/dL (ref 0.0–1.2)
CO2: 19 mmol/L — ABNORMAL LOW (ref 20–29)
Calcium: 9.7 mg/dL (ref 8.7–10.2)
Chloride: 96 mmol/L (ref 96–106)
Creatinine, Ser: 0.99 mg/dL (ref 0.76–1.27)
Globulin, Total: 2.4 g/dL (ref 1.5–4.5)
Glucose: 103 mg/dL — ABNORMAL HIGH (ref 70–99)
Potassium: 5 mmol/L (ref 3.5–5.2)
Sodium: 135 mmol/L (ref 134–144)
Total Protein: 7 g/dL (ref 6.0–8.5)
eGFR: 89 mL/min/{1.73_m2} (ref >59)

## 2024-01-18 NOTE — Progress Notes (Signed)
 Internal Medicine Clinic Attending  Case discussed with the resident at the time of the visit.  We reviewed the resident's history and exam and pertinent patient test results.  I agree with the assessment, diagnosis, and plan of care documented in the resident's note.

## 2024-01-25 ENCOUNTER — Other Ambulatory Visit: Payer: Self-pay | Admitting: Student

## 2024-01-25 DIAGNOSIS — E119 Type 2 diabetes mellitus without complications: Secondary | ICD-10-CM

## 2024-01-25 NOTE — Addendum Note (Signed)
 Addended by: Jearldine Mina on: 01/25/2024 11:44 AM   Modules accepted: Orders

## 2024-01-25 NOTE — Addendum Note (Signed)
 Addended by: Manfred Seed on: 01/25/2024 11:42 AM   Modules accepted: Orders

## 2024-01-25 NOTE — Addendum Note (Signed)
 Addended by: Manfred Seed on: 01/25/2024 11:41 AM   Modules accepted: Orders

## 2024-01-28 ENCOUNTER — Telehealth: Payer: Self-pay | Admitting: Student

## 2024-01-28 ENCOUNTER — Other Ambulatory Visit: Payer: Self-pay | Admitting: Student

## 2024-01-28 NOTE — Telephone Encounter (Signed)
 Copied from CRM [id number]. Topic: Clinical - Medication Refill >> Jan 28, 2024  3:33 PM Tisa Forester wrote: Most Recent Primary Care Visit:  Provider: Jearldine Mina  Department: [facility]  Visit Type: ACUTE  Date: 01/17/2024  Medication: certa vite  Has the patient contacted their pharmacy? Yes (Agent: If no, request that the patient contact the pharmacy for the refill. If patient does not wish to contact the pharmacy document the reason why and proceed with request.) (Agent: If yes, when and what did the pharmacy advise?)  Is this the correct pharmacy for this prescription? Yes If no, delete pharmacy and type the correct one.  This is the patient's preferred pharmacy:  [facility] - [place], [place] - [address] [place] [address] Phone: [phone] Fax: [phone]  Has the prescription been filled recently? No  Is the patient out of the medication? Yes  Has the patient been seen for an appointment in the last year OR does the patient have an upcoming appointment? Yes  Can we respond through MyChart? No  Agent: Please be advised that Rx refills may take up to 3 business days. We ask that you follow-up with your pharmacy.

## 2024-01-28 NOTE — Telephone Encounter (Signed)
 Last Fill: 12/20/23  Last OV: 01/17/24 Next OV: 02/21/24  Routing to provider for review/authorization.

## 2024-01-28 NOTE — Telephone Encounter (Unsigned)
 Copied from CRM [id number]. Topic: Clinical - Medication Refill >> Jan 28, 2024  3:33 PM Tisa Forester wrote: Most Recent Primary Care Visit:  Provider: Jearldine Mina  Department[facility]  Visit Type: ACUTE  Date: 01/17/2024  Medication: certa vite  Has the patient contacted their pharmacy? Yes (Agent: If no, request that the patient contact the pharmacy for the refill. If patient does not wish to contact the pharmacy document the reason why and proceed with request.) (Agent: If yes, when and what did the pharmacy advise?)  Is this the correct pharmacy for this prescription? Yes If no, delete pharmacy and type the correct one.  This is the patient's preferred pharmacy:  [facility] - [place], [place] - [address] [place] [address] Phone: [phone] Fax: [phone]  Has the prescription been filled recently? No  Is the patient out of the medication? Yes  Has the patient been seen for an appointment in the last year OR does the patient have an upcoming appointment? Yes  Can we respond through MyChart? No  Agent: Please be advised that Rx refills may take up to 3 business days. We ask that you follow-up with your pharmacy. >> Jan 28, 2024  3:38 PM Tisa Forester wrote: Patient need his acchek needles as well

## 2024-01-28 NOTE — Telephone Encounter (Signed)
 Copied from CRM [id number]. Topic: Clinical - Medication Refill >> Jan 28, 2024  3:23 PM Tisa Forester wrote: Most Recent Primary Care Visit:  Provider: Jearldine Mina  Department[facility]  Visit Type: ACUTE  Date: 01/17/2024  Medication: thiamine  (VITAMIN B1) 100 MG tablet, traZODone  (DESYREL ) 50 MG tablet      Has the patient contacted their pharmacy? No (Agent: If no, request that the patient contact the pharmacy for the refill. If patient does not wish to contact the pharmacy document the reason why and proceed with request.) (Agent: If yes, when and what did the pharmacy advise?)  Is this the correct pharmacy for this prescription? Yes If no, delete pharmacy and type the correct one.  This is the patient's preferred pharmacy:  [facility] - [place], [place] - [address] [place] [address] Phone: [phone] Fax: [phone]  Has the prescription been filled recently? No  Is the patient out of the medication? Yes  Has the patient been seen for an appointment in the last year OR does the patient have an upcoming appointment? Yes  Can we respond through MyChart? No  Agent: Please be advised that Rx refills may take up to 3 business days. We ask that you follow-up with your pharmacy.

## 2024-01-29 ENCOUNTER — Other Ambulatory Visit (HOSPITAL_COMMUNITY): Payer: Self-pay

## 2024-01-29 MED ORDER — ADULT MULTIVITAMIN W/MINERALS CH: 1.0000 | ORAL_TABLET | Freq: Every day | ORAL | 3 refills | Status: DC

## 2024-01-29 MED ORDER — MULTIPLE VITAMIN PO TABS: 1.0000 | ORAL_TABLET | Freq: Every day | ORAL | 11 refills | Status: AC

## 2024-01-29 NOTE — Addendum Note (Signed)
 Addended by: Cathey Clunes on: 01/29/2024 01:17 PM   Modules accepted: Orders

## 2024-01-29 NOTE — Addendum Note (Signed)
 Addended by: Dravon Nott on: 01/29/2024 11:31 AM   Modules accepted: Orders

## 2024-01-31 ENCOUNTER — Other Ambulatory Visit

## 2024-01-31 DIAGNOSIS — E119 Type 2 diabetes mellitus without complications: Secondary | ICD-10-CM

## 2024-01-31 LAB — URINALYSIS, ROUTINE W REFLEX MICROSCOPIC
Bacteria, UA: NONE SEEN
Bilirubin Urine: NEGATIVE
Glucose, UA: >=500 mg/dL — AB
Hgb urine dipstick: NEGATIVE
Ketones, ur: 5 mg/dL — AB
Leukocytes,Ua: NEGATIVE
Nitrite: NEGATIVE
Protein, ur: NEGATIVE mg/dL
Specific Gravity, Urine: 1.027 (ref 1.005–1.030)
pH: 6 (ref 5.0–8.0)

## 2024-01-31 LAB — BASIC METABOLIC PANEL WITH GFR
Anion gap: 13 (ref 5–15)
BUN: 13 mg/dL (ref 6–20)
CO2: 25 mmol/L (ref 22–32)
Calcium: 10 mg/dL (ref 8.9–10.3)
Chloride: 93 mmol/L — ABNORMAL LOW (ref 98–111)
Creatinine, Ser: 1.03 mg/dL (ref 0.61–1.24)
GFR, Estimated: >60 mL/min (ref >60)
Glucose, Bld: 100 mg/dL — ABNORMAL HIGH (ref 70–99)
Potassium: 4.4 mmol/L (ref 3.5–5.1)
Sodium: 131 mmol/L — ABNORMAL LOW (ref 135–145)

## 2024-01-31 LAB — LACTIC ACID, PLASMA: Lactic Acid, Venous: 1.7 mmol/L (ref 0.5–1.9)

## 2024-02-01 ENCOUNTER — Other Ambulatory Visit: Payer: Self-pay | Admitting: Student

## 2024-02-01 DIAGNOSIS — F1090 Alcohol use, unspecified, uncomplicated: Secondary | ICD-10-CM

## 2024-02-01 MED ORDER — THIAMINE HCL 100 MG PO TABS: 100.0000 mg | ORAL_TABLET | Freq: Every day | ORAL | 3 refills | Status: AC

## 2024-02-01 MED ORDER — TRAZODONE HCL 50 MG PO TABS: 50.0000 mg | ORAL_TABLET | Freq: Every day | ORAL | 2 refills | Status: DC

## 2024-02-11 DIAGNOSIS — Z419 Encounter for procedure for purposes other than remedying health state, unspecified: Secondary | ICD-10-CM | POA: Diagnosis not present

## 2024-02-12 ENCOUNTER — Other Ambulatory Visit: Payer: Self-pay | Admitting: Podiatry

## 2024-02-12 ENCOUNTER — Other Ambulatory Visit: Payer: Self-pay | Admitting: Student

## 2024-02-12 DIAGNOSIS — J449 Chronic obstructive pulmonary disease, unspecified: Secondary | ICD-10-CM

## 2024-02-21 ENCOUNTER — Encounter: Payer: Self-pay | Admitting: Internal Medicine

## 2024-02-21 ENCOUNTER — Ambulatory Visit (INDEPENDENT_AMBULATORY_CARE_PROVIDER_SITE_OTHER): Admitting: Internal Medicine

## 2024-02-21 VITALS — BP 127/90 | HR 101 | Temp 98.7°F | Ht 73.0 in | Wt 171.4 lb

## 2024-02-21 DIAGNOSIS — N179 Acute kidney failure, unspecified: Secondary | ICD-10-CM | POA: Diagnosis not present

## 2024-02-21 DIAGNOSIS — M1711 Unilateral primary osteoarthritis, right knee: Secondary | ICD-10-CM | POA: Diagnosis not present

## 2024-02-21 DIAGNOSIS — Z7984 Long term (current) use of oral hypoglycemic drugs: Secondary | ICD-10-CM

## 2024-02-21 DIAGNOSIS — F17200 Nicotine dependence, unspecified, uncomplicated: Secondary | ICD-10-CM

## 2024-02-21 DIAGNOSIS — Z1211 Encounter for screening for malignant neoplasm of colon: Secondary | ICD-10-CM

## 2024-02-21 DIAGNOSIS — R634 Abnormal weight loss: Secondary | ICD-10-CM | POA: Diagnosis not present

## 2024-02-21 DIAGNOSIS — E611 Iron deficiency: Secondary | ICD-10-CM | POA: Diagnosis not present

## 2024-02-21 DIAGNOSIS — E1169 Type 2 diabetes mellitus with other specified complication: Secondary | ICD-10-CM | POA: Diagnosis not present

## 2024-02-21 DIAGNOSIS — E785 Hyperlipidemia, unspecified: Secondary | ICD-10-CM

## 2024-02-21 DIAGNOSIS — E1159 Type 2 diabetes mellitus with other circulatory complications: Secondary | ICD-10-CM | POA: Diagnosis not present

## 2024-02-21 DIAGNOSIS — G4700 Insomnia, unspecified: Secondary | ICD-10-CM

## 2024-02-21 DIAGNOSIS — M1990 Unspecified osteoarthritis, unspecified site: Secondary | ICD-10-CM

## 2024-02-21 DIAGNOSIS — E119 Type 2 diabetes mellitus without complications: Secondary | ICD-10-CM | POA: Diagnosis not present

## 2024-02-21 DIAGNOSIS — I152 Hypertension secondary to endocrine disorders: Secondary | ICD-10-CM | POA: Diagnosis not present

## 2024-02-21 DIAGNOSIS — Z0000 Encounter for general adult medical examination without abnormal findings: Secondary | ICD-10-CM

## 2024-02-21 MED ORDER — AMITRIPTYLINE HCL 25 MG PO TABS
25.0000 mg | ORAL_TABLET | Freq: Every day | ORAL | 0 refills | Status: AC
Start: 2024-02-21 — End: 2024-05-21

## 2024-02-21 NOTE — Progress Notes (Signed)
 CC: 1 month follow up for knee pain  HPI:  [name] is a 58 y.o. with medical history of HTN, T2DM, COPD, and OA presenting to [facility] for one month follow up for his knee pain.   Please see problem-based list for further details, assessments, and plans.  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  Current Outpatient Medications (Cardiovascular):    atorvastatin  (LIPITOR) 80 MG tablet, Take 1 tablet (80 mg total) by mouth daily.   amLODipine  (NORVASC ) 10 MG tablet, Take 1 tablet (10 mg total) by mouth daily.   enalapril  (VASOTEC ) 20 MG tablet, Take 1 tablet (20 mg total) by mouth daily.  Current Outpatient Medications (Respiratory):    STIOLTO RESPIMAT  2.5-2.5 MCG/ACT AERS, INHALE TWO PUFFS INTO THE LUNGS EVERY DAY  Current Outpatient Medications (Analgesics):    acetaminophen  (TYLENOL ) 500 MG tablet, Take 2 tablets (1,000 mg total) by mouth every 8 (eight) hours as needed.  Current Outpatient Medications (Hematological):    folic acid  (FOLVITE ) 1 MG tablet, Take 1 tablet (1 mg total) by mouth daily.  Current Outpatient Medications (Other):    amitriptyline  (ELAVIL ) 25 MG tablet, Take 1 tablet (25 mg total) by mouth at bedtime.   diclofenac  Sodium (VOLTAREN ) 1 % GEL, Apply 4 g topically 4 (four) times daily.   famotidine  (PEPCID ) 20 MG tablet, TAKE ONE TABLET BY MOUTH TWICE DAILY    Multiple Vitamin tablet, Take 1 tablet by mouth daily.   mupirocin  ointment (BACTROBAN ) 2 %, APPLY TO THE AFFECTED AREA(S) EVERY DAY   naltrexone  (DEPADE) 50 MG tablet, Take 1 tablet (50 mg total) by mouth daily.   pantoprazole  (PROTONIX ) 40 MG tablet, Take 1 tablet (40 mg total) by mouth daily.   thiamine  (VITAMIN B1) 100 MG tablet, Take 1 tablet (100 mg total) by mouth daily.   varenicline  (CHANTIX ) 1 MG tablet, Take 1 tablet (1 mg total) by mouth 2 (two) times daily.  Review of Systems:  Review of system negative unless stated in the problem list or HPI.    Physical Exam:  Vitals:   02/21/24 1319 02/21/24 1416  BP: (!) 142/95 (!) 127/90  Pulse: (!) 103 (!) 101  Temp: 98.7 F (37.1 C)   TempSrc: Oral   SpO2: 98%   Weight: 171 lb 6.4 oz (77.7 kg)   Height: 6\' 1"  (1.854 m)    Physical Exam General: NAD HENT: NCAT Lungs: CTAB, no wheeze, rhonchi or rales.  Cardiovascular: Normal heart sounds, no r/m/g, 2+ pulses in all extremities. No LE edema Abdomen: No TTP, normal bowel sounds MSK: right knee with TTP of joint line. Skin: no lesions noted on exposed skin Neuro: Alert and oriented x4. CN grossly intact Psych: Normal mood and normal affect   Assessment & Plan:   AKI (acute kidney injury) (HCC) Resolved on last blood  work.   Osteoarthritis of knee Plain radiography of the right knee showed osteoarthritis.  Patient has not tried conservative measures.  He states he will not for Voltaren  gel and has not been trying Tylenol .  Advised they will help with his pain.  Discussed physical therapy with the patient and he is interested in getting this.  This has been shown to be superior to corticosteroid injection when compared for osteoarthritis of the knee.  Hypertension associated with diabetes (HCC) Patient blood pressure was initially elevated at 142/95 but repeat showed improvement at 127/90.  Patient is on amlodipine  10 mg and enalapril  10 mg.  Given his diabetes we can  increase his enalapril  to 20 mg daily.  Will have him come back in 1 month for repeat BMP as we are changing the dose of his ACE inhibitor.  Type 2 diabetes mellitus (HCC) A1c 6.6 last visit. This visit is 6.6. On invokana  300 mg every day, and metformin  1000 mg BID.  Will continue current regimen.  Health care maintenance For exam on this visit ([name] on this visit Referral placed this visit  Hyperlipidemia associated with type 2 diabetes mellitus [facility]) Patient with hyperlipidemia who is currently on Lipitor 80 mg.  Last LDL was checked 1 year ago.  Will check it at next blood draw.  Will advise him to continue his Lipitor at current dose.  Insomnia Patient reports he is having difficulty sleeping ever since his Elavil  has been discontinued.  He was started on trazodone  at [facility] discharge because he was having some dizziness and was admitted for acute encephalopathy.  I believe since this has been a chronic medication for the patient for greater than 15 years we can safely restart this at a lower dose of 25 mg nightly.  I advised him that if this does not provide relief he can take 50 mg but no more than that.   See Encounters Tab for problem based charting.  Patient Discussed with Dr. Versa Gore, MD Tommas Fragmin. [facility], PGY-3

## 2024-02-21 NOTE — Assessment & Plan Note (Signed)
 Resolved on last blood work.

## 2024-02-21 NOTE — Patient Instructions (Addendum)
 Ruben [facility], it was a pleasure seeing you today! You endorsed feeling well today. Below are some of the things we talked about this visit. We look forward to seeing you in the follow up appointment!  Today we discussed: Continue taking your medications.   For your knee pain  I am going to refer you to physical therapy. Take tylenol  up to 3000 mg daily and use voltaren  gel.   I am restarting your elavil  back. Take one to two tablets at night. Do not take more than that.    I have ordered the following labs today:  Lab Orders         CBC with Diff         Iron, TIBC and Ferritin Panel         BMP8+Anion Gap         Hemoglobin A1c         Microalbumin / Creatinine Urine Ratio         TSH       Referrals ordered today:   Referral Orders         Referral to Nutrition and Diabetes Services         Ambulatory referral to Physical Therapy      I have ordered the following medication/changed the following medications:   Stop the following medications: Medications Discontinued During This Encounter  Medication Reason   traZODone  (DESYREL ) 50 MG tablet Discontinued by provider     Start the following medications: Meds ordered this encounter  Medications   amitriptyline  (ELAVIL ) 25 MG tablet    Sig: Take 1 tablet (25 mg total) by mouth at bedtime.    Dispense:  90 tablet    Refill:  0     Follow-up: 1 month follow up   Please make sure to arrive 15 minutes prior to your next appointment. If you arrive late, you may be asked to reschedule.   We look forward to seeing you next time. Please call our clinic at [phone] if you have any questions or concerns. The best time to call is Monday-Friday from 9am-4pm, but there is someone available 24/7. If after hours or the weekend, call the main hospital number and ask for the Internal Medicine Resident On-Call. If you need medication refills, please notify your pharmacy one week in advance and they will send us  a request.  Thank  you for letting us  take part in your care. Wishing you the best!  Thank you, Jackolyn Masker, MD

## 2024-02-22 LAB — CBC WITH DIFFERENTIAL/PLATELET
Basophils Absolute: 0 10*3/uL (ref 0.0–0.2)
Basos: 0 %
EOS (ABSOLUTE): 0.1 10*3/uL (ref 0.0–0.4)
Eos: 1 %
Hematocrit: 44.7 % (ref 37.5–51.0)
Hemoglobin: 15 g/dL (ref 13.0–17.7)
Immature Grans (Abs): 0 10*3/uL (ref 0.0–0.1)
Immature Granulocytes: 0 %
Lymphocytes Absolute: 3.3 10*3/uL — ABNORMAL HIGH (ref 0.7–3.1)
Lymphs: 36 %
MCH: 27.2 pg (ref 26.6–33.0)
MCHC: 33.6 g/dL (ref 31.5–35.7)
MCV: 81 fL (ref 79–97)
Monocytes Absolute: 0.8 10*3/uL (ref 0.1–0.9)
Monocytes: 9 %
Neutrophils Absolute: 4.8 10*3/uL (ref 1.4–7.0)
Neutrophils: 54 %
Platelets: 352 10*3/uL (ref 150–450)
RBC: 5.51 x10E6/uL (ref 4.14–5.80)
RDW: 15 % (ref 11.6–15.4)
WBC: 9 10*3/uL (ref 3.4–10.8)

## 2024-02-22 LAB — MICROALBUMIN / CREATININE URINE RATIO
Creatinine, Urine: 47.8 mg/dL
Microalb/Creat Ratio: 16 mg/g{creat} (ref 0–29)
Microalbumin, Urine: 7.6 ug/mL

## 2024-02-22 LAB — BMP8+ANION GAP
Anion Gap: 21 mmol/L — ABNORMAL HIGH (ref 10.0–18.0)
BUN/Creatinine Ratio: 15 (ref 9–20)
BUN: 18 mg/dL (ref 6–24)
CO2: 20 mmol/L (ref 20–29)
Calcium: 10.1 mg/dL (ref 8.7–10.2)
Chloride: 92 mmol/L — ABNORMAL LOW (ref 96–106)
Creatinine, Ser: 1.18 mg/dL (ref 0.76–1.27)
Glucose: 76 mg/dL (ref 70–99)
Potassium: 4.8 mmol/L (ref 3.5–5.2)
Sodium: 133 mmol/L — ABNORMAL LOW (ref 134–144)
eGFR: 72 mL/min/{1.73_m2} (ref >59)

## 2024-02-22 LAB — IRON,TIBC AND FERRITIN PANEL
Ferritin: 49 ng/mL (ref 30–400)
Iron Saturation: 31 % (ref 15–55)
Iron: 128 ug/dL (ref 38–169)
Total Iron Binding Capacity: 412 ug/dL (ref 250–450)
UIBC: 284 ug/dL (ref 111–343)

## 2024-02-22 LAB — HEMOGLOBIN A1C
Est. average glucose Bld gHb Est-mCnc: 143 mg/dL
Hgb A1c MFr Bld: 6.6 % — ABNORMAL HIGH (ref 4.8–5.6)

## 2024-02-22 MED ORDER — ATORVASTATIN CALCIUM 80 MG PO TABS: 80.0000 mg | ORAL_TABLET | Freq: Every day | ORAL | Status: DC

## 2024-02-22 NOTE — Assessment & Plan Note (Signed)
 For exam on this visit ([name] on this visit Referral placed this visit

## 2024-02-22 NOTE — Assessment & Plan Note (Signed)
 A1c 6.6 last visit. This visit is 6.6. On invokana  300 mg every day, and metformin  1000 mg BID.  Will continue current regimen.

## 2024-02-22 NOTE — Assessment & Plan Note (Signed)
 Patient with hyperlipidemia who is currently on Lipitor 80 mg.  Last LDL was checked 1 year ago.  Will check it at next blood draw.  Will advise him to continue his Lipitor at current dose.

## 2024-02-22 NOTE — Assessment & Plan Note (Signed)
 Plain radiography of the right knee showed osteoarthritis.  Patient has not tried conservative measures.  He states he will not for Voltaren  gel and has not been trying Tylenol .  Advised they will help with his pain.  Discussed physical therapy with the patient and he is interested in getting this.  This has been shown to be superior to corticosteroid injection when compared for osteoarthritis of the knee.

## 2024-02-22 NOTE — Assessment & Plan Note (Addendum)
 Patient blood pressure was initially elevated at 142/95 but repeat showed improvement at 127/90.  Patient is on amlodipine  10 mg and enalapril  10 mg.  Given his diabetes we can increase his enalapril  to 20 mg daily.  Will have him come back in 1 month for repeat BMP as we are changing the dose of his ACE inhibitor.

## 2024-02-23 DIAGNOSIS — R634 Abnormal weight loss: Secondary | ICD-10-CM | POA: Insufficient documentation

## 2024-02-23 MED ORDER — ENALAPRIL MALEATE 20 MG PO TABS: 20.0000 mg | ORAL_TABLET | Freq: Every day | ORAL | 3 refills | Status: AC

## 2024-02-23 NOTE — Assessment & Plan Note (Addendum)
 Noted on chart review.  Patient with 12 pound weight loss since visit 1 month ago.  When inquired patient states he does have a good appetite but does not know what to eat.  He tries to avoid foods that go worsen his diabetes.  He repeats multiple times "I do not know what to eat".  Differential diagnosis is broad including hyperthyroidism given he is tachycardic versus poor nutritional intake versus malignancy.  Will get a TSH to look for thyroid dysfunction.  Will also refer him to nutritionist as he is requesting help to determine the best foods for him.  He is denying lack of access to food.  Weight loss can be a sign of malignancy but patient does have a good appetite.  Patient did have a colonoscopy in 2021 but the quality was poor and he was recommended to get it repeated.  It appears he did not have a repeat done.  Will refer him to GI to get this done.  Patient with significant tobacco use history who would qualify for lung cancer screening.  Will order low-dose CT scan for him as well.  Apart from these, I do not believe he needs further cancer screenings.  Will continue to trend his weight at subsequent visits.

## 2024-02-23 NOTE — Assessment & Plan Note (Signed)
 Patient reports he is having difficulty sleeping ever since his Elavil  has been discontinued.  He was started on trazodone  at hospital discharge because he was having some dizziness and was admitted for acute encephalopathy.  I believe since this has been a chronic medication for the patient for greater than 15 years we can safely restart this at a lower dose of 25 mg nightly.  I advised him that if this does not provide relief he can take 50 mg but no more than that.

## 2024-02-26 ENCOUNTER — Other Ambulatory Visit: Payer: Self-pay | Admitting: Student

## 2024-02-26 LAB — TSH: TSH: 0.661 u[IU]/mL (ref 0.450–4.500)

## 2024-02-26 LAB — SPECIMEN STATUS REPORT

## 2024-02-26 NOTE — Progress Notes (Signed)
 Internal Medicine Clinic Attending  Case discussed with the resident at the time of the visit.  We reviewed the resident's history and exam and pertinent patient test results.  I agree with the assessment, diagnosis, and plan of care documented in the resident's note.

## 2024-02-26 NOTE — Telephone Encounter (Signed)
 Medication discontinued 12/12/23

## 2024-02-27 ENCOUNTER — Ambulatory Visit: Payer: Self-pay | Admitting: Internal Medicine

## 2024-03-12 ENCOUNTER — Other Ambulatory Visit: Payer: Self-pay | Admitting: Student

## 2024-03-12 DIAGNOSIS — K219 Gastro-esophageal reflux disease without esophagitis: Secondary | ICD-10-CM

## 2024-03-12 DIAGNOSIS — E119 Type 2 diabetes mellitus without complications: Secondary | ICD-10-CM

## 2024-03-12 DIAGNOSIS — I152 Hypertension secondary to endocrine disorders: Secondary | ICD-10-CM

## 2024-03-12 DIAGNOSIS — G4700 Insomnia, unspecified: Secondary | ICD-10-CM

## 2024-03-13 DIAGNOSIS — Z419 Encounter for procedure for purposes other than remedying health state, unspecified: Secondary | ICD-10-CM | POA: Diagnosis not present

## 2024-03-15 ENCOUNTER — Other Ambulatory Visit: Payer: Self-pay | Admitting: Student

## 2024-03-15 DIAGNOSIS — E119 Type 2 diabetes mellitus without complications: Secondary | ICD-10-CM

## 2024-03-17 ENCOUNTER — Ambulatory Visit: Payer: Self-pay | Admitting: Student

## 2024-03-17 NOTE — Telephone Encounter (Signed)
 Medication sent to pharmacy

## 2024-03-17 NOTE — Progress Notes (Deleted)
 01/17/2024  HTN-amlodipine  10, enalapril  20 T2DM-canagliflozin  300, metformin  1000 twice daily, A1c 6.6 on 02/21/2024, urine microalbumin normal ***needs eye exam*** COPD-Stiolto    CC: {Clinic Visit Type:31040}  HPI:  Ruben Pierce is a 58 y.o. male with pertinent PMH of HTN, T2DM, COPD, ED, and prior TBI who presents as above. Please see assessment and plan below for further details.  Review of Systems:   Pertinent items noted in HPI and/or A&P.  Physical Exam:  There were no vitals filed for this visit.  Constitutional:***. In no acute distress. HEENT: Normocephalic, atraumatic, Sclera non-icteric, PERRL, EOM intact Cardio:Regular rate and rhythm. 2+ bilateral {PulseLoc:28294} pulses. Pulm:Clear to auscultation bilaterally. Normal work of breathing on room air. Abdomen: Soft, non-tender, non-distended, positive bowel sounds. UJW:JXBJYNWG for extremity edema. Skin:Warm and dry. Neuro:Alert and oriented x3. No focal deficit noted. Psych:Pleasant mood and affect.   Assessment & Plan:   No problem-specific Assessment & Plan notes found for this encounter.    Patient {GC/GE:3044014::discussed with,seen with} Dr. {NFAOZ:[id number]::IONGEXBMW,UXL,KGMWNUU,VOZDGU,YQIHKVQ,QVZDGLOV,}  Cleven Dallas, DO [facility] Internal [facility] Resident PGY-2 Clinic Phone: [phone] Pager: [phone]

## 2024-03-18 ENCOUNTER — Encounter: Payer: Self-pay | Admitting: Student

## 2024-03-21 ENCOUNTER — Emergency Department (HOSPITAL_COMMUNITY)

## 2024-03-21 ENCOUNTER — Encounter (HOSPITAL_COMMUNITY): Payer: Self-pay | Admitting: Emergency Medicine

## 2024-03-21 ENCOUNTER — Other Ambulatory Visit: Payer: Self-pay

## 2024-03-21 ENCOUNTER — Emergency Department (HOSPITAL_COMMUNITY)
Admission: EM | Admit: 2024-03-21 | Discharge: 2024-03-21 | Disposition: A | Discharge status: Home / Self Care | Attending: Emergency Medicine | Admitting: Emergency Medicine

## 2024-03-21 DIAGNOSIS — S0502XA Injury of conjunctiva and corneal abrasion without foreign body, left eye, initial encounter: Secondary | ICD-10-CM | POA: Diagnosis present

## 2024-03-21 DIAGNOSIS — Y92410 Unspecified street and highway as the place of occurrence of the external cause: Secondary | ICD-10-CM | POA: Insufficient documentation

## 2024-03-21 DIAGNOSIS — Z7951 Long term (current) use of inhaled steroids: Secondary | ICD-10-CM | POA: Insufficient documentation

## 2024-03-21 DIAGNOSIS — S0990XA Unspecified injury of head, initial encounter: Secondary | ICD-10-CM | POA: Diagnosis not present

## 2024-03-21 DIAGNOSIS — E119 Type 2 diabetes mellitus without complications: Secondary | ICD-10-CM | POA: Diagnosis not present

## 2024-03-21 DIAGNOSIS — S0240FA Zygomatic fracture, left side, initial encounter for closed fracture: Secondary | ICD-10-CM | POA: Diagnosis not present

## 2024-03-21 DIAGNOSIS — I10 Essential (primary) hypertension: Secondary | ICD-10-CM | POA: Diagnosis not present

## 2024-03-21 DIAGNOSIS — S0240DA Maxillary fracture, left side, initial encounter for closed fracture: Secondary | ICD-10-CM

## 2024-03-21 DIAGNOSIS — S0232XA Fracture of orbital floor, left side, initial encounter for closed fracture: Secondary | ICD-10-CM

## 2024-03-21 DIAGNOSIS — S0282XA Fracture of other specified skull and facial bones, left side, initial encounter for closed fracture: Secondary | ICD-10-CM

## 2024-03-21 DIAGNOSIS — S0285XA Fracture of orbit, unspecified, initial encounter for closed fracture: Secondary | ICD-10-CM | POA: Diagnosis not present

## 2024-03-21 DIAGNOSIS — G510 Bell's palsy: Secondary | ICD-10-CM | POA: Diagnosis not present

## 2024-03-21 DIAGNOSIS — S02842A Fracture of lateral orbital wall, left side, initial encounter for closed fracture: Secondary | ICD-10-CM

## 2024-03-21 DIAGNOSIS — Z7984 Long term (current) use of oral hypoglycemic drugs: Secondary | ICD-10-CM | POA: Insufficient documentation

## 2024-03-21 DIAGNOSIS — R000 Tachycardia, unspecified: Secondary | ICD-10-CM | POA: Diagnosis not present

## 2024-03-21 DIAGNOSIS — J45909 Unspecified asthma, uncomplicated: Secondary | ICD-10-CM | POA: Diagnosis not present

## 2024-03-21 DIAGNOSIS — Z79899 Other long term (current) drug therapy: Secondary | ICD-10-CM | POA: Diagnosis not present

## 2024-03-21 DIAGNOSIS — R4781 Slurred speech: Secondary | ICD-10-CM | POA: Diagnosis not present

## 2024-03-21 DIAGNOSIS — R519 Headache, unspecified: Secondary | ICD-10-CM | POA: Diagnosis not present

## 2024-03-21 MED ORDER — METOCLOPRAMIDE HCL 10 MG PO TABS
10.0000 mg | ORAL_TABLET | Freq: Once | ORAL | Status: AC
  Administered 2024-03-21: 10 mg via ORAL
  Filled 2024-03-21: qty 1

## 2024-03-21 MED ORDER — IBUPROFEN 400 MG PO TABS
600.0000 mg | ORAL_TABLET | Freq: Once | ORAL | Status: AC
  Administered 2024-03-21: 600 mg via ORAL
  Filled 2024-03-21: qty 1

## 2024-03-21 MED ORDER — VALACYCLOVIR HCL 1 G PO TABS: 1000.0000 mg | ORAL_TABLET | Freq: Three times a day (TID) | ORAL | 0 refills | Status: AC

## 2024-03-21 MED ORDER — VALACYCLOVIR HCL 500 MG PO TABS
1000.0000 mg | ORAL_TABLET | Freq: Once | ORAL | Status: AC
  Administered 2024-03-21: 1000 mg via ORAL
  Filled 2024-03-21: qty 2

## 2024-03-21 MED ORDER — ARTIFICIAL TEARS OPHTHALMIC OINT: TOPICAL_OINTMENT | Freq: Every day | OPHTHALMIC | 1 refills | Status: AC

## 2024-03-21 MED ORDER — PREDNISONE 20 MG PO TABS: 60.0000 mg | ORAL_TABLET | Freq: Every day | ORAL | 0 refills | Status: AC

## 2024-03-21 MED ORDER — ARTIFICIAL TEARS OP SOLN: 1.0000 [drp] | OPHTHALMIC | 12 refills | Status: AC

## 2024-03-21 MED ORDER — PREDNISONE 20 MG PO TABS
60.0000 mg | ORAL_TABLET | Freq: Once | ORAL | Status: AC
  Administered 2024-03-21: 60 mg via ORAL
  Filled 2024-03-21: qty 3

## 2024-03-21 MED ORDER — DIPHENHYDRAMINE HCL 25 MG PO CAPS
25.0000 mg | ORAL_CAPSULE | Freq: Once | ORAL | Status: AC
  Administered 2024-03-21: 25 mg via ORAL
  Filled 2024-03-21: qty 1
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
## 2024-03-21 NOTE — ED Triage Notes (Signed)
 Patient c/o right side facial droop, blurred vision, slurred speech and headache that started 1.5 days ago.  Patient states he did not seek emergency care when this started because he didn't have a ride.  Patient gives verbal consent for MSE.
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
## 2024-04-08 ENCOUNTER — Other Ambulatory Visit: Payer: Self-pay | Admitting: Student

## 2024-04-08 ENCOUNTER — Encounter: Admitting: Dietician

## 2024-04-08 DIAGNOSIS — G4700 Insomnia, unspecified: Secondary | ICD-10-CM

## 2024-04-08 NOTE — Telephone Encounter (Signed)
 Medication discontinued 02/21/24

## 2024-04-10 MED ORDER — TRAZODONE HCL 50 MG PO TABS: 25.0000 mg | ORAL_TABLET | Freq: Every day | ORAL | 1 refills | Status: DC

## 2024-04-10 NOTE — Addendum Note (Signed)
 Addended by: Vasili Fok on: 04/10/2024 05:44 AM   Modules accepted: Orders

## 2024-04-12 DIAGNOSIS — Z419 Encounter for procedure for purposes other than remedying health state, unspecified: Secondary | ICD-10-CM | POA: Diagnosis not present

## 2024-05-07 ENCOUNTER — Other Ambulatory Visit: Payer: Self-pay | Admitting: Podiatry

## 2024-05-07 ENCOUNTER — Other Ambulatory Visit: Payer: Self-pay | Admitting: Student

## 2024-05-07 ENCOUNTER — Ambulatory Visit: Payer: Self-pay | Admitting: Student

## 2024-05-07 DIAGNOSIS — J449 Chronic obstructive pulmonary disease, unspecified: Secondary | ICD-10-CM

## 2024-05-12 ENCOUNTER — Other Ambulatory Visit: Payer: Self-pay | Admitting: Student

## 2024-05-12 DIAGNOSIS — E1159 Type 2 diabetes mellitus with other circulatory complications: Secondary | ICD-10-CM

## 2024-05-12 NOTE — Telephone Encounter (Signed)
 Medication sent to pharmacy

## 2024-05-13 DIAGNOSIS — Z419 Encounter for procedure for purposes other than remedying health state, unspecified: Secondary | ICD-10-CM | POA: Diagnosis not present

## 2024-05-27 ENCOUNTER — Encounter: Payer: Self-pay | Admitting: Student

## 2024-05-28 ENCOUNTER — Emergency Department (HOSPITAL_COMMUNITY)
Admission: EM | Admit: 2024-05-28 | Discharge: 2024-05-29 | Disposition: A | Discharge status: Home / Self Care | Attending: Emergency Medicine | Admitting: Emergency Medicine

## 2024-05-28 ENCOUNTER — Encounter (HOSPITAL_COMMUNITY): Payer: Self-pay | Admitting: Emergency Medicine

## 2024-05-28 ENCOUNTER — Other Ambulatory Visit: Payer: Self-pay

## 2024-05-28 ENCOUNTER — Emergency Department (HOSPITAL_COMMUNITY)

## 2024-05-28 DIAGNOSIS — Z7984 Long term (current) use of oral hypoglycemic drugs: Secondary | ICD-10-CM | POA: Diagnosis not present

## 2024-05-28 DIAGNOSIS — Y9355 Activity, bike riding: Secondary | ICD-10-CM | POA: Diagnosis not present

## 2024-05-28 DIAGNOSIS — S8991XA Unspecified injury of right lower leg, initial encounter: Secondary | ICD-10-CM | POA: Diagnosis present

## 2024-05-28 DIAGNOSIS — I959 Hypotension, unspecified: Secondary | ICD-10-CM | POA: Insufficient documentation

## 2024-05-28 DIAGNOSIS — M25461 Effusion, right knee: Secondary | ICD-10-CM | POA: Diagnosis not present

## 2024-05-28 DIAGNOSIS — S81811A Laceration without foreign body, right lower leg, initial encounter: Secondary | ICD-10-CM | POA: Insufficient documentation

## 2024-05-28 DIAGNOSIS — E162 Hypoglycemia, unspecified: Secondary | ICD-10-CM | POA: Diagnosis not present

## 2024-05-28 DIAGNOSIS — Z041 Encounter for examination and observation following transport accident: Secondary | ICD-10-CM | POA: Diagnosis not present

## 2024-05-28 DIAGNOSIS — Z743 Need for continuous supervision: Secondary | ICD-10-CM | POA: Diagnosis not present

## 2024-05-28 DIAGNOSIS — W19XXXA Unspecified fall, initial encounter: Secondary | ICD-10-CM | POA: Diagnosis not present

## 2024-05-28 DIAGNOSIS — Z23 Encounter for immunization: Secondary | ICD-10-CM | POA: Diagnosis not present

## 2024-05-28 DIAGNOSIS — M1711 Unilateral primary osteoarthritis, right knee: Secondary | ICD-10-CM | POA: Diagnosis not present

## 2024-05-28 DIAGNOSIS — R6889 Other general symptoms and signs: Secondary | ICD-10-CM | POA: Diagnosis not present

## 2024-05-28 LAB — I-STAT CHEM 8, ED
BUN: 20 mg/dL (ref 6–20)
Calcium, Ion: 1.15 mmol/L (ref 1.15–1.40)
Chloride: 100 mmol/L (ref 98–111)
Creatinine, Ser: 1.5 mg/dL — ABNORMAL HIGH (ref 0.61–1.24)
Glucose, Bld: 82 mg/dL (ref 70–99)
HCT: 39 % (ref 39.0–52.0)
Hemoglobin: 13.3 g/dL (ref 13.0–17.0)
Potassium: 3.8 mmol/L (ref 3.5–5.1)
Sodium: 132 mmol/L — ABNORMAL LOW (ref 135–145)
TCO2: 17 mmol/L — ABNORMAL LOW (ref 22–32)

## 2024-05-28 LAB — CBC WITH DIFFERENTIAL/PLATELET
Abs Immature Granulocytes: 0.04 K/uL (ref 0.00–0.07)
Basophils Absolute: 0.1 K/uL (ref 0.0–0.1)
Basophils Relative: 1 %
Eosinophils Absolute: 0 K/uL (ref 0.0–0.5)
Eosinophils Relative: 1 %
HCT: 37.1 % — ABNORMAL LOW (ref 39.0–52.0)
Hemoglobin: 12.2 g/dL — ABNORMAL LOW (ref 13.0–17.0)
Immature Granulocytes: 1 %
Lymphocytes Relative: 38 %
Lymphs Abs: 3.2 K/uL (ref 0.7–4.0)
MCH: 26.7 pg (ref 26.0–34.0)
MCHC: 32.9 g/dL (ref 30.0–36.0)
MCV: 81.2 fL (ref 80.0–100.0)
Monocytes Absolute: 0.8 K/uL (ref 0.1–1.0)
Monocytes Relative: 10 %
Neutro Abs: 4.3 K/uL (ref 1.7–7.7)
Neutrophils Relative %: 49 %
Platelets: 253 K/uL (ref 150–400)
RBC: 4.57 MIL/uL (ref 4.22–5.81)
RDW: 15.6 % — ABNORMAL HIGH (ref 11.5–15.5)
WBC: 8.5 K/uL (ref 4.0–10.5)
nRBC: 0 % (ref 0.0–0.2)

## 2024-05-28 LAB — COMPREHENSIVE METABOLIC PANEL WITH GFR
ALT: 16 U/L (ref 0–44)
AST: 26 U/L (ref 15–41)
Albumin: 4 g/dL (ref 3.5–5.0)
Alkaline Phosphatase: 117 U/L (ref 38–126)
Anion gap: 16 — ABNORMAL HIGH (ref 5–15)
BUN: 19 mg/dL (ref 6–20)
CO2: 16 mmol/L — ABNORMAL LOW (ref 22–32)
Calcium: 9.1 mg/dL (ref 8.9–10.3)
Chloride: 99 mmol/L (ref 98–111)
Creatinine, Ser: 1.3 mg/dL — ABNORMAL HIGH (ref 0.61–1.24)
GFR, Estimated: >60 mL/min (ref >60)
Glucose, Bld: 82 mg/dL (ref 70–99)
Potassium: 3.8 mmol/L (ref 3.5–5.1)
Sodium: 131 mmol/L — ABNORMAL LOW (ref 135–145)
Total Bilirubin: 0.5 mg/dL (ref 0.0–1.2)
Total Protein: 7 g/dL (ref 6.5–8.1)

## 2024-05-28 LAB — CBG MONITORING, ED: Glucose-Capillary: 85 mg/dL (ref 70–99)

## 2024-05-28 MED ORDER — TETANUS-DIPHTH-ACELL PERTUSSIS 5-2.5-18.5 LF-MCG/0.5 IM SUSY
0.5000 mL | PREFILLED_SYRINGE | Freq: Once | INTRAMUSCULAR | Status: AC
  Administered 2024-05-29: 0.5 mL via INTRAMUSCULAR
  Filled 2024-05-28: qty 0.5

## 2024-05-28 MED ORDER — DOXYCYCLINE HYCLATE 100 MG PO CAPS: 100.0000 mg | ORAL_CAPSULE | Freq: Two times a day (BID) | ORAL | 0 refills | Status: DC

## 2024-05-28 MED ORDER — LACTATED RINGERS IV BOLUS
1000.0000 mL | Freq: Once | INTRAVENOUS | Status: AC
  Administered 2024-05-28: 1000 mL via INTRAVENOUS

## 2024-05-28 MED ORDER — LIDOCAINE-EPINEPHRINE (PF) 2 %-1:200000 IJ SOLN
10.0000 mL | Freq: Once | INTRAMUSCULAR | Status: AC
  Administered 2024-05-29: 10 mL
  Filled 2024-05-28: qty 20

## 2024-05-28 NOTE — ED Provider Notes (Signed)
 LACERATION REPAIR Performed by: Lonni JULIANNA Lites Authorized by: Lonni JULIANNA Lites Consent: Verbal consent obtained. Risks and benefits: risks, benefits and alternatives were discussed Consent given by: patient Patient identity confirmed: provided demographic data Prepped and Draped in normal sterile fashion Wound explored  Laceration Location: right lower extremity   Laceration Length: 13cm  No Foreign Bodies seen or palpated  Anesthesia: local infiltration  Local anesthetic: lidocaine  2% with epinephrine   Anesthetic total: 5 ml  Irrigation method: syringe Amount of cleaning: standard  Skin closure: close  Number of sutures: 13  Technique: simple interrupted  Patient tolerance: Patient tolerated the procedure well with no immediate complications.   After by attending Dr. Patsey to repair patient laceration.  Patient laceration repaired as above.  Will be sent home with Keflex .  Advised to follow-up with PCP, urgent care, ED.  Given supplies to care for wound at home.  Return precautions provided.   Lites Lonni JULIANNA, PA-C 05/29/24 SAL[place] Patsey Lot, MD 05/30/24 1640

## 2024-05-28 NOTE — ED Notes (Signed)
 Obtain cbg, vitals and changed into gown, fall risk bracelet and call bell within reach
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
## 2024-05-28 NOTE — Discharge Instructions (Addendum)
 The stitches can come out in about 10 days.
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
## 2024-05-29 MED ORDER — CEPHALEXIN 500 MG PO CAPS: 500.0000 mg | ORAL_CAPSULE | Freq: Four times a day (QID) | ORAL | 0 refills | Status: DC

## 2024-05-29 MED ORDER — CEPHALEXIN 250 MG PO CAPS
500.0000 mg | ORAL_CAPSULE | Freq: Once | ORAL | Status: AC
  Administered 2024-05-29: 500 mg via ORAL
  Filled 2024-05-29: qty 2

## 2024-05-29 NOTE — ED Notes (Signed)
 Lac cart at bedside prepped for MD

## 2024-06-04 ENCOUNTER — Other Ambulatory Visit: Payer: Self-pay | Admitting: Student

## 2024-06-04 DIAGNOSIS — G4700 Insomnia, unspecified: Secondary | ICD-10-CM

## 2024-06-10 ENCOUNTER — Ambulatory Visit: Payer: Self-pay

## 2024-06-10 ENCOUNTER — Inpatient Hospital Stay
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
## 2024-06-10 NOTE — Telephone Encounter (Signed)
 I talked to pt's sister,Connie- stated they over slept this am, missed his appt. She wants to re-schedule - call transferred to front office. Appt schedule 9/11 @ 0900 AM.
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
## 2024-06-10 NOTE — Patient Instructions (Incomplete)
 Thank you, Mr.Hazael Reidel for allowing us  to provide your care today. Today we discussed your previous hospital visit.    Referrals: -  New medications: -  I have ordered the following labs for you:  Lab Orders  No laboratory test(s) ordered today     I will call if any are abnormal. All of your labs can be accessed through My Chart.   My Chart Access: [email or web address]  Please follow-up in:    We look forward to seeing you next time. Please call our clinic at [phone] if you have any questions or concerns. The best time to call is Monday-Friday from 9am-4pm, but there is someone available 24/7. If after hours or the weekend, call the main hospital number and ask for the Internal Medicine Resident On-Call. If you need medication refills, please notify your pharmacy one week in advance and they will send us  a request.   Thank you for letting us  take part in your care. Wishing you the best!  Chonte Ricke, DO 06/10/2024, 8:02 AM [facility]

## 2024-06-12 ENCOUNTER — Ambulatory Visit

## 2024-06-12 ENCOUNTER — Other Ambulatory Visit: Payer: Self-pay

## 2024-06-12 VITALS — BP 112/72 | HR 98 | Temp 97.8°F | Ht 73.0 in | Wt 179.2 lb

## 2024-06-12 DIAGNOSIS — L03115 Cellulitis of right lower limb: Secondary | ICD-10-CM

## 2024-06-12 DIAGNOSIS — E1169 Type 2 diabetes mellitus with other specified complication: Secondary | ICD-10-CM

## 2024-06-12 DIAGNOSIS — S81812D Laceration without foreign body, left lower leg, subsequent encounter: Secondary | ICD-10-CM | POA: Diagnosis not present

## 2024-06-12 DIAGNOSIS — Z4802 Encounter for removal of sutures: Secondary | ICD-10-CM | POA: Diagnosis not present

## 2024-06-12 DIAGNOSIS — Z7984 Long term (current) use of oral hypoglycemic drugs: Secondary | ICD-10-CM | POA: Diagnosis not present

## 2024-06-12 DIAGNOSIS — E785 Hyperlipidemia, unspecified: Secondary | ICD-10-CM

## 2024-06-12 DIAGNOSIS — E119 Type 2 diabetes mellitus without complications: Secondary | ICD-10-CM | POA: Diagnosis not present

## 2024-06-12 DIAGNOSIS — E11628 Type 2 diabetes mellitus with other skin complications: Secondary | ICD-10-CM | POA: Diagnosis not present

## 2024-06-12 LAB — GLUCOSE, CAPILLARY: Glucose-Capillary: 110 mg/dL — ABNORMAL HIGH (ref 70–99)

## 2024-06-12 LAB — POCT GLYCOSYLATED HEMOGLOBIN (HGB A1C): HbA1c, POC (controlled diabetic range): 6.3 % (ref 0.0–7.0)

## 2024-06-12 MED ORDER — SULFAMETHOXAZOLE-TRIMETHOPRIM 800-160 MG PO TABS
1.0000 | ORAL_TABLET | Freq: Two times a day (BID) | ORAL | 0 refills | Status: AC
  Filled 2024-06-12: qty 14, 7d supply, fill #0

## 2024-06-12 NOTE — Progress Notes (Signed)
 Internal Medicine Clinic Attending  I was physically present during the key portions of the resident provided service and participated in the medical decision making of patient's management care. I reviewed pertinent patient test results.  The assessment, diagnosis, and plan were formulated together and I agree with the documentation in the resident's note.  R leg wound examined, healthy pink granulation tissue can be seen in exposed wound, some mild erythema/warmth with exquisite TTP along the distal end of the wound concerning for a developing cellulitis. I am primarily concerned about this wound because of its location along the medial portion of his leg and its proximity to the tibial shaft. Would prefer to preemptively treat any concern for infection that could risk seeding the underlying bone. We emphasized good wound care techniques including keeping the wound clean with water & soap, not using any drying agents such as alcohol, keeping the wound moist with vaseline and covered with a clean guaze dressing. Extensive guidance was provided regarding tobacco cessation for wound healing as well as continued management of his DM which he is doing well.  Shawn Sick, MD

## 2024-06-12 NOTE — Patient Instructions (Addendum)
 Thank you, Ruben Pierce for allowing us  to provide your care today. Today we discussed the wound on your leg, your diabetes, and your cholesterol.  New medications: - Bactrim , 1 pill in the morning and 1 with dinner for the next 7 days  I have ordered the following labs for you:  Lab Orders         Glucose, capillary         POC Hbg A1C       I will call if any are abnormal. All of your labs can be accessed through My Chart.   My Chart Access: [email or web address]  Please follow-up on Monday 9/15-9/16 for check up of laceration and improvement of cellulitis    We look forward to seeing you next time. Please call our clinic at [phone] if you have any questions or concerns. The best time to call is Monday-Friday from 9am-4pm, but there is someone available 24/7. If after hours or the weekend, call the main hospital number and ask for the Internal Medicine Resident On-Call. If you need medication refills, please notify your pharmacy one week in advance and they will send us  a request.   Thank you for letting us  take part in your care. Wishing you the best!  Saif Peter, DO 06/12/2024, 9:23 AM [facility]
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
## 2024-06-13 DIAGNOSIS — Z419 Encounter for procedure for purposes other than remedying health state, unspecified: Secondary | ICD-10-CM | POA: Diagnosis not present

## 2024-06-16 ENCOUNTER — Other Ambulatory Visit: Payer: Self-pay

## 2024-06-16 ENCOUNTER — Ambulatory Visit: Admitting: Student

## 2024-06-16 ENCOUNTER — Encounter: Payer: Self-pay | Admitting: Student

## 2024-06-16 VITALS — BP 116/97 | HR 98 | Temp 97.8°F | Ht 73.0 in | Wt 172.8 lb

## 2024-06-16 DIAGNOSIS — S81812D Laceration without foreign body, left lower leg, subsequent encounter: Secondary | ICD-10-CM | POA: Diagnosis not present

## 2024-06-16 DIAGNOSIS — S81812S Laceration without foreign body, left lower leg, sequela: Secondary | ICD-10-CM

## 2024-06-16 DIAGNOSIS — Z23 Encounter for immunization: Secondary | ICD-10-CM

## 2024-06-16 NOTE — Patient Instructions (Addendum)
 Thank you, Ruben Pierce for allowing us  to provide your care today. Today we discussed:  - Continue wound care   I will call if any are abnormal. All of your labs can be accessed through My Chart.   My Chart Access: [email or web address]  Please follow-up in:    We look forward to seeing you next time. Please call our clinic at [phone] if you have any questions or concerns. The best time to call is Monday-Friday from 9am-4pm, but there is someone available 24/7. If after hours or the weekend, call the main hospital number and ask for the Internal Medicine Resident On-Call. If you need medication refills, please notify your pharmacy one week in advance and they will send us  a request.   Thank you for letting us  take part in your care. Wishing you the best!  Heddy Barren, DO 06/16/2024, 2:24 PM [facility]
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
## 2024-06-18 DIAGNOSIS — S81812S Laceration without foreign body, left lower leg, sequela: Secondary | ICD-10-CM | POA: Insufficient documentation
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
## 2024-06-23 NOTE — Progress Notes (Signed)
 Internal Medicine Clinic Attending  Case discussed with the resident at the time of the visit.  We reviewed the resident's history and exam and pertinent patient test results.  I agree with the assessment, diagnosis, and plan of care documented in the resident's note.

## 2024-07-02 ENCOUNTER — Telehealth: Payer: Self-pay

## 2024-07-02 NOTE — Telephone Encounter (Signed)
 Prior Authorization for patient (Invokana  300MG  tablets) came through on cover my meds was submitted with last office notes and labs awaiting approval or denial.  XZB:[id number]

## 2024-07-02 NOTE — Telephone Encounter (Signed)
 Jaycub Jeziorski (Key: [id number]) PA Case ID #: [id number] Rx #: [id number] Need Help? Call us  at [phone] Outcome Approved today by [facility] Medicaid 2017 Approved. This drug has been approved. Approved quantity: 90 tablets per 90 day(s). You may fill up to a 34 day supply at a retail pharmacy. You may fill up to a 90 day supply for maintenance drugs, please refer to the formulary for details. Please call the pharmacy to process your prescription claim. Effective Date: 07/02/2024 Authorization Expiration Date: 07/02/2025 Drug Invokana  300MG  tablets ePA cloud logo Form [employer] [place]  Electronic Prior Authorization Request Form (2017 NCPDP) Original Claim Info 75 NON-PREFERRED AGENT. IF PREFERRED ALTERNATIVE CANNOT BE USED, PA RQRD. PLEASE CALLNON-PREFERRED AGENT. IF PREFERRED ALTERNATIVE CANNOT BE USED, PA RQRD. PLEASE CALLSubmit 3 DS For Emerg Fill with PA Type01, PA Number [id number], Level of Service 3

## 2024-07-04 ENCOUNTER — Other Ambulatory Visit: Payer: Self-pay | Admitting: Student

## 2024-07-04 DIAGNOSIS — F17200 Nicotine dependence, unspecified, uncomplicated: Secondary | ICD-10-CM

## 2024-07-04 DIAGNOSIS — J449 Chronic obstructive pulmonary disease, unspecified: Secondary | ICD-10-CM

## 2024-07-12 ENCOUNTER — Other Ambulatory Visit: Payer: Self-pay | Admitting: Student

## 2024-07-13 DIAGNOSIS — Z419 Encounter for procedure for purposes other than remedying health state, unspecified: Secondary | ICD-10-CM | POA: Diagnosis not present

## 2024-09-01 ENCOUNTER — Other Ambulatory Visit: Payer: Self-pay | Admitting: Student

## 2024-09-01 DIAGNOSIS — F17200 Nicotine dependence, unspecified, uncomplicated: Secondary | ICD-10-CM

## 2024-09-01 DIAGNOSIS — J449 Chronic obstructive pulmonary disease, unspecified: Secondary | ICD-10-CM

## 2024-09-01 DIAGNOSIS — G4700 Insomnia, unspecified: Secondary | ICD-10-CM

## 2024-09-12 ENCOUNTER — Ambulatory Visit

## 2024-09-12 NOTE — Progress Notes (Deleted)
 Patient name: Ruben Pierce Date of birth: 12/14/1965 Date of visit: 09/12/2024  Type of visit: Established Patient Office Visit   Subjective   Chief concern: No chief complaint on file.   Ruben Pierce is a 58 y.o. male with a history of HTN, DMII, HLD, GERD, TBI, COPD, stable angina, insomnia, and tobacco use who presents to [facility] clinic {imcrsn:33113}  Patient was last seen in the [facility] on 06/16/2024 and 06/12/2024 for lower leg laceration and diabetes follow up. A1c from 06/2024 was 6.3, improved from 6.6 in 01/2024. He takes Invokana  300 mg daily and Metformin  1000 mg BID.     ***ROS: Denies headaches, dizziness, fever, chills, runny nose, sore throat, vision changes, hearing changes, chest pain, shortness of breath, difficulty breathing, nausea, vomiting, abdominal pain. Denies increased urinary frequency, pain with urination, constipation or diarrhea. No recent falls.   Patient Active Problem List   Diagnosis Date Noted   Leg laceration, left, sequela 06/18/2024   Weight loss 02/23/2024   Osteoarthritis of knee 01/17/2024   Alcohol use disorder 05/03/2023   Iron deficiency 12/08/2020   Erectile dysfunction 09/15/2020   Health care maintenance 05/24/2016   GERD (gastroesophageal reflux disease) 05/24/2016   Stable angina 01/24/2016   COPD (chronic obstructive pulmonary disease) (HCC) 01/24/2016   Current smoker 01/24/2016   Hyperlipidemia associated with type 2 diabetes mellitus (HCC) 11/11/2015   Insomnia 11/11/2015   Type 2 diabetes mellitus (HCC) 11/08/2015   Hypertension associated with diabetes (HCC) 11/08/2015   TBI (traumatic brain injury) [facility])      Past Surgical History:  Procedure Laterality Date   BRAIN SURGERY  1999   Head injury, fell from tree 25 feet   BUNIONECTOMY Right 2014     Current Outpatient Medications  Medication Instructions   acetaminophen  (TYLENOL ) 1,000 mg, Oral, Every 8 hours PRN   amitriptyline  (ELAVIL ) 25 mg, Oral, Daily at bedtime    amLODipine  (NORVASC ) 10 mg, Oral, Daily   Artificial Tears ophthalmic solution 1 drop, Right Eye, Every hour   atorvastatin  (LIPITOR) 80 mg, Oral, Daily   canagliflozin  (INVOKANA ) 300 MG TABS tablet TAKE ONE TABLET BY MOUTH EVERY DAY ** BEFORE breakfast **   diclofenac  Sodium (VOLTAREN ) 1 % GEL APPLY FOUR grams topicallt FOUR TIMES DAILY   enalapril  (VASOTEC ) 20 mg, Oral, Daily   famotidine  (PEPCID ) 20 mg, Oral, 2 times daily   metFORMIN  (GLUCOPHAGE ) 1,000 mg, Oral, 2 times daily with meals   Multiple Vitamin tablet 1 tablet, Oral, Daily   mupirocin  ointment (BACTROBAN ) 2 % APPLY TO THE AFFECTED AREA(S) EVERY DAY   naltrexone  (DEPADE) 50 mg, Oral, Daily   pantoprazole  (PROTONIX ) 40 mg, Oral, Daily   thiamine  (VITAMIN B1) 100 mg, Oral, Daily   Tiotropium Bromide-Olodaterol (STIOLTO RESPIMAT ) 2.5-2.5 MCG/ACT AERS inhale two puffs into THE lung daily   traZODone  (DESYREL ) 50 MG tablet TAKE 1/2 TABLET (25mg ) BY MOUTH AT BEDTIME   valACYclovir  (VALTREX ) 1,000 mg, Oral, 3 times daily   varenicline  (CHANTIX ) 1 MG tablet TAKE ONE TABLET (1mg  total) BY MOUTH TWICE DAILY    Social History[1]    Objective  There were no vitals filed for this visit.There is no height or weight on file to calculate BMI.   Physical Exam:   Constitutional: well-appearing *** sitting in exam chair, in no acute distress. Ambulates without use of assistance device *** HEENT: normocephalic atraumatic, mucous membranes moist Eyes: conjunctiva non-erythematous Neck: supple Cardiovascular: regular rate and rhythm, bilateral radial pulses 2+, bilateral dorsal pedal pulses 2+,  brisk capillary refill bilateral feet and hands  Pulmonary/Chest: normal work of breathing on room air, lungs clear to auscultation bilaterally Abdominal: soft, non-tender, non-distended MSK: normal bulk and tone. Neurological: alert & oriented x 3, sensation intact bilateral feet to monofilament*** Skin: warm and dry, no ulcers or lesions on  bilateral feet*** Psych: mood calm, behavior normal, thought content normal, judgement normal    {Labs (Optional):23779}  The 10-year ASCVD risk score (Arnett DK, et al., 2019) is: 17.6%   Values used to calculate the score:     Age: 20 years     Clinically relevant sex: Male     Is Non-Hispanic African American: No     Diabetic: Yes     Tobacco smoker: Yes     Systolic Blood Pressure: 116 mmHg     Is BP treated: Yes     HDL Cholesterol: 49 mg/dL     Total Cholesterol: 165 mg/dL      Assessment & Plan  Problem List Items Addressed This Visit   None   No follow-ups on file.  Patient discussed with Dr. {imcattendings:33109}, who also saw and evaluated the patient.  Doyal Miyamoto, MD [facility]  PGY-1 09/12/2024, 9:51 AM      [1]  Social History Tobacco Use   Smoking status: Every Day    Current packs/day: 0.75    Average packs/day: 0.8 packs/day for 43.9 years (33.0 ttl pk-yrs)    Types: Cigarettes    Start date: 1982   Smokeless tobacco: Never   Tobacco comments:    Range from 0.5-1 PPD with intermittent short periods of abstinence.   Vaping Use   Vaping status: Never Used  Substance Use Topics   Alcohol use: Yes    Alcohol/week: 12.0 standard drinks of alcohol    Types: 12 Cans of beer per week   Drug use: Yes    Types: Marijuana    Comment: Rarely.

## 2024-09-15 ENCOUNTER — Ambulatory Visit: Admitting: Student

## 2024-09-15 ENCOUNTER — Ambulatory Visit: Payer: Self-pay

## 2024-09-15 VITALS — BP 133/90 | HR 97 | Temp 97.7°F | Wt 187.4 lb

## 2024-09-15 DIAGNOSIS — S069X9S Unspecified intracranial injury with loss of consciousness of unspecified duration, sequela: Secondary | ICD-10-CM

## 2024-09-15 DIAGNOSIS — G44219 Episodic tension-type headache, not intractable: Secondary | ICD-10-CM

## 2024-09-15 DIAGNOSIS — Z7984 Long term (current) use of oral hypoglycemic drugs: Secondary | ICD-10-CM

## 2024-09-15 DIAGNOSIS — Z79899 Other long term (current) drug therapy: Secondary | ICD-10-CM | POA: Diagnosis not present

## 2024-09-15 DIAGNOSIS — E1142 Type 2 diabetes mellitus with diabetic polyneuropathy: Secondary | ICD-10-CM

## 2024-09-15 DIAGNOSIS — G4700 Insomnia, unspecified: Secondary | ICD-10-CM | POA: Diagnosis not present

## 2024-09-15 DIAGNOSIS — J449 Chronic obstructive pulmonary disease, unspecified: Secondary | ICD-10-CM

## 2024-09-15 NOTE — Patient Instructions (Addendum)
 Thank you, [name], for allowing us  to provide your care today. Today we discussed . . .  > Medications       - Since there is some confusion with her medications I would like to talk to Ruben Pierce about all of your medications before reordering any like your sleeping medicine.  I will talk to her this week and we will make a plan from there.  Please continue to take your medications as you have been and it is important that you take your inhaler every day as prescribed.  We have checked your A1c today and if it is changing we will talk about it on the phone when I call Ruben Pierce.  For your headaches I would like you to take Tylenol  1000 mg which is 2 extra strength tablets as needed for these headaches.  You can take this medication every 8 hours.  I would try to avoid medicines like ibuprofen  or Goody powder because they can cause stomach ulcers and bleeding over time.   I have ordered the following labs for you:   Lab Orders         Hemoglobin A1c       Follow up: 2-3 months    Remember:  Should you have any questions or concerns please call the internal medicine clinic at [phone].     Ruben Pool, DO [facility]

## 2024-09-15 NOTE — Assessment & Plan Note (Signed)
 Last A1c was 6.3 on 06/12/2024.  He is doing well on current regimen and we will not make any changes today. - A1c today - Continue Invokana  300 mg daily and metformin  1000 mg twice daily
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
## 2024-09-15 NOTE — Assessment & Plan Note (Signed)
 On exam today the patient has diffuse wheezing.  He is not taking his Stiolto inhaler daily but he does have it with him and is agreeable to start taking this daily.  He is not having any shortness of breath.  He does continue to smoke infrequently. - Continue Stiolto, encouraged to use as prescribed daily

## 2024-09-15 NOTE — Assessment & Plan Note (Signed)
 Today he would like to talk mostly about his sleeping medicines.  He ran out of amitriptyline  a few months ago and is unsure if he is taking trazodone  or not.  Chart review shows that he has been on amitriptyline  for many years and had previously been on doses up to 200 mg daily.  He was recently on 25 mg daily.  As below we would like to do a comprehensive review of his medications before changing or refilling any medications today.  I would also like to revisit his insomnia and make sure that he has good sleep hygiene and possibly try ramelteon or doxepin and increasing trazodone  before represcribing amitriptyline .

## 2024-09-16 LAB — HEMOGLOBIN A1C
Est. average glucose Bld gHb Est-mCnc: 143 mg/dL
Hgb A1c MFr Bld: 6.6 % — ABNORMAL HIGH (ref 4.8–5.6)

## 2024-09-17 NOTE — Progress Notes (Signed)
 Internal Medicine Clinic Attending  Case discussed with the resident at the time of the visit.  We reviewed the resident's history and exam and pertinent patient test results.  I agree with the assessment, diagnosis, and plan of care documented in the resident's note.

## 2024-09-30 ENCOUNTER — Telehealth: Payer: Self-pay

## 2024-09-30 NOTE — Progress Notes (Unsigned)
 Care Guide Pharmacy Note  09/30/2024 Name: Ruben Pierce MRN: [id number] DOB: 03/24/1966  Referred By: Jolaine Pac, DO Reason for referral: Complex Care Management and Call Attempt #1 (Unsuccessful initial outreach to schedule with PHARM D- [place])   Ruben Pierce is a 58 y.o. year old male who is a primary care patient of Jolaine Pac, DO.  Ruben Pierce was referred to the pharmacist for assistance related to: TBI  An unsuccessful telephone outreach was attempted today to contact the patient who was referred to the pharmacy team for assistance with medication assistance. Additional attempts will be made to contact the patient.  Ruben Pierce [facility], [facility] Guide  Direct Dial: [phone]  Fax [phone]

## 2024-10-01 NOTE — Progress Notes (Unsigned)
 Care Guide Pharmacy Note  10/01/2024 Name: Ruben Pierce MRN: [id number] DOB: [birth year]  Referred By: Jolaine Pac, DO Reason for referral: Complex Care Management, Call Attempt #1 (Unsuccessful initial outreach to schedule with PHARM DGLENWOOD Bun), and Call Attempt #2 (Unsuccessful initial outreach to schedule with PHARM D- [place]/)   Ruben Pierce is a 58 y.o. year old male who is a primary care patient of Jolaine Pac, DO.  Ruben Pierce was referred to the pharmacist for assistance related to: TBI  A second unsuccessful telephone outreach was attempted today to contact the patient who was referred to the pharmacy team for assistance with medication assistance. Additional attempts will be made to contact the patient.  Ruben Pierce, [facility] Guide  Direct Dial: [phone]  Fax [phone]

## 2024-10-03 NOTE — Progress Notes (Signed)
 Care Guide Pharmacy Note  10/03/2024 Name: Ruben Pierce MRN: [id number] DOB: May 23, 1966  Referred By: Jolaine Pac, DO Reason for referral: Complex Care Management, Call Attempt #1 (Unsuccessful initial outreach to schedule with PHARM DGLENWOOD Bun), Call Attempt #2 (Unsuccessful initial outreach to schedule with PHARM D- [place]/), and Call Attempt #3 (Unsuccessful initial outreach to schedule with PHARM D- [place]/)   Ruben Pierce is a 59 y.o. year old male who is a primary care patient of Jolaine Pac, DO.  Ruben Pierce was referred to the pharmacist for assistance related to: TBI  Successful contact was made with the patient to discuss pharmacy services including being ready for the pharmacist to call at least 5 minutes before the scheduled appointment time and to have medication bottles and any blood pressure readings ready for review. The patient agreed to meet with the pharmacist via In office on (date/time). 11/19/24 @ 9 AM  Ruben Pierce Ruben Pierce, [facility] Guide  Direct Dial: [phone]  Fax [phone]

## 2024-10-06 ENCOUNTER — Other Ambulatory Visit: Payer: Self-pay | Admitting: Student

## 2024-10-28 ENCOUNTER — Other Ambulatory Visit: Payer: Self-pay | Admitting: Student

## 2024-10-28 DIAGNOSIS — F17200 Nicotine dependence, unspecified, uncomplicated: Secondary | ICD-10-CM

## 2024-11-19 ENCOUNTER — Ambulatory Visit: Payer: Self-pay

## 2024-12-05 ENCOUNTER — Ambulatory Visit: Payer: Self-pay
